# Patient Record
Sex: Male | Born: 1940 | Race: White | Hispanic: No | Marital: Single | Smoking: Never smoker
Health system: Southern US, Community
[De-identification: ages and names within clinical notes are randomized; demographics above are authoritative.]

## PROBLEM LIST (undated history)

## (undated) ENCOUNTER — Emergency Department (HOSPITAL_COMMUNITY): Payer: PPO | Source: Home / Self Care

## (undated) ENCOUNTER — Inpatient Hospital Stay: Admission: EM | Payer: Self-pay | Source: Home / Self Care

## (undated) DIAGNOSIS — R479 Unspecified speech disturbances: Secondary | ICD-10-CM

## (undated) DIAGNOSIS — I10 Essential (primary) hypertension: Secondary | ICD-10-CM

## (undated) DIAGNOSIS — E039 Hypothyroidism, unspecified: Secondary | ICD-10-CM

## (undated) DIAGNOSIS — D494 Neoplasm of unspecified behavior of bladder: Secondary | ICD-10-CM

## (undated) DIAGNOSIS — G479 Sleep disorder, unspecified: Secondary | ICD-10-CM

## (undated) DIAGNOSIS — K219 Gastro-esophageal reflux disease without esophagitis: Secondary | ICD-10-CM

## (undated) DIAGNOSIS — C801 Malignant (primary) neoplasm, unspecified: Secondary | ICD-10-CM

## (undated) DIAGNOSIS — E7800 Pure hypercholesterolemia, unspecified: Secondary | ICD-10-CM

## (undated) DIAGNOSIS — R39198 Other difficulties with micturition: Secondary | ICD-10-CM

## (undated) DIAGNOSIS — G4730 Sleep apnea, unspecified: Secondary | ICD-10-CM

---

## 1979-03-14 HISTORY — PX: HERNIA REPAIR: SHX51

## 1989-03-13 HISTORY — PX: RADIOACTIVE SEED IMPLANT: SHX5150

## 2000-07-12 ENCOUNTER — Other Ambulatory Visit: Admission: RE | Admit: 2000-07-12 | Discharge: 2000-07-12 | Payer: Self-pay | Admitting: Urology

## 2000-07-12 ENCOUNTER — Encounter (INDEPENDENT_AMBULATORY_CARE_PROVIDER_SITE_OTHER): Payer: Self-pay | Admitting: Specialist

## 2000-08-03 ENCOUNTER — Encounter: Admission: RE | Admit: 2000-08-03 | Discharge: 2000-11-01 | Payer: Self-pay | Admitting: Radiation Oncology

## 2000-09-06 ENCOUNTER — Encounter: Payer: Self-pay | Admitting: Radiation Oncology

## 2000-10-26 ENCOUNTER — Encounter: Payer: Self-pay | Admitting: Urology

## 2000-10-26 ENCOUNTER — Ambulatory Visit (HOSPITAL_BASED_OUTPATIENT_CLINIC_OR_DEPARTMENT_OTHER): Admission: RE | Admit: 2000-10-26 | Discharge: 2000-10-26 | Payer: Self-pay | Admitting: Urology

## 2000-12-03 ENCOUNTER — Ambulatory Visit (HOSPITAL_COMMUNITY): Admission: RE | Admit: 2000-12-03 | Discharge: 2000-12-03 | Payer: Self-pay | Admitting: Radiation Oncology

## 2001-04-12 ENCOUNTER — Ambulatory Visit: Admission: RE | Admit: 2001-04-12 | Discharge: 2001-07-11 | Payer: Self-pay | Admitting: Radiation Oncology

## 2009-09-20 ENCOUNTER — Encounter (INDEPENDENT_AMBULATORY_CARE_PROVIDER_SITE_OTHER): Payer: Self-pay | Admitting: *Deleted

## 2009-09-30 ENCOUNTER — Encounter (INDEPENDENT_AMBULATORY_CARE_PROVIDER_SITE_OTHER): Payer: Self-pay

## 2009-10-01 ENCOUNTER — Ambulatory Visit: Payer: Self-pay | Admitting: Gastroenterology

## 2009-10-04 ENCOUNTER — Telehealth (INDEPENDENT_AMBULATORY_CARE_PROVIDER_SITE_OTHER): Payer: Self-pay | Admitting: *Deleted

## 2010-07-13 HISTORY — PX: OTHER SURGICAL HISTORY: SHX169

## 2010-08-12 NOTE — Miscellaneous (Signed)
Summary: Lec previsit  Clinical Lists Changes  Medications: Added new medication of MOVIPREP 100 GM  SOLR (PEG-KCL-NACL-NASULF-NA ASC-C) As per prep instructions. - Signed Rx of MOVIPREP 100 GM  SOLR (PEG-KCL-NACL-NASULF-NA ASC-C) As per prep instructions.;  #1 x 0;  Signed;  Entered by: Ulis Rias RN;  Authorized by: Louis Meckel MD;  Method used: Electronically to [facility] [id number]*, [address] [place], [place]  [address], Ph: [phone], Fax: [phone] Observations: Added new observation of NKA: T (10/01/2009 14:27)    Prescriptions: MOVIPREP 100 GM  SOLR (PEG-KCL-NACL-NASULF-NA ASC-C) As per prep instructions.  #1 x 0   Entered by:   Ulis Rias RN   Authorized by:   Louis Meckel MD   Signed by:   Ulis Rias RN on 10/01/2009   Method used:   Electronically to        [facility] [phone]* (retail)       [address]       [place], [place]  [address]       Ph: [phone]       Fax: [phone]   RxID:   [id number]

## 2010-08-12 NOTE — Letter (Signed)
Summary: [facility] Instructions  [facility]  [address] [place], [place] [address]   Phone: [phone]  Fax: [phone]       Joe Park    1940/08/31    MRN: [id number]        Procedure Day /Date:  Thursday 10/17/2009     Arrival Time: 9:30 am      Procedure Time:  10:30 am     Location of Procedure:                    _x _   [facility]oscopy Center (4th Floor)                        PREPARATION FOR COLONOSCOPY WITH MOVIPREP   Starting 5 days prior to your procedure Saturday 4/2 do not eat nuts, seeds, popcorn, corn, beans, peas,  salads, or any raw vegetables.  Do not take any fiber supplements (e.g. Metamucil, Citrucel, and Benefiber).  THE DAY BEFORE YOUR PROCEDURE         DATE: Wednesday 4/6  1.  Drink clear liquids the entire day-NO SOLID FOOD  2.  Do not drink anything colored red or purple.  Avoid juices with pulp.  No orange juice.  3.  Drink at least 64 oz. (8 glasses) of fluid/clear liquids during the day to prevent dehydration and help the prep work efficiently.  CLEAR LIQUIDS INCLUDE: Water Jello Ice Popsicles Tea (sugar ok, no milk/cream) Powdered fruit flavored drinks Coffee (sugar ok, no milk/cream) Gatorade Juice: apple, white grape, white cranberry  Lemonade Clear bullion, consomm, broth Carbonated beverages (any kind) Strained chicken noodle soup Hard Candy                             4.  In the morning, mix first dose of MoviPrep solution:    Empty 1 Pouch A and 1 Pouch B into the disposable container    Add lukewarm drinking water to the top line of the container. Mix to dissolve    Refrigerate (mixed solution should be used within 24 hrs)  5.  Begin drinking the prep at 5:00 p.m. The MoviPrep container is divided by 4 marks.   Every 15 minutes drink the solution down to the next mark (approximately 8 oz) until the full liter is complete.   6.  Follow completed prep with 16 oz of clear liquid of your choice  (Nothing red or purple).  Continue to drink clear liquids until bedtime.  7.  Before going to bed, mix second dose of MoviPrep solution:    Empty 1 Pouch A and 1 Pouch B into the disposable container    Add lukewarm drinking water to the top line of the container. Mix to dissolve    Refrigerate  THE DAY OF YOUR PROCEDURE      DATE: Thursday 4/7  Beginning at 5:30 am (5 hours before procedure):         1. Every 15 minutes, drink the solution down to the next mark (approx 8 oz) until the full liter is complete.  2. Follow completed prep with 16 oz. of clear liquid of your choice.    3. You may drink clear liquids until 8:30 am (2 HOURS BEFORE PROCEDURE).   MEDICATION INSTRUCTIONS  Unless otherwise instructed, you should take regular prescription medications with a small sip of water   as early as possible the morning  of your procedure.  Additional medication instructions: Do not take HCTZ am of procedure.         OTHER INSTRUCTIONS  You will need a responsible adult at least 69 years of age to accompany you and drive you home.   This person must remain in the waiting room during your procedure.  Wear loose fitting clothing that is easily removed.  Leave jewelry and other valuables at home.  However, you may wish to bring a book to read or  an iPod/MP3 player to listen to music as you wait for your procedure to start.  Remove all body piercing jewelry and leave at home.  Total time from sign-in until discharge is approximately 2-3 hours.  You should go home directly after your procedure and rest.  You can resume normal activities the  day after your procedure.  The day of your procedure you should not:   Drive   Make legal decisions   Operate machinery   Drink alcohol   Return to work  You will receive specific instructions about eating, activities and medications before you leave.    The above instructions have been reviewed and explained to me by    Ulis Rias RN  October 01, 2009 3:11 PM     I fully understand and can verbalize these instructions _____________________________ Date _________

## 2010-08-12 NOTE — Progress Notes (Signed)
Summary: prep ?  Phone Note Call from Patient Call back at [phone]   Caller: sister-in-law, Joyce Gross Call For: Dr. Arlyce Dice Reason for Call: Talk to Nurse Summary of Call: pt cannot afford prep Initial call taken by: Vallarie Mare,  October 04, 2009 3:27 PM  Follow-up for Phone Call        offered rebate for moviprep.  Sister in law okay with that.  Pt is considering rescheduling colonoscopy because caregiver is sick.  Will call back. If colonoscopy is not cancelled, will mail rebate to pt. Follow-up by: Ezra Sites RN,  October 04, 2009 4:36 PM

## 2010-08-12 NOTE — Letter (Signed)
Summary: Previsit letter  [facility]  [address] [place], [place] [address]   Phone: [phone]  Fax: [phone]       09/20/2009 MRN: [id number]  Joe Park [address] [place], [place]  [address]  Dear Mr. MUSA,  Welcome to the Gastroenterology Division at [facility].    You are scheduled to see a nurse for your pre-procedure visit on 10-01-09 at 3PM on the 3rd floor at [facility], [address]  We ask that you try to arrive at our office 15 minutes prior to your appointment time to allow for check-in.  Your nurse visit will consist of discussing your medical and surgical history, your immediate family medical history, and your medications.    Please bring a complete list of all your medications or, if you prefer, bring the medication bottles and we will list them.  We will need to be aware of both prescribed and over the counter drugs.  We will need to know exact dosage information as well.  If you are on blood thinners (Coumadin, Plavix, Aggrenox, Ticlid, etc.) please call our office today/prior to your appointment, as we need to consult with your physician about holding your medication.   Please be prepared to read and sign documents such as consent forms, a financial agreement, and acknowledgement forms.  If necessary, and with your consent, a friend or relative is welcome to sit-in on the nurse visit with you.  Please bring your insurance card so that we may make a copy of it.  If your insurance requires a referral to see a specialist, please bring your referral form from your primary care physician.  No co-pay is required for this nurse visit.     If you cannot keep your appointment, please call [phone] to cancel or reschedule prior to your appointment date.  This allows [place] the opportunity to schedule an appointment for another patient in need of care.    Thank you for choosing [facility] for your medical needs.  We  appreciate the opportunity to care for you.  Please visit [place] at our website  to learn more about our practice.                     Sincerely.                                                                                                                   The Gastroenterology Division

## 2010-11-28 NOTE — Op Note (Signed)
[name]  Patient:    Joe Park, Joe Park                    MRN: [id number] Proc. Date: 10/26/00 Attending:  Maretta Bees. Vonita Moss, M.D. CC:         Wynn Banker, M.D.  Juluis Mire, M.D.   Operative Report  PREOPERATIVE DIAGNOSIS:  Carcinoma of the prostate.  POSTOPERATIVE DIAGNOSIS:  Carcinoma of the prostate.  PROCEDURE:  Radioactive seed implantation to the prostate and cystoscopy.  SURGEON:  Maretta Bees. Vonita Moss, M.D.  ASSISTANT:  Wynn Banker, M.D.  ANESTHESIA:  General.  INDICATIONS:  This 70 year old gentleman was found to have a PSA of 5.5, and he underwent a needle biopsy of the prostate at the end of December that showed Gleason grade 7 (3+4) in the left lobe of the prostate.  He was counseled about surgery versus radiation therapy, and he elected for radiotherapy as a treatment modality.  He has undergone external beam radiation, now presents for radioactive seed implantation.  DESCRIPTION OF PROCEDURE:  The patient was brought to the operating room and placed in lithotomy position.  The external genitalia and perineum were prepped and draped in the usual fashion.  A Foley catheter and rectal tubes were inserted.  The transrectal ultrasound probe was inserted and images correlated with the simulation ultrasound and treatment planning schema. Stabilizing needles were placed in good position, and then under ultrasound and fluoroscopic control, a total of 74 seeds were implanted utilizing 19 needles.  The seeds were I-125 with 0.238 mCi per seed.  Fluoroscopic and ultrasonic visualization revealed a good-appearing seed distribution and implant.  Foley was then removed and he was cystoscoped, and there was no evidence of seeds in the urethra or bladder.  The Foley catheter was reinserted and the patient taken to the recovery room in good condition after bandaging the perineum.  He tolerated the procedure well. DD:   10/26/00 TD:  10/26/00 Job: [id number]

## 2011-09-07 ENCOUNTER — Other Ambulatory Visit: Payer: Self-pay | Admitting: Urology

## 2011-09-15 ENCOUNTER — Encounter (HOSPITAL_COMMUNITY): Payer: Self-pay | Admitting: Pharmacy Technician

## 2011-09-17 ENCOUNTER — Other Ambulatory Visit: Payer: Self-pay

## 2011-09-17 ENCOUNTER — Ambulatory Visit (HOSPITAL_COMMUNITY)
Admission: RE | Admit: 2011-09-17 | Discharge: 2011-09-17 | Disposition: A | Discharge status: Home / Self Care | Payer: Medicare Other | Source: Ambulatory Visit | Attending: Urology | Admitting: Urology

## 2011-09-17 ENCOUNTER — Encounter (HOSPITAL_COMMUNITY)
Admission: RE | Admit: 2011-09-17 | Discharge: 2011-09-17 | Disposition: A | Discharge status: Home / Self Care | Payer: Medicare Other | Source: Ambulatory Visit | Attending: Urology | Admitting: Urology

## 2011-09-17 ENCOUNTER — Encounter (HOSPITAL_COMMUNITY): Payer: Self-pay

## 2011-09-17 DIAGNOSIS — Z01812 Encounter for preprocedural laboratory examination: Secondary | ICD-10-CM | POA: Insufficient documentation

## 2011-09-17 DIAGNOSIS — Z01810 Encounter for preprocedural cardiovascular examination: Secondary | ICD-10-CM | POA: Insufficient documentation

## 2011-09-17 DIAGNOSIS — Z01818 Encounter for other preprocedural examination: Secondary | ICD-10-CM | POA: Insufficient documentation

## 2011-09-17 HISTORY — DX: Other difficulties with micturition: R39.198

## 2011-09-17 HISTORY — DX: Pure hypercholesterolemia, unspecified: E78.00

## 2011-09-17 HISTORY — DX: Gastro-esophageal reflux disease without esophagitis: K21.9

## 2011-09-17 HISTORY — DX: Sleep disorder, unspecified: G47.9

## 2011-09-17 HISTORY — DX: Essential (primary) hypertension: I10

## 2011-09-17 HISTORY — DX: Neoplasm of unspecified behavior of bladder: D49.4

## 2011-09-17 HISTORY — DX: Malignant (primary) neoplasm, unspecified: C80.1

## 2011-09-17 HISTORY — DX: Hypothyroidism, unspecified: E03.9

## 2011-09-17 LAB — CBC
Platelets: 230 10*3/uL (ref 150–400)
RDW: 13.2 % (ref 11.5–15.5)
WBC: 6.8 10*3/uL (ref 4.0–10.5)

## 2011-09-17 LAB — BASIC METABOLIC PANEL
Chloride: 102 mEq/L (ref 96–112)
GFR calc Af Amer: 85 mL/min — ABNORMAL LOW (ref >90)
Potassium: 3.5 mEq/L (ref 3.5–5.1)
Sodium: 139 mEq/L (ref 135–145)

## 2011-09-17 LAB — SURGICAL PCR SCREEN: Staphylococcus aureus: NEGATIVE

## 2011-09-17 NOTE — Patient Instructions (Signed)
20 Joe Park  09/17/2011   Your procedure is scheduled on:  09/22/11  Report to SHORT STAY DEPT  at 6:45 AM.  Call this number if you have problems the morning of surgery: [phone]   Remember:   Do not eat food or drink liquids AFTER MIDNIGHT  Take these medicines the morning of surgery with A SIP OF WATER: LEVOTHYROXINE   Do not wear jewelry, make-up or nail polish.  Do not wear lotions, powders, or perfumes.   Do not shave legs or underarms 48 hrs. before surgery (men may shave face)  Do not bring valuables to the hospital.  Contacts, dentures or bridgework may not be worn into surgery.  Leave suitcase in the car. After surgery it may be brought to your room.  For patients admitted to the hospital, checkout time is 11:00 AM the day of discharge.   Patients discharged the day of surgery will not be allowed to drive home.  Name and phone number of your driver:   Special Instructions:   Please read over the following fact sheets that you were given: MRSA  Information               SHOWER WITH BETASEPT THE NIGHT BEFORE SURGERY AND THE MORNING OF SURGERY

## 2011-09-21 NOTE — H&P (Signed)
Problems  1. 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 Gross Hematuria 599.71 3. 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 History of  Prostate Cancer V10.46 6. Pyuria 791.9 7. Urge Incontinence Of Urine 788.31  History of Present Illness     [name] returns today in f/u.  He has a history of prostate cancer s/p seed implant in 2002.  His PSA has been low.  He had a bladder stone removed in 8/11.  He returned in January with gross hematuria.  He was seen by [facility] Muta and had a negative culture.  He was started on Tamsulosin for his voiding symptoms.   A CT showed possible bladder stones and a diverticulum, but on my  review there was some bladder wall thickening and I am concerned about neoplasm.  He has had no further gross blood over the last 2 weeks but has pyuria today.  He has an intermittantly slow stream but is emptying well with a PVR of only 29cc.   Past Medical History Problems  1. History of  Bladder Calculus V13.01 2. History of  Gross Hematuria 599.71 3. History of  Heartburn 787.1 4. History of  Hypercholesterolemia 272.0 5. History of  Hypertension 401.9 6. History of  Prostate Cancer V10.46 7. History of  Radiation Therapy V15.3  Surgical History Problems  1. History of  Colonoscopy (Fiberoptic) 2. History of  Inguinal Hernia Repair 3. History of  Umbilical Hernia Repair  Current Meds 1. Aspirin 81 MG Oral Tablet; Therapy: (Recorded:30Aug2011) to 2. Hydrochlorothiazide 12.5 MG Oral Capsule; Therapy: (Recorded:30Jan2013) to 3. Levothyroxine Sodium 125 MCG Oral Tablet; Therapy: 27Sep2011 to 4. Lisinopril 40 MG Oral Tablet; Therapy: (Recorded:30Jan2013) to 5. Multi-Vitamin TABS; Therapy: (Recorded:13May2008) to 6. Simvastatin 40 MG Oral Tablet; Therapy: (Recorded:30Jan2013) to 7. Tamsulosin HCl 0.4 MG Oral Capsule; TAKE 1 CAPSULE BY MOUTH  DAILY; Therapy:  30Jan2013 to (Last Rx:30Jan2013)  Requested for: 30Jan2013 8. Vitamin D TABS; Therapy:  (Recorded:30Aug2011) to  Allergies Medication  1. No Known Drug Allergies No Known Allergies  2. No Known Allergies  Family History Problems  1. Maternal history of  Cardiac Failure 2. Paternal history of  Death In The Family Father EmphysemaAge 47 3. Maternal history of  Death In The Family Mother Heart FailureAge 81 4. Paternal history of  Emphysema  Social History Problems  1. Caffeine Use Five per day 2. Marital History - Single 3. Never A Smoker 4. Retired From Work Denied  5. Alcohol Use 6. History of  Tobacco Use   Past, social and family history reviewed and updated.   Review of Systems Genitourinary, constitutional, skin, eye, otolaryngeal, hematologic/lymphatic, cardiovascular, pulmonary, endocrine, musculoskeletal, gastrointestinal, neurological and psychiatric system(s) were reviewed and pertinent findings if present are noted.  Genitourinary: feelings of urinary urgency, dysuria, incontinence (with urge) and weak urinary stream, but no hematuria.  Cardiovascular: no chest pain.  Respiratory: no shortness of breath.    Physical Exam Constitutional: Well nourished and well developed . No acute distress.  ENT:. The ears and nose are normal in appearance.  Neck: The appearance of the neck is normal and no neck mass is present.  Pulmonary: No respiratory distress and normal respiratory rhythm and effort.  Cardiovascular: Heart rate and rhythm are normal . No peripheral edema.  Abdomen: The abdomen is soft and nontender. No masses are palpated. No CVA tenderness. No hernias are palpable. No hepatosplenomegaly noted.  Genitourinary: Examination of the penis demonstrates no discharge, no masses, no lesions and a normal meatus. The  scrotum is without lesions. The right epididymis is palpably normal and non-tender. The left epididymis is palpably normal and non-tender. The right testis is non-tender and without masses. The left testis is non-tender and without masses.    Lymphatics: The supraclavicular and axillary nodes are not enlarged or tender.  Skin: Normal skin turgor, no visible rash and no visible skin lesions.  Neuro/Psych:. Mood and affect are appropriate.    Results/Data Urine [Data Includes: Last 1 Day]   25Feb2013  COLOR YELLOW   APPEARANCE CLOUDY   SPECIFIC GRAVITY 1.020   pH 7.0   GLUCOSE NEG mg/dL  BILIRUBIN NEG   KETONE NEG mg/dL  BLOOD LARGE   PROTEIN 100 mg/dL  UROBILINOGEN 1 mg/dL  NITRITE NEG   LEUKOCYTE ESTERASE MOD   SQUAMOUS EPITHELIAL/HPF RARE   WBC TNTC WBC/hpf  RBC 11-20 RBC/hpf  BACTERIA FEW   CRYSTALS NONE SEEN   CASTS NONE SEEN    The following images/tracing/specimen were independently visualized:  I have reviewed his recent CT films.  The following clinical lab reports were reviewed:  I have reviewed his recent culture and renal function studies.  PVR: Ultrasound PVR 29 ml. Selected Results  AU CT-HEMATURIA PROTOCOL 05Feb2013 12:00AM Jetta Lout   Test Name Result Flag Reference  ** RADIOLOGY REPORT BY [facility] ** ORIGINAL APPROVED BY: Danae Orleans, M.D. ON: 08/18/2011 11:30:32   *RADIOLOGY REPORT*  Clinical Data: Painless gross hematuria. Prostate carcinoma.  CT ABDOMEN AND PELVIS WITHOUT AND WITH CONTRAST  Technique: Multidetector CT imaging of the abdomen and pelvis was performed without contrast material in one or both body regions, followed by contrast material(s) and further sections in one or both body regions.  Contrast: 125 ml Isovue 300  Comparison: 02/17/2010  Findings: No evidence of renal calculi or hydronephrosis. No evidence of ureteral calculi or dilatation.  A tiny sub-centimeter cyst or angiomyolipoma in the anterior mid pole right kidney is stable. Several other tiny sub-centimeter low attenuation renal lesions, which are too small to characterize, are also unchanged. No suspicious solid or complex cystic masses are seen involving either kidney. Delayed  images show no evidence of filling defects within the renal collecting systems or ureters.  The bladder shows several intraluminal calcifications measuring up to 6 mm, consistent with calculi. There is a fluid attenuation cystic lesion seen in the posterior aspect the bladder which measures 4.5 x 4.6 cm. This shows filling with urinary contrast during delayed excretory phase, consistent with some type of bladder cyst.  No solid or enhancing bladder masses are identified. Brachytherapy seeds are seen throughout the prostate gland. Seminal vesicles are unremarkable in appearance.  No soft tissue masses or lymphadenopathy seen within the abdomen or pelvis. The liver, gallbladder, pancreas, adrenal glands, and pancreas are normal in appearance. No evidence of acute inflammatory process or abscess. Mild diverticulosis of the sigmoid colon seen, however there is no evidence of diverticulitis. No suspicious bone lesions are identified. L2 vertebral body superior endplate compression fracture is seen which is new since previous study 2011, but does not appear acute.  IMPRESSION:  1. 4.6 cm fluid attenuation cyst in the posterior urinary bladder, which shows communication with the main bladder lumen on excretory phase. No solid bladder neoplasm identified. Consider cystoscopy for further evaluation. 2. Several small intraluminal bladder calculi measuring up to 6 mm. 3. No evidence of renal cell carcinoma, ureteral calculi, or hydronephrosis. 4. 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 No evidence of bone metastasis.  PSA 30Jan2013 10:25AM Jetta Lout  SPECIMEN TYPE: BLOOD   Test Name Result Flag Reference  PSA 0.02 ng/mL  <=4.00  Test Methodology: ECLIA PSA (Electrochemiluminescence Immunoassay)   BUN & CREATININE 30Jan2013 10:25AM Jetta Lout  SPECIMEN TYPE: BLOOD   Test Name Result Flag Reference  CREATININE 1.09 mg/dL  1.61-0.96  BUN  21 mg/dL  0-45  Est GFR, African American 80 mL/min    Est GFR, NonAfrican American 69 mL/min    The estimated GFR is a calculation valid for adults (32 to 70 years old) that uses the CKD-EPI algorithm to adjust for age and sex. It is not to be used for children, pregnant women, hospitalized patients, patients on dialysis, or with rapidly changing kidney function. According to the NKDEP, eGFR >89 is normal, 60-89 shows mild impairment, 30-59 shows moderate impairment, 15-29 shows severe impairment and <15 is ESRD.   URINE CULTURE 30Jan2013 10:21AM Jetta Lout  Source : CLEAN CATCH SPECIMEN TYPE: CLEAN CATCH   Test Name Result Flag Reference  CULTURE, URINE Culture, Urine    ===== COLONY COUNT: =====  NO GROWTH   FINAL REPORT: NO GROWTH   Procedure  Procedure: Cystoscopy  Indication: Hematuria. Lower Urinary Tract Symptoms.  Informed Consent: Risks, benefits, and potential adverse events were discussed and informed consent was obtained from the patient.  Prep: The patient was prepped with betadine.  Antibiotic prophylaxis: Ciprofloxacin.  Procedure Note:  Urethral meatus:. No abnormalities.  Anterior urethra: No abnormalities.  Prostatic urethra:. Estimated length was 3 cm. There was visual obstruction of the prostatic urethra. The lateral and median prostatic lobes were enlarged. An enlarged intravesical median lobe was visualized. With blanching from radiation therapy.  Bladder: Visulization was obscured due to cloudy urine. The ureteral orifices were not able to be identified. (There is a solid sessile appearing lesion that encircles the bladder neck sparing only a portion of the trigone and extending about half way back into the bladder. There is adherent dystrophic calcification) The patient tolerated the procedure well.  Complications: None.    Assessment Assessed  1. Gross Hematuria 599.71 2. 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 History of  Prostate Cancer V10.46 4.  Pyuria 791.9   He has a bladder mass that is probably a bladder primary but a poorly differentiated prostate cancer is a possibility as well.  the disease is extensive and probably invasive. He has trilobar prostate anatomy with some obstruction but I believe his symptoms are from the tumor.   Plan Health Maintenance (V70.0)  1. UA With REFLEX  Done: 25Feb2013 08:25AM   I am going to set him up for a TURBT.  I have reviewed the risks of bleeding, infection, bladder wall and ureteral injury, voiding difficulty, thrombotic events and anesthetic complications. I will do an EUA at the time as well. Urine culture today.

## 2011-09-21 NOTE — Discharge Instructions (Addendum)
Transurethral Resection, Bladder Tumor A cancerous growth (tumor) can develop on the inside wall of the bladder. The bladder is the organ that holds urine. One way to remove the tumor is a procedure called a transurethral resection. The tumor is removed (resected) through the tube that carries urine from the bladder out of the body (urethra). No cuts (incisions) are made in the skin. Instead, the procedure is done through a thin telescope, called a resectoscope. Attached to it is a light and usually a tiny camera. The resectoscope is put into the urethra. In men, the urethra opens at the end of the penis. In women, it opens just above the vagina.  A transurethral resection is usually used to remove tumors that have not gotten too big or too deep. These are called Stage 0, Stage 1 or Stage 2 bladder cancers. LET YOUR CAREGIVER KNOW ABOUT:  On the day of the procedure, your caregivers will need to know the last time you had anything to eat or drink. This includes water, gum, and candy. In advance, make sure they know about:   Any allergies.   All medications you are taking, including:   Herbs, eyedrops, over-the-counter medications and creams.   Blood thinners (anticoagulants), aspirin or other drugs that could affect blood clotting.   Use of steroids (by mouth or as creams).   Previous problems with anesthetics, including local anesthetics.   Possibility of pregnancy, if this applies.   Any history of blood clots.   Any history of bleeding or other blood problems.   Previous surgery.   Smoking history.   Any recent symptoms of colds or infections.   Other health problems.  RISKS AND COMPLICATIONS This is usually a safe procedure. Every procedure has risks, though. For a transurethral resection, they include:  Infection. Antibiotic medication would need to be taken.   Bleeding.   Light bleeding may last for several days after the procedure.   If bleeding continues or is heavy,  the bladder may need rinsing. Or, a new catheter might be put in for awhile.   Sometimes bed rest is needed.   Urination problems.   Pain and burning can occur when urinating. This usually goes away in a few days.   Scarring from the procedure can block the flow of urine.   Bladder damage.   It can be punctured or torn during removal of the tumor. If this happens, a catheter might be needed for longer. Antibiotics would be taken while the bladder heals.   Urine can leak through the hole or tear into the abdomen. If this happens, surgery may be needed to repair the bladder.  BEFORE THE PROCEDURE   A medical evaluation will be done. This may include:   A physical examination.   Urine test. This is to make sure you do not have a urinary tract infection.   Blood tests.   A test that checks the heart's rhythm (electrocardiogram).   Talking with an anesthesiologist. This is the person who will be in charge of the medication (anesthesia) to keep you from feeling pain during the transurethral resection. You might be asleep during the procedure (general anesthesia) or numb from the waist down, but awake during the procedure (spinal anesthesia). Ask your surgeon what to expect.   The person who is having a transurethral resection needs to give what is called informed consent. This requires signing a legal paper that gives permission for the procedure. To give informed consent:   You must  understand how the procedure is done and why.   You must be told all the risks and benefits of the procedure.   You must sign the consent. Sometimes a legal guardian can do this.   Signing should be witnessed by a healthcare professional.   The day before the surgery, eat only a light dinner. Then, do not eat or drink anything for at least 8 hours before the surgery. Ask your caregiver if it is OK to take any needed medicines with a sip of water.   Arrive at least an hour before the surgery or whenever  your surgeon recommends. This will give you time to check in and fill out any needed paperwork.  PROCEDURE  The preparation:   You will change into a hospital gown.   A needle will be inserted in your arm. This is an intravenous access tube (IV). Medication will be able to flow directly into your body through this needle.   Small monitors will be put on your body. They are used to check your heart, blood pressure, and oxygen level.   You might be given medication that will help you relax (sedative).   You will be given a general anesthetic or spinal anesthesia.   The procedure:   Once you are asleep or numb from the waist down, your legs will be placed in stirrups.   The resectoscope will be passed through the urethra into the bladder.   Fluid will be passed through the resectoscope. This will fill the bladder with water.   The surgeon will examine the bladder through the scope. If the scope has a camera, it can take pictures from inside the bladder. They can be projected onto a TV screen.   The surgeon will use various tools to remove the tumor in small pieces. Sometimes a laser (a beam of light energy) is used. Other tools may use electric current.   A tube (catheter) will often be placed so that urine can drain into a bag outside the body. This process helps stop bleeding. This tube keeps blood clots from blocking the urethra.   The procedure usually takes 30 to 45 minutes.  AFTER THE PROCEDURE   You will stay in a recovery area until the anesthesia has worn off. Your blood pressure and pulse will be checked every so often. Then you will be taken to a hospital room.   You may continue to get fluids through the IV for awhile.   Some pain is normal. The catheter might be uncomfortable. Pain is usually not severe. If it is, ask for pain medicine.   Your urine may look bloody after a transurethral resection. This is normal.   If bleeding is heavy, a hospital caregiver may rinse  out the bladder (irrigation) through the catheter.   Once the urine is clear, the catheter will be taken out.   You will need to stay in the hospital until you can urinate on your own.   Most people stay in the hospital for up to 4 days.  PROGNOSIS   Transurethral resection is considered the best way to treat bladder tumors that are not too far along. For most people, the treatment is successful. Sometimes, though, more treatment is needed.   Bladder cancers can come back even after a successful procedure. Because of this, be sure to have a checkup with your caregiver every 3 to 6 months. If everything is OK for 3 years, you can reduce the checkups to once a  year.  Document Released: 04/25/2009 Document Revised: 06/18/2011 Document Reviewed: 04/25/2009 [facility] Patient Information 2012 [place], [place].   Please hold aspirin for a week.

## 2011-09-22 ENCOUNTER — Encounter (HOSPITAL_COMMUNITY): Payer: Self-pay | Admitting: *Deleted

## 2011-09-22 ENCOUNTER — Observation Stay (HOSPITAL_COMMUNITY)
Admission: RE | Admit: 2011-09-22 | Discharge: 2011-09-23 | Disposition: A | Discharge status: Home / Self Care | Payer: Medicare Other | Source: Ambulatory Visit | Attending: Urology | Admitting: Urology

## 2011-09-22 ENCOUNTER — Ambulatory Visit (HOSPITAL_COMMUNITY): Payer: Medicare Other | Admitting: *Deleted

## 2011-09-22 ENCOUNTER — Encounter (HOSPITAL_COMMUNITY)
Admission: RE | Disposition: A | Discharge status: Home / Self Care | Payer: Self-pay | Source: Ambulatory Visit | Attending: Urology

## 2011-09-22 DIAGNOSIS — Z01812 Encounter for preprocedural laboratory examination: Secondary | ICD-10-CM | POA: Insufficient documentation

## 2011-09-22 DIAGNOSIS — N3941 Urge incontinence: Secondary | ICD-10-CM | POA: Insufficient documentation

## 2011-09-22 DIAGNOSIS — R82998 Other abnormal findings in urine: Secondary | ICD-10-CM | POA: Insufficient documentation

## 2011-09-22 DIAGNOSIS — Z8546 Personal history of malignant neoplasm of prostate: Secondary | ICD-10-CM | POA: Insufficient documentation

## 2011-09-22 DIAGNOSIS — Z79899 Other long term (current) drug therapy: Secondary | ICD-10-CM | POA: Insufficient documentation

## 2011-09-22 DIAGNOSIS — R310 Gross hematuria: Secondary | ICD-10-CM | POA: Insufficient documentation

## 2011-09-22 DIAGNOSIS — Z7982 Long term (current) use of aspirin: Secondary | ICD-10-CM | POA: Insufficient documentation

## 2011-09-22 DIAGNOSIS — E7800 Pure hypercholesterolemia, unspecified: Secondary | ICD-10-CM | POA: Insufficient documentation

## 2011-09-22 DIAGNOSIS — R3129 Other microscopic hematuria: Secondary | ICD-10-CM | POA: Insufficient documentation

## 2011-09-22 DIAGNOSIS — C679 Malignant neoplasm of bladder, unspecified: Principal | ICD-10-CM | POA: Insufficient documentation

## 2011-09-22 DIAGNOSIS — Z923 Personal history of irradiation: Secondary | ICD-10-CM | POA: Insufficient documentation

## 2011-09-22 DIAGNOSIS — I10 Essential (primary) hypertension: Secondary | ICD-10-CM | POA: Insufficient documentation

## 2011-09-22 DIAGNOSIS — N529 Male erectile dysfunction, unspecified: Secondary | ICD-10-CM | POA: Insufficient documentation

## 2011-09-22 DIAGNOSIS — N320 Bladder-neck obstruction: Secondary | ICD-10-CM | POA: Insufficient documentation

## 2011-09-22 HISTORY — PX: TRANSURETHRAL RESECTION OF BLADDER TUMOR: SHX2575

## 2011-09-22 SURGERY — TURBT (TRANSURETHRAL RESECTION OF BLADDER TUMOR)
Anesthesia: General

## 2011-09-22 MED ORDER — FENTANYL CITRATE 0.05 MG/ML IJ SOLN
INTRAMUSCULAR | Status: DC | PRN
  Administered 2011-09-22 (×2): 100 ug via INTRAVENOUS
  Administered 2011-09-22: 50 ug via INTRAVENOUS

## 2011-09-22 MED ORDER — FENTANYL CITRATE 0.05 MG/ML IJ SOLN
25.0000 ug | INTRAMUSCULAR | Status: DC | PRN
  Administered 2011-09-22 (×2): 50 ug via INTRAVENOUS

## 2011-09-22 MED ORDER — DOCUSATE SODIUM 100 MG PO CAPS
100.0000 mg | ORAL_CAPSULE | Freq: Two times a day (BID) | ORAL | Status: DC
  Administered 2011-09-22 – 2011-09-23 (×2): 100 mg via ORAL
  Filled 2011-09-22 (×3): qty 1

## 2011-09-22 MED ORDER — LACTATED RINGERS IV SOLN
INTRAVENOUS | Status: DC | PRN
  Administered 2011-09-22: 09:00:00 via INTRAVENOUS

## 2011-09-22 MED ORDER — PROMETHAZINE HCL 25 MG/ML IJ SOLN: 6.2500 mg | INTRAMUSCULAR | Status: DC | PRN

## 2011-09-22 MED ORDER — ONDANSETRON HCL 4 MG/2ML IJ SOLN
INTRAMUSCULAR | Status: DC | PRN
  Administered 2011-09-22: 4 mg via INTRAVENOUS

## 2011-09-22 MED ORDER — ACETAMINOPHEN 10 MG/ML IV SOLN
INTRAVENOUS | Status: DC | PRN
  Administered 2011-09-22: 1000 mg via INTRAVENOUS

## 2011-09-22 MED ORDER — SODIUM CHLORIDE 0.9 % IR SOLN
Status: DC | PRN
  Administered 2011-09-22: 3000 mL

## 2011-09-22 MED ORDER — CIPROFLOXACIN IN D5W 400 MG/200ML IV SOLN
INTRAVENOUS | Status: AC
  Filled 2011-09-22: qty 200

## 2011-09-22 MED ORDER — LACTATED RINGERS IV SOLN: INTRAVENOUS | Status: DC

## 2011-09-22 MED ORDER — LIDOCAINE HCL 1 % IJ SOLN
INTRAMUSCULAR | Status: DC | PRN
  Administered 2011-09-22: 50 mg via INTRADERMAL

## 2011-09-22 MED ORDER — HYDROMORPHONE HCL PF 1 MG/ML IJ SOLN: 0.5000 mg | INTRAMUSCULAR | Status: DC | PRN

## 2011-09-22 MED ORDER — LISINOPRIL 40 MG PO TABS
40.0000 mg | ORAL_TABLET | Freq: Every day | ORAL | Status: DC
  Administered 2011-09-23: 40 mg via ORAL
  Filled 2011-09-22: qty 1

## 2011-09-22 MED ORDER — BISACODYL 10 MG RE SUPP: 10.0000 mg | Freq: Every day | RECTAL | Status: DC | PRN

## 2011-09-22 MED ORDER — HYDROCODONE-ACETAMINOPHEN 5-325 MG PO TABS
1.0000 | ORAL_TABLET | ORAL | Status: DC | PRN
  Administered 2011-09-22 (×2): 1 via ORAL
  Filled 2011-09-22 (×2): qty 1

## 2011-09-22 MED ORDER — CIPROFLOXACIN HCL 500 MG PO TABS
500.0000 mg | ORAL_TABLET | Freq: Two times a day (BID) | ORAL | Status: DC
  Administered 2011-09-22 – 2011-09-23 (×3): 500 mg via ORAL
  Filled 2011-09-22 (×4): qty 1

## 2011-09-22 MED ORDER — HYDROCHLOROTHIAZIDE 25 MG PO TABS
12.5000 mg | ORAL_TABLET | Freq: Every morning | ORAL | Status: DC
  Administered 2011-09-22 – 2011-09-23 (×2): 12.5 mg via ORAL
  Filled 2011-09-22 (×2): qty 0.5

## 2011-09-22 MED ORDER — SIMVASTATIN 40 MG PO TABS
40.0000 mg | ORAL_TABLET | Freq: Every day | ORAL | Status: DC
  Administered 2011-09-22: 40 mg via ORAL
  Filled 2011-09-22 (×2): qty 1

## 2011-09-22 MED ORDER — ACETAMINOPHEN 325 MG PO TABS: 650.0000 mg | ORAL_TABLET | ORAL | Status: DC | PRN

## 2011-09-22 MED ORDER — MIDAZOLAM HCL 5 MG/5ML IJ SOLN
INTRAMUSCULAR | Status: DC | PRN
  Administered 2011-09-22 (×2): 1 mg via INTRAVENOUS

## 2011-09-22 MED ORDER — LACTATED RINGERS IV SOLN
INTRAVENOUS | Status: DC
  Administered 2011-09-22: 1000 mL via INTRAVENOUS

## 2011-09-22 MED ORDER — PROPOFOL 10 MG/ML IV BOLUS
INTRAVENOUS | Status: DC | PRN
  Administered 2011-09-22: 225 mg via INTRAVENOUS

## 2011-09-22 MED ORDER — LEVOTHYROXINE SODIUM 125 MCG PO TABS
125.0000 ug | ORAL_TABLET | Freq: Every day | ORAL | Status: DC
  Administered 2011-09-23: 125 ug via ORAL
  Filled 2011-09-22: qty 1

## 2011-09-22 MED ORDER — CIPROFLOXACIN IN D5W 400 MG/200ML IV SOLN
400.0000 mg | INTRAVENOUS | Status: AC
  Administered 2011-09-22: 400 mg via INTRAVENOUS

## 2011-09-22 MED ORDER — KCL IN DEXTROSE-NACL 20-5-0.45 MEQ/L-%-% IV SOLN
INTRAVENOUS | Status: DC
  Administered 2011-09-22: 12:00:00 via INTRAVENOUS
  Filled 2011-09-22 (×5): qty 1000

## 2011-09-22 MED ORDER — HYOSCYAMINE SULFATE 0.125 MG SL SUBL
0.1250 mg | SUBLINGUAL_TABLET | SUBLINGUAL | Status: DC | PRN
  Filled 2011-09-22: qty 1

## 2011-09-22 MED ORDER — KCL IN DEXTROSE-NACL 20-5-0.45 MEQ/L-%-% IV SOLN
INTRAVENOUS | Status: AC
  Filled 2011-09-22: qty 1000

## 2011-09-22 MED ORDER — MIRTAZAPINE 15 MG PO TABS
15.0000 mg | ORAL_TABLET | Freq: Every day | ORAL | Status: DC
  Administered 2011-09-22: 15 mg via ORAL
  Filled 2011-09-22 (×2): qty 1

## 2011-09-22 MED ORDER — FENTANYL CITRATE 0.05 MG/ML IJ SOLN
INTRAMUSCULAR | Status: AC
  Administered 2011-09-22: 11:00:00
  Filled 2011-09-22: qty 2

## 2011-09-22 MED ORDER — ZOLPIDEM TARTRATE 5 MG PO TABS
5.0000 mg | ORAL_TABLET | Freq: Every evening | ORAL | Status: DC | PRN
  Administered 2011-09-22: 5 mg via ORAL
  Filled 2011-09-22: qty 1

## 2011-09-22 MED ORDER — ONDANSETRON HCL 4 MG/2ML IJ SOLN: 4.0000 mg | INTRAMUSCULAR | Status: DC | PRN

## 2011-09-22 MED ORDER — ACETAMINOPHEN 10 MG/ML IV SOLN
INTRAVENOUS | Status: AC
  Filled 2011-09-22: qty 100

## 2011-09-22 SURGICAL SUPPLY — 29 items
BAG URINE DRAINAGE (UROLOGICAL SUPPLIES) ×1 IMPLANT
BAG URO CATCHER STRL LF (DRAPE) ×2 IMPLANT
CATH FOLEY 2WAY SLVR  5CC 22FR (CATHETERS) ×1
CATH FOLEY 2WAY SLVR 5CC 22FR (CATHETERS) IMPLANT
CATH FOLEY 3WAY 30CC 22FR (CATHETERS) IMPLANT
CLOTH BEACON ORANGE TIMEOUT ST (SAFETY) ×2 IMPLANT
DRAPE CAMERA CLOSED 9X96 (DRAPES) ×2 IMPLANT
ELECT LOOP MED HF 24F 12D (CUTTING LOOP) ×1 IMPLANT
ELECT REM PT RETURN 9FT ADLT (ELECTROSURGICAL) ×2
ELECT RESECT VAPORIZE 12D CBL (ELECTRODE) ×1 IMPLANT
ELECTRODE KNIFE URO 27FR PED (UROLOGICAL SUPPLIES) ×1 IMPLANT
ELECTRODE REM PT RTRN 9FT ADLT (ELECTROSURGICAL) ×1 IMPLANT
GLOVE SURG SS PI 8.0 STRL IVOR (GLOVE) ×2 IMPLANT
GOWN PREVENTION PLUS XLARGE (GOWN DISPOSABLE) ×2 IMPLANT
GOWN STRL REIN XL XLG (GOWN DISPOSABLE) ×2 IMPLANT
GUIDEWIRE STR DUAL SENSOR (WIRE) ×1 IMPLANT
HOLDER FOLEY CATH W/STRAP (MISCELLANEOUS) ×1 IMPLANT
KIT ASPIRATION TUBING (SET/KITS/TRAYS/PACK) ×1 IMPLANT
KNIFE COLLINS 24FR (ELECTROSURGICAL) IMPLANT
LOOPS CUTTING 24FR (ELECTROSURGICAL) IMPLANT
LOOPS RESECTOSCOPE DISP (ELECTROSURGICAL) ×1 IMPLANT
MANIFOLD NEPTUNE II (INSTRUMENTS) ×2 IMPLANT
MARKER SKIN DUAL TIP RULER LAB (MISCELLANEOUS) ×1 IMPLANT
PACK CYSTO (CUSTOM PROCEDURE TRAY) ×2 IMPLANT
ROLLER BALL 3MM 27FR (ELECTROSURGICAL) IMPLANT
SUT ETHILON 3 0 PS 1 (SUTURE) IMPLANT
SYR 30ML LL (SYRINGE) IMPLANT
SYRINGE IRR TOOMEY STRL 70CC (SYRINGE) IMPLANT
TUBING CONNECTING 10 (TUBING) ×2 IMPLANT

## 2011-09-22 NOTE — Brief Op Note (Signed)
09/22/2011  10:02 AM  PATIENT:  Valentina Lucks  71 y.o. 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  DISPOSITION OF SPECIMEN:  PATHOLOGY  COUNTS:  YES  TOURNIQUET:  * No tourniquets in log *  DICTATION: .Other Dictation: Dictation Number [id number]  PLAN OF CARE: Admit for overnight observation  PATIENT DISPOSITION:  PACU - hemodynamically stable.   Delay start of Pharmacological VTE agent (>24hrs) due to surgical blood loss or risk of bleeding: yes

## 2011-09-22 NOTE — Anesthesia Preprocedure Evaluation (Addendum)
Anesthesia Evaluation  Patient identified by MRN, date of birth, ID band Patient awake    Reviewed: Allergy & Precautions, H&P , NPO status , Patient's Chart, lab work & pertinent test results  Airway Mallampati: II TM Distance: >3 FB Neck ROM: Full    Dental  (+) Teeth Intact, Poor Dentition, Chipped and Dental Advisory Given,    Pulmonary neg pulmonary ROS,  breath sounds clear to auscultation  Pulmonary exam normal       Cardiovascular hypertension, Pt. on medications Rhythm:Regular Rate:Normal     Neuro/Psych negative neurological ROS  negative psych ROS   GI/Hepatic Neg liver ROS, GERD-  Medicated,  Endo/Other  Hypothyroidism   Renal/GU negative Renal ROS  negative genitourinary   Musculoskeletal negative musculoskeletal ROS (+)   Abdominal   Peds negative pediatric ROS (+)  Hematology negative hematology ROS (+)   Anesthesia Other Findings   Reproductive/Obstetrics negative OB ROS                          Anesthesia Physical Anesthesia Plan  ASA: II  Anesthesia Plan: General   Post-op Pain Management:    Induction: Intravenous  Airway Management Planned: LMA  Additional Equipment:   Intra-op Plan:   Post-operative Plan: Extubation in OR  Informed Consent: I have reviewed the patients History and Physical, chart, labs and discussed the procedure including the risks, benefits and alternatives for the proposed anesthesia with the patient or authorized representative who has indicated his/her understanding and acceptance.   Dental advisory given  Plan Discussed with: CRNA  Anesthesia Plan Comments:         Anesthesia Quick Evaluation

## 2011-09-22 NOTE — Progress Notes (Signed)
Patient ID: Joe Park, male   DOB: [birth year], 71 y.o.   MRN: [id number] Mr. Austad is doing well post op and has a good UOP with pink tinged urine.

## 2011-09-22 NOTE — Anesthesia Postprocedure Evaluation (Signed)
Anesthesia Post Note  Patient: Joe Park  Procedure(s) Performed: Procedure(s) (LRB): TRANSURETHRAL RESECTION OF BLADDER TUMOR (TURBT) (N/A)  Anesthesia type: General  Patient location: PACU  Post pain: Pain level controlled  Post assessment: Post-op Vital signs reviewed  Last Vitals:  Filed Vitals:   09/22/11 1100  BP: 119/74  Pulse: 61  Temp: 36.5 C  Resp: 13    Post vital signs: Reviewed  Level of consciousness: sedated  Complications: No apparent anesthesia complications

## 2011-09-22 NOTE — Transfer of Care (Signed)
Immediate Anesthesia Transfer of Care Note  Patient: Joe Park  Procedure(s) Performed: Procedure(s) (LRB): TRANSURETHRAL RESECTION OF BLADDER TUMOR (TURBT) (N/A)  Patient Location: PACU  Anesthesia Type: General  Level of Consciousness: oriented, sedated and patient cooperative  Airway & Oxygen Therapy: Patient Spontanous Breathing and Patient connected to face mask oxygen  Post-op Assessment: Report given to PACU RN, Post -op Vital signs reviewed and stable and Patient moving all extremities  Post vital signs: Reviewed and stable  Complications: No apparent anesthesia complications

## 2011-09-22 NOTE — Interval H&P Note (Signed)
History and Physical Interval Note:  09/22/2011 8:54 AM  Joe Park  has presented today for surgery, with the diagnosis of bladder tumor  The various methods of treatment have been discussed with the patient and family. After consideration of risks, benefits and other options for treatment, the patient has consented to  Procedure(s) (LRB): TRANSURETHRAL RESECTION OF BLADDER TUMOR (TURBT) (N/A) as a surgical intervention .  The patients' history has been reviewed, patient examined, no change in status, stable for surgery.  I have reviewed the patients' chart and labs.  Questions were answered to the patient's satisfaction.     Kayley Zeiders J

## 2011-09-23 LAB — BASIC METABOLIC PANEL
GFR calc Af Amer: 78 mL/min — ABNORMAL LOW (ref >90)
GFR calc non Af Amer: 67 mL/min — ABNORMAL LOW (ref >90)
Glucose, Bld: 121 mg/dL — ABNORMAL HIGH (ref 70–99)
Potassium: 4 mEq/L (ref 3.5–5.1)
Sodium: 136 mEq/L (ref 135–145)

## 2011-09-23 MED ORDER — CIPROFLOXACIN HCL 500 MG PO TABS: 500.0000 mg | ORAL_TABLET | Freq: Two times a day (BID) | ORAL | Status: AC

## 2011-09-23 MED ORDER — DSS 100 MG PO CAPS: 100.0000 mg | ORAL_CAPSULE | Freq: Two times a day (BID) | ORAL | Status: AC

## 2011-09-23 NOTE — Discharge Summary (Signed)
Physician Discharge Summary  Patient ID: DELDRICK LINCH MRN: [id number] DOB/AGE: [birth year] 71 y.o.  Admit date: 09/22/2011 Discharge date: 09/23/2011  Admission Diagnoses: Bladder tumor  Discharge Diagnoses: Same Active Problems:  * No active hospital problems. *    Discharged Condition: good  Hospital Course: Mr. Wynter had a TURBT on 3/12 for a large sessile tumor arising at the bladder neck, possibly secondary to his prostate cancer or the prior radiation therapy.  The tumor appears invasive and only a partial resection was possible.  He is doing well with a clear urine this morning.  His renal function remains normal with a Cr of about 1.  I will remove his foley and send him home when voiding.  Consults: None  Significant Diagnostic Studies: None  Treatments: surgery: TURBT >5cm tumor  Discharge Exam: Blood pressure 139/81, pulse 81, temperature 99 F (37.2 C), temperature source Oral, resp. rate 19, height 6' (1.829 m), weight 98.431 kg (217 lb), SpO2 97.00%. General appearance: alert and no distress Male genitalia: normal, Urine clear in foley tubing.  Disposition: He will be discharged to home when voiding.  Medication List  As of 09/23/2011  6:39 AM   TAKE these medications         aspirin 81 MG chewable tablet   Chew 81 mg by mouth daily.      ciprofloxacin 500 MG tablet   Commonly known as: CIPRO   Take 1 tablet (500 mg total) by mouth 2 (two) times daily.      DSS 100 MG Caps   Take 100 mg by mouth 2 (two) times daily.      hydrochlorothiazide 25 MG tablet   Commonly known as: HYDRODIURIL   Take 12.5 mg by mouth every morning.      levothyroxine 125 MCG tablet   Commonly known as: SYNTHROID, LEVOTHROID   Take 125 mcg by mouth daily.      lisinopril 40 MG tablet   Commonly known as: PRINIVIL,ZESTRIL   Take 40 mg by mouth every morning.      mirtazapine 15 MG tablet   Commonly known as: REMERON   Take 15 mg by mouth at bedtime.     mulitivitamin with minerals Tabs   Take 1 tablet by mouth every morning.      simvastatin 40 MG tablet   Commonly known as: ZOCOR   Take 40 mg by mouth at bedtime.           Follow-up Information    Follow up with Anner Crete, MD on 10/01/2011. (130)    Contact information:   [address] [place] [address] [phone]          Signed: Anner Crete 09/23/2011, 6:39 AM

## 2011-09-23 NOTE — Op Note (Signed)
NAMECONALL, VANGORDER NO.:  [id number]  MEDICAL RECORD NO.:  [id number]  LOCATION:  1417                         FACILITY:  [facility]  PHYSICIAN:  Excell Seltzer. Annabell Howells, M.D.    DATE OF BIRTH:  Jan 19, 1941  DATE OF PROCEDURE:  09/22/2011 DATE OF DISCHARGE:                              OPERATIVE REPORT   PROCEDURE:  Exam under anesthesia and transurethral resection of large >5cm  bladder tumor.  PREOPERATIVE DIAGNOSIS:  Bladder tumor.  POSTOPERATIVE DIAGNOSIS:  Bladder tumor.  SURGEON:  Excell Seltzer. Annabell Howells, M.D.  ANESTHESIA:  General.  SPECIMEN:  Bladder tumor chips.  DRAINS:  A 22-French Foley catheter.  BLOOD LOSS:  Minimal.  COMPLICATIONS:  None.  INDICATIONS:  Mr. Stahl is a 71 year old white male with a history of prostate cancer treated with seeds in 2002.  His PSAs have been low.  He was recently evaluated for gross hematuria and was found to have a mass extending from the bladder neck to the mid bladder circumferentially, sparing only the trigone and it was felt this was either a primary bladder neoplasm or a recurrent prostate cancer.  FINDINGS AND DESCRIPTION OF PROCEDURE:  He was given Cipro and fitted with PAS hose.  General anesthetic was induced.  He was placed in lithotomy position.  His perineum and genitalia were prepped with Betadine solution and he was draped in usual sterile fashion.  A double- glove technique was used to perform an exam under anesthesia.  Mass effect could be felt; however, it was somewhat mobile, but extensive, approximately 6 to 7 cm in diameter on bimanual palpation.  At this point, gloves were changed and a 22-French cystoscope with 12 and 70-degree lenses were used to inspect the bladder.  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  Examination of the prostate revealed blanching with some neovascularity consistent with prior  radiation.  The prostate was actually quite small with minimal actual obstruction, although there was a high-bladder neck.  Once in the bladder, there was a lesion extending from the bladder neck to approximately mid bladder from 7 o'clock around to about 5 o'clock. This was covered with dystrophic calcifications.  There was about a 2 cm diverticulum noted just medial to the right margin of the tumor.  The trigonal ridge was not readily identified and the ureteral orifices were not seen.  The remainder of the bladder wall had moderate trabeculation, but an otherwise normal mucosa.  Once thorough inspection had been performed, the urethra was calibrated to 30-French with Geraldo Pitter sounds over a wire after initial attempt with Sissy Hoff sounds was aborted because of concern for false passage.  Once the urethra had been dilated, a 28-French continuous flow resectoscope sheath was inserted without difficulty, and this was fitted with an Wandra Scot handle with 12-degree lens and bipolar loop.  Saline was used as the irrigant.  The tumor was resected initially on the right side as best as possible out to the muscular fibers to get a deep base.  It was relatively avascular tumor, but there were dense dystrophic calcifications in some areas and it was very difficult, if  not impossible to resect because of the overlying calcification, however, was able to debulk the tumor on the lateral aspects bilaterally and remove some anteriorly, but this was where the bulk of the calcification was and resection was very problematic.  A small amount of the prostate median bar was resected as well to eliminate any potential obstruction and also to complete the resection.  Once resection was completed, the chips were removed and final hemostasis was achieved.  I was not able to identify the ureteral orifices, but the patient had not been found to have hydro in his recent CTA.  At this point, a 20-French  Foley catheter was placed with the aid of a catheter guide.  The balloon was filled with 10 cc of sterile fluid. The catheter was irrigated with clear return and placed to straight drainage.  He was taken down from lithotomy position and his anesthetic was reversed.  He was moved to recovery room in stable condition.  The specimen was sent to Pathology, and I will request PSA stain because of a history of prostate cancer.  There were no complications.     Excell Seltzer. Annabell Howells, M.D.     JJW/MEDQ  D:  09/22/2011  T:  09/23/2011  Job:  [id number]  cc:   Barry Dienes. Eloise Harman, M.D. Fax: [phone]

## 2011-10-06 ENCOUNTER — Other Ambulatory Visit: Payer: Self-pay | Admitting: Urology

## 2011-10-07 ENCOUNTER — Encounter (HOSPITAL_COMMUNITY): Payer: Self-pay | Admitting: Urology

## 2011-11-02 ENCOUNTER — Encounter (HOSPITAL_COMMUNITY): Payer: Self-pay | Admitting: Pharmacy Technician

## 2011-11-04 ENCOUNTER — Encounter (HOSPITAL_COMMUNITY): Payer: Self-pay

## 2011-11-04 ENCOUNTER — Encounter (HOSPITAL_COMMUNITY)
Admission: RE | Admit: 2011-11-04 | Discharge: 2011-11-04 | Disposition: A | Discharge status: Home / Self Care | Payer: Medicare Other | Source: Ambulatory Visit | Attending: Urology | Admitting: Urology

## 2011-11-04 HISTORY — DX: Unspecified speech disturbances: R47.9

## 2011-11-04 HISTORY — DX: Sleep apnea, unspecified: G47.30

## 2011-11-04 LAB — BASIC METABOLIC PANEL
Calcium: 9.7 mg/dL (ref 8.4–10.5)
GFR calc non Af Amer: 83 mL/min — ABNORMAL LOW (ref >90)
Glucose, Bld: 101 mg/dL — ABNORMAL HIGH (ref 70–99)
Sodium: 138 mEq/L (ref 135–145)

## 2011-11-04 LAB — CBC
Hemoglobin: 13.9 g/dL (ref 13.0–17.0)
MCH: 27.9 pg (ref 26.0–34.0)
MCHC: 33.4 g/dL (ref 30.0–36.0)
RDW: 14.1 % (ref 11.5–15.5)

## 2011-11-04 LAB — PROTIME-INR: INR: 0.98 (ref 0.00–1.49)

## 2011-11-04 LAB — SURGICAL PCR SCREEN: MRSA, PCR: NEGATIVE

## 2011-11-04 NOTE — Pre-Procedure Instructions (Signed)
PT HAS EKG AND CXR REPORTS FROM [facility] -DONE 09/17/2011--RESULTS IN EPIC AND COPIES ARE ON PT'S CHART.  CBC, BMET, PT WERE DONE TODAY AT [facility] PREOP AND T/S WILL BE DRAWN THE DAY OF PT'S SURGERY.

## 2011-11-04 NOTE — Patient Instructions (Signed)
YOUR SURGERY IS SCHEDULED ON:  WED 5/1  AT 8:30 AM  REPORT TO [place] [facility] AT: 6:30 AM      PHONE # FOR SHORT STAY IS [phone]  FOLLOW YOUR BOWEL PREP INSTRUCTIONS FROM DR. DAHLSTEDT'S OFFICE THE DAY BEFORE YOUR SURGERY.  DO NOT EAT OR DRINK ANYTHING AFTER MIDNIGHT THE NIGHT BEFORE YOUR SURGERY.  YOU MAY BRUSH YOUR TEETH, RINSE OUT YOUR MOUTH--BUT NO WATER, NO FOOD, NO CHEWING GUM, NO MINTS, NO CANDIES, NO CHEWING TOBACCO.  PLEASE TAKE THE FOLLOWING MEDICATIONS THE AM OF YOUR SURGERY WITH A FEW SIPS OF WATER: THYROID PILL    IF YOU USE INHALERS--USE YOUR INHALERS THE AM OF YOUR SURGERY AND BRING INHALERS TO THE HOSPITAL -TAKE TO SURGERY.    IF YOU ARE DIABETIC:  DO NOT TAKE ANY DIABETIC MEDICATIONS THE AM OF YOUR SURGERY.  IF YOU TAKE INSULIN IN THE EVENINGS--PLEASE ONLY TAKE 1/2 NORMAL EVENING DOSE THE NIGHT BEFORE YOUR SURGERY.  NO INSULIN THE AM OF YOUR SURGERY.  IF YOU HAVE SLEEP APNEA AND USE CPAP OR BIPAP--PLEASE BRING THE MASK --NOT THE MACHINE-NOT THE TUBING   -JUST THE MASK. DO NOT BRING VALUABLES, MONEY, CREDIT CARDS.  CONTACT LENS, DENTURES / PARTIALS, GLASSES SHOULD NOT BE WORN TO SURGERY AND IN MOST CASES-HEARING AIDS WILL NEED TO BE REMOVED.  BRING YOUR GLASSES CASE, ANY EQUIPMENT NEEDED FOR YOUR CONTACT LENS. FOR PATIENTS ADMITTED TO THE HOSPITAL--CHECK OUT TIME THE DAY OF DISCHARGE IS 11:00 AM.  ALL INPATIENT ROOMS ARE PRIVATE - WITH BATHROOM, TELEPHONE, TELEVISION AND WIFI INTERNET. IF YOU ARE BEING DISCHARGED THE SAME DAY OF YOUR SURGERY--YOU CAN NOT DRIVE YOURSELF HOME--AND SHOULD NOT GO HOME ALONE BY TAXI OR BUS.  NO DRIVING OR OPERATING MACHINERY FOR 24 HOURS FOLLOWING ANESTHESIA / PAIN MEDICATIONS.                            SPECIAL INSTRUCTIONS:  CHLORHEXIDINE SOAP SHOWER (other brand names are Betasept and Hibiclens ) PLEASE SHOWER WITH CHLORHEXIDINE THE NIGHT BEFORE YOUR SURGERY AND THE AM OF YOUR SURGERY. DO NOT USE CHLORHEXIDINE ON YOUR FACE OR  PRIVATE AREAS--YOU MAY USE YOUR NORMAL SOAP THOSE AREAS AND YOUR NORMAL SHAMPOO.  WOMEN SHOULD AVOID SHAVING UNDER ARMS AND SHAVING LEGS 48 HOURS BEFORE USING CHLORHEXIDINE TO AVOID SKIN IRRITATION.  DO NOT USE IF ALLERGIC TO CHLORHEXIDINE.  PLEASE READ OVER ANY  FACT SHEETS THAT YOU WERE GIVEN: MRSA INFORMATION, BLOOD TRANSFUSION INFORMATION.

## 2011-11-10 NOTE — H&P (Signed)
Urology History and Physical Exam  CC: bladder cancer  HPI: 71 year old male who was admitted for radical cystoprostatectomy, bilateral pelvic lymph node dissection and creation of ileal conduit urinary diversion. His history is as follows:  He has a history of prostate cancer s/p seed implant in 2002.  His PSA has beenappropriatelylow.  He had a bladder stone removed in 8/11.  He returned in January2013 with gross hematuria.  He was seen by Jetta Lout, NPand had a negative culture.  He was started on Tamsulosin for his voiding symptoms.   A CT on February 5, 2013showed possible bladder stones and a diverticulum, but on further  review there was some bladder wall thickening and Dr. Annabell Howells was concerned about neoplasm.  He underwent cystoscopy and resection of a bladder tumor on 10/03/2011. This revealed sarcomatoid urothelial carcinoma. There is no evidence of pelvic or distal metastatic disease. Dr. Ma Rings referred him to me for discussion on cystectomy ,with consultation performed 10/26/2011. At that time, we discussed cystectomy, risks and complications. He desires to proceed PMH: Past Medical History  Diagnosis Date  . Hypertension   . Elevated cholesterol   . GERD (gastroesophageal reflux disease)   . Bladder tumor   . Difficulty urinating   . Hypothyroidism     UNSURE IF TOO LOW OR TOO HIGH  . Difficulty sleeping   . Cancer     HX PROSTATE CANCER AND BLADDER CANCER  . Speech impediment     SINCE BIRTH  . Sleep apnea     STOP BANG SCORE 5    PSH: Past Surgical History  Procedure Date  . Cataracts 2012    RT CATARACT REMOVED  . Radioactive seed implant 1990'S    PROSTATE CANCER  . Hernia repair 1980'S  . Transurethral resection of bladder tumor 09/22/2011    Procedure: TRANSURETHRAL RESECTION OF BLADDER TUMOR (TURBT);  Surgeon: Anner Crete, MD;  Location: [facility];  Service: Urology;  Laterality: N/A;  Examination Under Anesthesia/TUR-BT with Gyrus    Allergies: No Known  Allergies  Medications: No prescriptions prior to admission     Social History: History   Social History  . Marital Status: Single    Spouse Name: N/A    Number of Children: N/A  . Years of Education: N/A   Occupational History  . Not on file.   Social History Main Topics  . Smoking status: Never Smoker   . Smokeless tobacco: Never Used  . Alcohol Use: No  . Drug Use: No  . Sexually Active:    Other Topics Concern  . Not on file   Social History Narrative  . No narrative on file    Family History: No family history on file.  Review of Systems Genitourinary, constitutional, skin, eye, otolaryngeal, hematologic/lymphatic, cardiovascular, pulmonary, endocrine, musculoskeletal, gastrointestinal, neurological and psychiatric system(s) were reviewed and pertinent findings if present are noted.  Genitourinary: weak urinary stream and hematuria.     Physical Exam: @VITALS2 @ General: No acute distress.  Awake. Head:  Normocephalic.  Atraumatic. ENT:  EOMI.  Mucous membranes moist Neck:  Supple.  No lymphadenopathy. CV:  S1 present. S2 present. Regular rate. Pulmonary: Equal effort bilaterally.  Clear to auscultation bilaterally. Abdomen: Soft. Nontender to palpation. Skin:  Normal turgor.  No visible rash. Extremity: No gross deformity of bilateral upper extremities.  No gross deformity of    bilateral lower extremities. Neurologic: Alert. Appropriate mood.  Penis:  Uncircumcised.  No lesions. Urethra:   Orthotopic meatus. Scrotum:  No lesions.  No ecchymosis.  No erythema. Testicles: Descended bilaterally.  No masses bilaterally. Epididymis: Palpable bilaterally.  Non Tender to palpation.  Studies:  No results found for this basename: HGB:2,WBC:2,PLT:2 in the last 72 hours  No results found for this basename: NA:2,K:2,CL:2,CO2:2,BUN:2,CREATININE:2,CALCIUM:2,MAGNESIUM:2,GFRNONAA:2,GFRAA:2 in the last 72 hours   No results found for this basename:  PT:2,INR:2,APTT:2 in the last 72 hours   No components found with this basename: ABG:2    Assessment:  Urothelial/sarcomatoid tumor of the bladder, status post TURBT3/06/2012. He has no evidence of metastatic disease.  Plan: Radical cystoprostatectomy, bilateral pelvic lymph node dissection, ileal conduit formation.

## 2011-11-10 NOTE — Anesthesia Preprocedure Evaluation (Addendum)
Anesthesia Evaluation  Patient identified by MRN, date of birth, ID band Patient awake    Reviewed: Allergy & Precautions, H&P , NPO status , Patient's Chart, lab work & pertinent test results  Airway Mallampati: II TM Distance: >3 FB Neck ROM: full    Dental  (+) Missing and Dental Advisory Given A few missing teeth in back.  None in front.:   Pulmonary neg pulmonary ROS, sleep apnea ,  Stop bang 5 breath sounds clear to auscultation  Pulmonary exam normal       Cardiovascular Exercise Tolerance: Good hypertension, Pt. on medications negative cardio ROS  Rhythm:regular Rate:Normal     Neuro/Psych negative neurological ROS  negative psych ROS   GI/Hepatic negative GI ROS, Neg liver ROS, GERD-  Medicated and Controlled,  Endo/Other  negative endocrine ROSHypothyroidism   Renal/GU negative Renal ROS  negative genitourinary   Musculoskeletal   Abdominal   Peds  Hematology negative hematology ROS (+)   Anesthesia Other Findings   Reproductive/Obstetrics negative OB ROS                          Anesthesia Physical Anesthesia Plan  ASA: III  Anesthesia Plan: General   Post-op Pain Management:    Induction: Intravenous  Airway Management Planned: Oral ETT  Additional Equipment:   Intra-op Plan:   Post-operative Plan: Extubation in OR  Informed Consent: I have reviewed the patients History and Physical, chart, labs and discussed the procedure including the risks, benefits and alternatives for the proposed anesthesia with the patient or authorized representative who has indicated his/her understanding and acceptance.   Dental Advisory Given  Plan Discussed with: CRNA and Surgeon  Anesthesia Plan Comments:         Anesthesia Quick Evaluation

## 2011-11-11 ENCOUNTER — Ambulatory Visit (HOSPITAL_COMMUNITY): Payer: Medicare Other | Admitting: Anesthesiology

## 2011-11-11 ENCOUNTER — Encounter (HOSPITAL_COMMUNITY): Payer: Self-pay | Admitting: Anesthesiology

## 2011-11-11 ENCOUNTER — Encounter (HOSPITAL_COMMUNITY): Payer: Self-pay

## 2011-11-11 ENCOUNTER — Inpatient Hospital Stay (HOSPITAL_COMMUNITY)
Admission: RE | Admit: 2011-11-11 | Discharge: 2011-11-18 | DRG: 655 | Disposition: A | Discharge status: Home Health | Payer: Medicare Other | Source: Ambulatory Visit | Attending: Urology | Admitting: Urology

## 2011-11-11 ENCOUNTER — Encounter (HOSPITAL_COMMUNITY)
Admission: RE | Disposition: A | Discharge status: Home Health | Payer: Self-pay | Source: Ambulatory Visit | Attending: Urology

## 2011-11-11 DIAGNOSIS — E039 Hypothyroidism, unspecified: Secondary | ICD-10-CM | POA: Diagnosis present

## 2011-11-11 DIAGNOSIS — K219 Gastro-esophageal reflux disease without esophagitis: Secondary | ICD-10-CM | POA: Diagnosis present

## 2011-11-11 DIAGNOSIS — E7800 Pure hypercholesterolemia, unspecified: Secondary | ICD-10-CM | POA: Diagnosis present

## 2011-11-11 DIAGNOSIS — C679 Malignant neoplasm of bladder, unspecified: Principal | ICD-10-CM | POA: Diagnosis present

## 2011-11-11 DIAGNOSIS — I10 Essential (primary) hypertension: Secondary | ICD-10-CM | POA: Diagnosis present

## 2011-11-11 DIAGNOSIS — N323 Diverticulum of bladder: Secondary | ICD-10-CM | POA: Diagnosis present

## 2011-11-11 DIAGNOSIS — R4789 Other speech disturbances: Secondary | ICD-10-CM | POA: Diagnosis present

## 2011-11-11 DIAGNOSIS — G4730 Sleep apnea, unspecified: Secondary | ICD-10-CM | POA: Diagnosis present

## 2011-11-11 DIAGNOSIS — Z87442 Personal history of urinary calculi: Secondary | ICD-10-CM

## 2011-11-11 DIAGNOSIS — Z8546 Personal history of malignant neoplasm of prostate: Secondary | ICD-10-CM

## 2011-11-11 HISTORY — PX: LYMPH NODE DISSECTION: SHX5087

## 2011-11-11 HISTORY — PX: CYSTECTOMY: SHX5119

## 2011-11-11 LAB — POCT I-STAT 4, (NA,K, GLUC, HGB,HCT)
Glucose, Bld: 124 mg/dL — ABNORMAL HIGH (ref 70–99)
HCT: 25 % — ABNORMAL LOW (ref 39.0–52.0)
Hemoglobin: 8.5 g/dL — ABNORMAL LOW (ref 13.0–17.0)
Sodium: 135 mEq/L (ref 135–145)

## 2011-11-11 LAB — ABO/RH: ABO/RH(D): O POS

## 2011-11-11 LAB — HEMOGLOBIN AND HEMATOCRIT, BLOOD
HCT: 30.9 % — ABNORMAL LOW (ref 39.0–52.0)
Hemoglobin: 10.5 g/dL — ABNORMAL LOW (ref 13.0–17.0)

## 2011-11-11 SURGERY — CYSTECTOMY, COMPLETE
Anesthesia: General | Wound class: Clean Contaminated

## 2011-11-11 MED ORDER — HETASTARCH-ELECTROLYTES 6 % IV SOLN
INTRAVENOUS | Status: DC | PRN
  Administered 2011-11-11: 10:00:00 via INTRAVENOUS

## 2011-11-11 MED ORDER — ACETAMINOPHEN 10 MG/ML IV SOLN
INTRAVENOUS | Status: DC | PRN
  Administered 2011-11-11: 1000 mg via INTRAVENOUS

## 2011-11-11 MED ORDER — HYDROMORPHONE HCL PF 1 MG/ML IJ SOLN
INTRAMUSCULAR | Status: AC
  Filled 2011-11-11: qty 1

## 2011-11-11 MED ORDER — 0.9 % SODIUM CHLORIDE (POUR BTL) OPTIME
TOPICAL | Status: DC | PRN
  Administered 2011-11-11: 2000 mL

## 2011-11-11 MED ORDER — ONDANSETRON HCL 4 MG/2ML IJ SOLN: 4.0000 mg | Freq: Four times a day (QID) | INTRAMUSCULAR | Status: DC | PRN

## 2011-11-11 MED ORDER — LEVOTHYROXINE SODIUM 125 MCG PO TABS
125.0000 ug | ORAL_TABLET | Freq: Every day | ORAL | Status: DC
  Administered 2011-11-12 – 2011-11-18 (×7): 125 ug via ORAL
  Filled 2011-11-11 (×9): qty 1

## 2011-11-11 MED ORDER — CEFAZOLIN SODIUM-DEXTROSE 2-3 GM-% IV SOLR
2.0000 g | INTRAVENOUS | Status: AC
  Administered 2011-11-11: 2 g via INTRAVENOUS

## 2011-11-11 MED ORDER — HYDROMORPHONE 0.3 MG/ML IV SOLN
INTRAVENOUS | Status: DC
  Administered 2011-11-11: 2.4 mg via INTRAVENOUS
  Administered 2011-11-11 (×2): 0.3 mg via INTRAVENOUS
  Administered 2011-11-12: 1.5 mg via INTRAVENOUS
  Administered 2011-11-12: 1.2 mg via INTRAVENOUS
  Administered 2011-11-12: 0.9 mg via INTRAVENOUS
  Administered 2011-11-12: 1.06 mg via INTRAVENOUS
  Administered 2011-11-12: 0.9 mg via INTRAVENOUS
  Administered 2011-11-12 – 2011-11-13 (×2): 0.6 mg via INTRAVENOUS
  Administered 2011-11-13 (×2): 0.9 mg via INTRAVENOUS
  Filled 2011-11-11: qty 25

## 2011-11-11 MED ORDER — ONDANSETRON HCL 4 MG/2ML IJ SOLN
INTRAMUSCULAR | Status: DC | PRN
  Administered 2011-11-11: 4 mg via INTRAVENOUS

## 2011-11-11 MED ORDER — ACETAMINOPHEN 10 MG/ML IV SOLN
INTRAVENOUS | Status: AC
  Filled 2011-11-11: qty 100

## 2011-11-11 MED ORDER — HYDROMORPHONE 0.3 MG/ML IV SOLN
INTRAVENOUS | Status: AC
  Filled 2011-11-11: qty 25

## 2011-11-11 MED ORDER — LISINOPRIL 40 MG PO TABS
40.0000 mg | ORAL_TABLET | Freq: Every day | ORAL | Status: DC
  Administered 2011-11-12 – 2011-11-18 (×7): 40 mg via ORAL
  Filled 2011-11-11 (×7): qty 1

## 2011-11-11 MED ORDER — LACTATED RINGERS IV SOLN: INTRAVENOUS | Status: DC

## 2011-11-11 MED ORDER — ALVIMOPAN 12 MG PO CAPS
12.0000 mg | ORAL_CAPSULE | Freq: Once | ORAL | Status: AC
  Administered 2011-11-11: 12 mg via ORAL

## 2011-11-11 MED ORDER — LACTATED RINGERS IV SOLN
INTRAVENOUS | Status: DC | PRN
  Administered 2011-11-11 (×3): via INTRAVENOUS

## 2011-11-11 MED ORDER — CISATRACURIUM BESYLATE 2 MG/ML IV SOLN
INTRAVENOUS | Status: DC | PRN
  Administered 2011-11-11: 2 mg via INTRAVENOUS
  Administered 2011-11-11: 6 mg via INTRAVENOUS
  Administered 2011-11-11: 4 mg via INTRAVENOUS
  Administered 2011-11-11: 2 mg via INTRAVENOUS
  Administered 2011-11-11: 8 mg via INTRAVENOUS
  Administered 2011-11-11: 12 mg via INTRAVENOUS
  Administered 2011-11-11 (×2): 2 mg via INTRAVENOUS

## 2011-11-11 MED ORDER — SUFENTANIL CITRATE 50 MCG/ML IV SOLN
INTRAVENOUS | Status: DC | PRN
  Administered 2011-11-11 (×3): 10 ug via INTRAVENOUS
  Administered 2011-11-11: 5 ug via INTRAVENOUS
  Administered 2011-11-11: 10 ug via INTRAVENOUS
  Administered 2011-11-11: 15 ug via INTRAVENOUS
  Administered 2011-11-11 (×4): 10 ug via INTRAVENOUS

## 2011-11-11 MED ORDER — ALVIMOPAN 12 MG PO CAPS
12.0000 mg | ORAL_CAPSULE | Freq: Two times a day (BID) | ORAL | Status: DC
  Administered 2011-11-12 – 2011-11-14 (×6): 12 mg via ORAL
  Filled 2011-11-11 (×8): qty 1

## 2011-11-11 MED ORDER — LACTATED RINGERS IV SOLN
INTRAVENOUS | Status: DC | PRN
  Administered 2011-11-11 (×4): via INTRAVENOUS

## 2011-11-11 MED ORDER — HYDROMORPHONE HCL PF 1 MG/ML IJ SOLN
0.2500 mg | INTRAMUSCULAR | Status: DC | PRN
  Administered 2011-11-11 (×2): 0.5 mg via INTRAVENOUS

## 2011-11-11 MED ORDER — ALVIMOPAN 12 MG PO CAPS: 12.0000 mg | ORAL_CAPSULE | Freq: Once | ORAL | Status: DC

## 2011-11-11 MED ORDER — ALVIMOPAN 12 MG PO CAPS
ORAL_CAPSULE | ORAL | Status: AC
  Administered 2011-11-11: 12 mg via ORAL
  Filled 2011-11-11: qty 1

## 2011-11-11 MED ORDER — LIDOCAINE HCL (CARDIAC) 20 MG/ML IV SOLN
INTRAVENOUS | Status: DC | PRN
  Administered 2011-11-11: 75 mg via INTRAVENOUS

## 2011-11-11 MED ORDER — DIPHENHYDRAMINE HCL 12.5 MG/5ML PO ELIX
12.5000 mg | ORAL_SOLUTION | Freq: Four times a day (QID) | ORAL | Status: DC | PRN
  Filled 2011-11-11: qty 5

## 2011-11-11 MED ORDER — CEFAZOLIN SODIUM-DEXTROSE 2-3 GM-% IV SOLR
INTRAVENOUS | Status: AC
  Filled 2011-11-11: qty 50

## 2011-11-11 MED ORDER — NEOSTIGMINE METHYLSULFATE 1 MG/ML IJ SOLN
INTRAMUSCULAR | Status: DC | PRN
  Administered 2011-11-11: 4 mg via INTRAVENOUS

## 2011-11-11 MED ORDER — DEXTROSE-NACL 5-0.45 % IV SOLN
INTRAVENOUS | Status: DC
  Administered 2011-11-11 – 2011-11-15 (×6): via INTRAVENOUS

## 2011-11-11 MED ORDER — MIRTAZAPINE 15 MG PO TABS
15.0000 mg | ORAL_TABLET | Freq: Every day | ORAL | Status: DC
  Administered 2011-11-12 – 2011-11-17 (×6): 15 mg via ORAL
  Filled 2011-11-11 (×8): qty 1

## 2011-11-11 MED ORDER — DIPHENHYDRAMINE HCL 50 MG/ML IJ SOLN: 12.5000 mg | Freq: Four times a day (QID) | INTRAMUSCULAR | Status: DC | PRN

## 2011-11-11 MED ORDER — CEFAZOLIN SODIUM 1-5 GM-% IV SOLN
1.0000 g | Freq: Four times a day (QID) | INTRAVENOUS | Status: AC
  Administered 2011-11-11: 1 g via INTRAVENOUS
  Filled 2011-11-11: qty 50

## 2011-11-11 MED ORDER — SODIUM CHLORIDE 0.9 % IJ SOLN: 9.0000 mL | INTRAMUSCULAR | Status: DC | PRN

## 2011-11-11 MED ORDER — ACETAMINOPHEN 10 MG/ML IV SOLN
1000.0000 mg | Freq: Four times a day (QID) | INTRAVENOUS | Status: AC
  Administered 2011-11-11 – 2011-11-12 (×4): 1000 mg via INTRAVENOUS
  Filled 2011-11-11 (×5): qty 100

## 2011-11-11 MED ORDER — GLYCOPYRROLATE 0.2 MG/ML IJ SOLN
INTRAMUSCULAR | Status: DC | PRN
  Administered 2011-11-11: 0.6 mg via INTRAVENOUS

## 2011-11-11 MED ORDER — PROPOFOL 10 MG/ML IV BOLUS
INTRAVENOUS | Status: DC | PRN
  Administered 2011-11-11: 150 mg via INTRAVENOUS

## 2011-11-11 MED ORDER — ADULT MULTIVITAMIN W/MINERALS CH
1.0000 | ORAL_TABLET | Freq: Every day | ORAL | Status: DC
  Administered 2011-11-12 – 2011-11-18 (×7): 1 via ORAL
  Filled 2011-11-11 (×7): qty 1

## 2011-11-11 MED ORDER — MIDAZOLAM HCL 5 MG/5ML IJ SOLN
INTRAMUSCULAR | Status: DC | PRN
  Administered 2011-11-11: 2 mg via INTRAVENOUS

## 2011-11-11 MED ORDER — NALOXONE HCL 0.4 MG/ML IJ SOLN: 0.4000 mg | INTRAMUSCULAR | Status: DC | PRN

## 2011-11-11 MED ORDER — SIMVASTATIN 40 MG PO TABS
40.0000 mg | ORAL_TABLET | Freq: Every day | ORAL | Status: DC
  Administered 2011-11-11 – 2011-11-17 (×7): 40 mg via ORAL
  Filled 2011-11-11 (×8): qty 1

## 2011-11-11 MED FILL — Cisatracurium Besylate (PF) IV Soln 10 MG/5ML (2 MG/ML): INTRAVENOUS | Qty: 5 | Status: AC

## 2011-11-11 SURGICAL SUPPLY — 81 items
ADAPTER GOLDBERG URETERAL (ADAPTER) IMPLANT
ADPR CATH 15X14FR FL DRN BG (ADAPTER)
BLADE EXTENDED COATED 6.5IN (ELECTRODE) ×2 IMPLANT
BLADE HEX COATED 2.75 (ELECTRODE) ×2 IMPLANT
CANISTER SUCTION 2500CC (MISCELLANEOUS) ×2 IMPLANT
CATH FOLEY 2WAY SLVR 30CC 22FR (CATHETERS) ×2 IMPLANT
CATH ROBINSON RED A/P 20FR (CATHETERS) IMPLANT
CHLORAPREP W/TINT 26ML (MISCELLANEOUS) ×2 IMPLANT
CLIP LIGATING HEM O LOK PURPLE (MISCELLANEOUS) ×6 IMPLANT
CLIP LIGATING HEMO O LOK GREEN (MISCELLANEOUS) ×6 IMPLANT
CLIP TI LARGE 6 (CLIP) IMPLANT
CLOTH BEACON ORANGE TIMEOUT ST (SAFETY) ×2 IMPLANT
COVER MAYO STAND STRL (DRAPES) IMPLANT
COVER SURGICAL LIGHT HANDLE (MISCELLANEOUS) ×2 IMPLANT
DISSECTOR ROUND CHERRY 3/8 STR (MISCELLANEOUS) ×2 IMPLANT
DRAIN CHANNEL 10F 3/8 F FF (DRAIN) ×2 IMPLANT
DRAPE LAPAROSCOPIC ABDOMINAL (DRAPES) ×1 IMPLANT
DRAPE LG THREE QUARTER DISP (DRAPES) IMPLANT
DRAPE TABLE BACK 44X90 PK DISP (DRAPES) ×1 IMPLANT
DRAPE WARM FLUID 44X44 (DRAPE) ×2 IMPLANT
ELECT REM PT RETURN 9FT ADLT (ELECTROSURGICAL) ×2
ELECTRODE REM PT RTRN 9FT ADLT (ELECTROSURGICAL) ×1 IMPLANT
EVACUATOR SILICONE 100CC (DRAIN) ×2 IMPLANT
FLOSEAL (HEMOSTASIS) ×1 IMPLANT
GAUZE SPONGE 4X4 16PLY XRAY LF (GAUZE/BANDAGES/DRESSINGS) ×3 IMPLANT
GLOVE BIOGEL M 8.0 STRL (GLOVE) ×2 IMPLANT
GOWN STRL REIN XL XLG (GOWN DISPOSABLE) ×2 IMPLANT
HEMOSTAT SURGICEL 4X8 (HEMOSTASIS) ×1 IMPLANT
KIT BASIN OR (CUSTOM PROCEDURE TRAY) ×2 IMPLANT
LIGASURE IMPACT 36 18CM CVD LR (INSTRUMENTS) ×2 IMPLANT
LOOP VESSEL MAXI BLUE (MISCELLANEOUS) ×2 IMPLANT
LUBRICANT JELLY ST 5GR 8946 (MISCELLANEOUS) ×5 IMPLANT
NDL BLUNT 17GA (NEEDLE) IMPLANT
NEEDLE BLUNT 17GA (NEEDLE) IMPLANT
NS IRRIG 1000ML POUR BTL (IV SOLUTION) ×4 IMPLANT
PACK GENERAL/GYN (CUSTOM PROCEDURE TRAY) ×2 IMPLANT
PAK SCROTO (SET/KITS/TRAYS/PACK) IMPLANT
PENCIL BUTTON HOLSTER BLD 10FT (ELECTRODE) IMPLANT
PLUG CATH AND CAP STER (CATHETERS) ×2 IMPLANT
RELOAD LINEAR CUT PROX 55 BLUE (ENDOMECHANICALS) ×4 IMPLANT
RELOAD STAPLE 55 3.8 BLU REG (ENDOMECHANICALS) ×2 IMPLANT
RETRACTOR WILSON SYSTEM (INSTRUMENTS) IMPLANT
SHEET LAVH (DRAPES) IMPLANT
SPONGE GAUZE 4X4 12PLY (GAUZE/BANDAGES/DRESSINGS) ×2 IMPLANT
SPONGE LAP 18X18 X RAY DECT (DISPOSABLE) ×7 IMPLANT
STAPLER GUN LINEAR PROX 60 (STAPLE) ×2 IMPLANT
STAPLER PROXIMATE 55 BLUE (STAPLE) ×2 IMPLANT
STAPLER VISISTAT 35W (STAPLE) ×2 IMPLANT
STENT CONTOUR 7FRX24X.038 (STENTS) ×2 IMPLANT
STENT SINGLE 7F (STENTS) ×2 IMPLANT
SUT CHROMIC 3 0 SH 27 (SUTURE) ×3 IMPLANT
SUT ETHILON 3 0 PS 1 (SUTURE) ×1 IMPLANT
SUT PDS AB 1 CTX 36 (SUTURE) ×2 IMPLANT
SUT PDS AB 1 TP1 54 (SUTURE) ×1 IMPLANT
SUT PDS AB 1 TP1 96 (SUTURE) ×2 IMPLANT
SUT PDS AB 3-0 SH 27 (SUTURE) IMPLANT
SUT PDS AB 4-0 RB1 27 (SUTURE) ×9 IMPLANT
SUT PDS AB 4-0 SH 27 (SUTURE) ×5 IMPLANT
SUT SILK 0 (SUTURE) ×2
SUT SILK 0 30XBRD TIE 6 (SUTURE) ×2 IMPLANT
SUT SILK 2 0 (SUTURE)
SUT SILK 2-0 30XBRD TIE 12 (SUTURE) ×1 IMPLANT
SUT SILK 3 0 (SUTURE)
SUT SILK 3 0 12 30 (SUTURE) ×1 IMPLANT
SUT SILK 3 0 SH CR/8 (SUTURE) ×4 IMPLANT
SUT SILK 3-0 18XBRD TIE 12 (SUTURE) IMPLANT
SUT VIC AB 0 CT1 27 (SUTURE)
SUT VIC AB 0 CT1 27XBRD ANTBC (SUTURE) IMPLANT
SUT VIC AB 1 BRD 54 (SUTURE) ×1 IMPLANT
SUT VIC AB 3-0 SH 8-18 (SUTURE) ×3 IMPLANT
SUT VIC AB 4-0 SH 18 (SUTURE) ×2 IMPLANT
SUT VICRYL 0 UR6 27IN ABS (SUTURE) ×1 IMPLANT
SYR 30ML LL (SYRINGE) ×2 IMPLANT
SYR BULB IRRIGATION 50ML (SYRINGE) ×1 IMPLANT
SYRINGE 10CC LL (SYRINGE) ×2 IMPLANT
SYSTEM UROSTOMY GENTLE TOUCH (WOUND CARE) ×2 IMPLANT
TAPE UMBILICAL COTTON 1/8X30 (MISCELLANEOUS) ×1 IMPLANT
TOWEL OR 17X26 10 PK STRL BLUE (TOWEL DISPOSABLE) ×4 IMPLANT
URINEMETER 200ML W/220 (MISCELLANEOUS) ×1 IMPLANT
WATER STERILE IRR 1500ML POUR (IV SOLUTION) ×1 IMPLANT
YANKAUER SUCT BULB TIP 10FT TU (MISCELLANEOUS) ×1 IMPLANT

## 2011-11-11 NOTE — Progress Notes (Signed)
Patient ID: Joe Park, male   DOB: [birth year], 71 y.o.   MRN: [id number] Post-op note  Subjective: The patient is doing well.  No complaints. Pain controlled.  Objective: Vital signs in last 24 hours: Temp:  [97.6 F (36.4 C)-98.5 F (36.9 C)] 97.8 F (36.6 C) (05/01 1600) Pulse Rate:  [75-88] 76  (05/01 1800) Resp:  [14-18] 14  (05/01 1800) BP: (107-132)/(64-74) 132/74 mmHg (05/01 1800) SpO2:  [97 %-100 %] 100 % (05/01 1800) Weight:  [101.1 kg (222 lb 14.2 oz)] 101.1 kg (222 lb 14.2 oz) (05/01 1413)  Ostomy pink Intake/Output from previous day:   Intake/Output this shift: Total I/O In: 7887.5 [I.V.:7337.5; IV Piggyback:550] Out: 2000 [Urine:650; Drains:50; Blood:1300]  Physical Exam:  General: Alert and oriented. Abdomen: Soft, Nondistended. Incisions: Clean and dry.  Lab Results:  Basename 11/11/11 1053  HGB 8.5*  HCT 25.0*    Assessment/Plan: POD#0   1) Continue to monitor   Joe Millard. Missael Ferrari, MD   LOS: 0 days   Chelsea Aus 11/11/2011, 6:33 PM

## 2011-11-11 NOTE — Transfer of Care (Signed)
Immediate Anesthesia Transfer of Care Note  Patient: Joe Park  Procedure(s) Performed: Procedure(s) (LRB): CYSTECTOMY COMPLETE (N/A) LYMPH NODE DISSECTION (N/A)  Patient Location: PACU  Anesthesia Type: General  Level of Consciousness: awake, alert  and patient cooperative  Airway & Oxygen Therapy: Patient Spontanous Breathing and Patient connected to face mask oxygen  Post-op Assessment: Report given to PACU RN and Post -op Vital signs reviewed and stable  Post vital signs: Reviewed and stable  Complications: No apparent anesthesia complications

## 2011-11-11 NOTE — Consult Note (Signed)
Ostomy consult:  Stoma Site marking:  Abdomen viewed in supine, sitting and standing position.  Mark placed within rectus muscles and away from belt line.Site marked 5cm  Right and 6 cm above umbilicus region in right upper quadrant.  Marking visible by patient in sitting, standing and supine position.  Folds and creases avoided. Briefly discussed procedure and supplies for urostomy surgery.  We follow up and continue to educate post op.   [employer] agent, BSN, WOC nurse/ Cammie Mcgee, MSN, [employer]

## 2011-11-11 NOTE — Anesthesia Postprocedure Evaluation (Signed)
  Anesthesia Post-op Note  Patient: Joe Park  Procedure(s) Performed: Procedure(s) (LRB): CYSTECTOMY COMPLETE (N/A) LYMPH NODE DISSECTION (N/A)  Patient Location: PACU  Anesthesia Type: General  Level of Consciousness: awake and alert   Airway and Oxygen Therapy: Patient Spontanous Breathing  Post-op Pain: mild  Post-op Assessment: Post-op Vital signs reviewed, Patient's Cardiovascular Status Stable, Respiratory Function Stable, Patent Airway and No signs of Nausea or vomiting  Post-op Vital Signs: stable  Complications: No apparent anesthesia complications

## 2011-11-11 NOTE — Op Note (Signed)
Preoperative diagnosis: Sarcomatoid carcinoma of the bladder Postoperative diagnosis: Same  Procedure: Radical cystoprostatectomy, bilateral pelvic lymph node dissection, creation of ileal conduit   Surgeon: Bertram Millard. Aldine Chakraborty, M.D.  First assistant: Bjorn Pippin, M.D. Anesthesia: Gen. Endotracheal Specimen: Bladder, prostate/seminal vesicles, bilateral pelvic lymph nodes, left posterior margin Estimated blood loss: 1300 cc Replacement: 6000 cc crystalloid, 500 cc Hextend Indications:71 year old male who was admitted for radical cystoprostatectomy, bilateral pelvic lymph node dissection and creation of ileal conduit urinary diversion. His history is as follows:  He has a history of prostate cancer s/p seed implant in 2002. His PSA has been appropriately low. He had a bladder stone removed in 8/11. He returned in January 2013 with gross hematuria. He was seen by Jetta Lout, NPand had a negative culture. He was started on Tamsulosin for his voiding symptoms. A CT on August 18, 2011 showed possible bladder stones and a diverticulum, but on further review there was some bladder wall thickening and Dr. Annabell Howells was concerned about neoplasm. He underwent cystoscopy and resection of a bladder tumor on 10/03/2011. This revealed sarcomatoid urothelial carcinoma. There is no evidence of pelvic or distal metastatic disease. Dr. Bjorn Pippin referred him to me for discussion on cystectomy ,with consultation performed 10/26/2011. At that time, we discussed cystectomy, risks and complications. He desires to proceed        Technique and findings: The patient was properly identified in the holding area, and was taken to the operating room where general endotracheal anesthetic was established. NG tube was placed. Foley catheter was placed in the bladder following adequate prep and drape of the abdomen. Proper timeout was then performed.  An incision was then made from the pubis to just underneath the umbilicus. This was  carried down to fascia with electrocautery. The fascia was divided in the midline and access to the peritoneum was established. A Bookwalter retractor was in place, following general inspection of the abdomen. No significant intestinal adhesions were noted. There is no obvious adenopathy. The bladder mass was felt to be anterior and lateral within the bladder. The Foley balloon was easily palpable at the dome of the bladder. Following adequate packing the intestines superiorly, the urachus was taken down, and dissection was carried out laterally to the bladder bilaterally with the LigaSure device. Dissection was then carried down posteriorly on the lateral sides, thus easily making a peritoneal incision in the pouch of Douglas. Dissection was then carried out bluntly behind the bladder. There were no significant adhesions between the rectum and the bladder. Following for take down of the lateral peritoneum and the bladder leaves, as well as dissecting the space of Retzius bluntly (there were significant attachments between the pubis and the anterior bladder), the ureters were identified by incising the peritoneum on the stent the pelvis. Dissection was carried proximally and distally along the ureters, taking great care to keep the adventitia and surrounding fat around the ureteral wall. Dissection was carried down to the hiatus of the bladder bilaterally, and the ureters were clipped distally and divided. Dissection was then carried superiorly somewhat. The LigaSure device was used to perform the cystectomy, although the posterior pedicles were quite thick and allow the dissection was actually carried out with blunt dissection. There was no significant bleeding in this area. Following dissection of the bladder down inferiorly, dissection was carried anteriorly with either the LigaSure were or the Bovie, down to the urethra which was inferior to the prostate, identified by palpation. The anterior urethra was  transected. Dissection was then carried out behind the bladder all the way down to the inferior/apical edge of the prostate. The remaining apical tissue was taken down with electrocautery and the LigaSure. The bladder and prostate were then delivered from the field. It was evident that the seminal vesicles were remaining anterior to the rectum. The seminal vesicles were carefully dissected off the rectal wall, starting superiorly and and moving inferiorly, down to the area of the prostate/where the prostate was. The dissection was taken all the way inferiorly, and this tissue was then excised and passed as "seminal vesicles". There was some fibrotic tissue in the left posterior edge of the dissection which I worry was remaining cancer. This was sent for frozen section and returned fibrotic tissue only. Following this, FloSeal was placed in the dissection bed, after no significant bleeding was seen. Bilateral pelvic lymph node dissection was then performed, with the inferior edge of the dissection being the Cooper's ligaments bilaterally, superior extent of the dissection was the bifurcation of the common iliac vessels, anterior limit of the dissection was the external iliac artery and the posterior limit of the dissection was the obturator nerve. A second tissue was sent as "right" and "left" pelvic lymph nodes, respectively. These were sent for permanent section. Inspection of the dissection site revealed adequate hemostasis. Following this, creation of the ileal conduit was begun. An approximately 15 cm segment of the terminal ileum was identified there was significant thickened mesentery, creeping towards the antimesenteric side of the ileum throughout. Once the 15 cm segment was isolated, the mesentery was divided to form windows both at the proximal and distal extent of this into the ileal conduit. This was performed with the LigaSure. Dissection was carried down to the mesenteric border of the ileum proximally  and distally at these window sites. The GIA was then used to divide the ileum both proximally and distally, following skeletonization of the ileum and the both these areas. The ileal loops segment was then dropped inferiorly, and the ileoileostomy was then performed, using a GIA and TA stapler and a side-to-side, functional end to end fashion. The stapled anastomosis was then oversewn with 4-0 silk pop-off. The mesenteric window was closed. The left ureter was tunneled behind the sigmoid colon, and brought right beside the right ureter for the eventual anastomosis. Prior to performing the anastomosis of the ureters to the ileal loop, the distal end of the loop was brought up to the proposed ostomy site. This reached just about 1-2 cm away from the proposed site.  The ends of the ureters were spatulated.ureteral anastomoses with the ileal conduit were then performed following 2 separate enterotomies, one proximal end of the loop, and one approximately 1 1/2 cm away. Anastomoses were performed with 4-0 PDS. Prior to completion the anastomoses, 70 single J. stents were placed into each ureter, through the distal end of the ileal conduit. They were sutured in place to the wall the conduit with a 3-0 chromic. Following completion of each anastomosis, the conduit was filled with saline under pressure. A couple of leaks were noted on the left anastomosis, these were oversewn with the same 4-0 PDS. Following completion of this, the anastomoses were watertight. Inspection was turned toward the pelvis, small bleeder at the fibrotic biopsy site was noted and oversewn with 2-0 Vicryl. Following this, the ileal conduit was brought up to the proposed ostomy site, and a small button of skin excised approximately 2 cm below the proposed site. Dissection was carried down  to the rectus fascia in this area, which was incised in a cruciate fashion. A finger was then brought through this, into the peritoneal cavity. At this point, the  ostomy site was enlarged enough to admit 2 fingers. The distal end of the ileal conduit was then brought through the ostomy site. There was no significant tension at this point. The maturation of the stoma was performed in a Cortez fashion, however. 4-0 Vicryl was used to place quadrant sutures between the skin, the more proximal circumflex muscular layer, and then the mucosal edges of the intestinal wall. This performed a slightly everted ostomy. In between these, the edges of the ileal conduit were sutured to the skin with interrupted 4-0 Vicryl sutures. The distal end of the loop appeared viable at this point. Stents were trimmed. A 10 flat Blake drain was brought through the left lower quadrant and a separate stab incision, and laid dependently in the pelvis. It was sutured to the skin with a 3-0 nylon. At this point, sponge counts were correct. Inspection of the pelvis revealed adequate hemostasis. The pelvis was irrigated, and the fascia reapproximated with a running #1 PDS. Staples were placed on the skin, and a dry sterile dressing was placed. Ostomy appliance was placed.  This point, sponge needle instrument counts were correct x2. The patient was awakened and taken to PACU in stable condition.  Blood loss was approximately 1300 cc, 6000 cc of crystalloid and 500 cc of Hespan were administered. The patient was taken to the PACU in stable condition

## 2011-11-12 ENCOUNTER — Encounter (HOSPITAL_COMMUNITY): Payer: Self-pay | Admitting: Urology

## 2011-11-12 LAB — BASIC METABOLIC PANEL
CO2: 25 mEq/L (ref 19–32)
Calcium: 7.6 mg/dL — ABNORMAL LOW (ref 8.4–10.5)
Creatinine, Ser: 1.01 mg/dL (ref 0.50–1.35)
GFR calc Af Amer: 85 mL/min — ABNORMAL LOW (ref >90)
Sodium: 132 mEq/L — ABNORMAL LOW (ref 135–145)

## 2011-11-12 LAB — CBC
MCH: 28.3 pg (ref 26.0–34.0)
MCV: 82.9 fL (ref 78.0–100.0)
Platelets: 184 10*3/uL (ref 150–400)
RBC: 3.57 MIL/uL — ABNORMAL LOW (ref 4.22–5.81)
RDW: 14 % (ref 11.5–15.5)
WBC: 8.5 10*3/uL (ref 4.0–10.5)

## 2011-11-12 NOTE — Progress Notes (Signed)
Tx to room 1410 by WC. Report to [facility]

## 2011-11-12 NOTE — Progress Notes (Signed)
1 Day Post-Op Subjective: Patient reports he is not had flatus yet. He has had no nausea or vomiting.  Objective: Vital signs in last 24 hours: Temp:  [97.6 F (36.4 C)-98.6 F (37 C)] 98.5 F (36.9 C) (05/02 0400) Pulse Rate:  [75-96] 87  (05/02 0600) Resp:  [12-21] 17  (05/02 0600) BP: (102-139)/(60-77) 102/60 mmHg (05/02 0600) SpO2:  [94 %-100 %] 97 % (05/02 0600) Weight:  [100.7 kg (222 lb 0.1 oz)-101.1 kg (222 lb 14.2 oz)] 100.7 kg (222 lb 0.1 oz) (05/02 0000)  Lungs revealed clear sounds bilaterally. There is adequate respiratory excursion  Abdomen is slightly distended. Dressing is dry. Stoma looks viable. Minimal bowel sounds Extremities revealed 1+ pretibial edema. No tenderness. Intake/Output from previous day: 05/01 0701 - 05/02 0700 In: 9587.5 [I.V.:8837.5; IV Piggyback:750] Out: 3510 [Urine:2100; Drains:110; Blood:1300] Intake/Output this shift:    Physical Exam:  Constitutional: Vital signs reviewed. WD WN in NAD   Eyes: PERRL, No scleral icterus.   Cardiovascular: RRR Pulmonary/Chest: Normal effort Abdominal: Soft. Non-tender, non-distended, bowel sounds are normal, no masses, organomegaly, or guarding present.  Genitourinary: Extremities: No cyanosis or edema   Lab Results:  Basename 11/12/11 0340 11/11/11 1854 11/11/11 1053  HGB 10.1* 10.5* 8.5*  HCT 29.6* 30.9* 25.0*   BMET  Basename 11/12/11 0340 11/11/11 1053  NA 132* 135  K 3.9 3.5  CL 101 --  CO2 25 --  GLUCOSE 138* 124*  BUN 14 --  CREATININE 1.01 --  CALCIUM 7.6* --   No results found for this basename: LABPT:3,INR:3 in the last 72 hours No results found for this basename: LABURIN:1 in the last 72 hours Results for orders placed during the hospital encounter of 11/11/11  MRSA PCR SCREENING     Status: Normal   Collection Time   11/11/11  3:35 PM      Component Value Range Status Comment   MRSA by PCR NEGATIVE  NEGATIVE  Final     Studies/Results: No results  found.  Assessment/Plan:   Postoperative day #1 radical cystoprostatectomy, ileal conduit urinary diversion. He seems to be doing well. He is hemodynamically stable. I will transfer him to the fourth floor. We will recheck his labs in the morning. Continue ice chips only. Out of bed.  LOS: 1 day   [name] M 11/12/2011, 7:01 AM

## 2011-11-12 NOTE — Clinical Documentation Improvement (Signed)
Anemia Blood Loss Clarification  THIS DOCUMENT IS NOT A PERMANENT PART OF THE MEDICAL RECORD  RESPOND TO THE THIS QUERY, FOLLOW THE INSTRUCTIONS BELOW:  1. If needed, update documentation for the patient's encounter via the notes activity.  2. Access this query again and click edit on the In [employer].  3. After updating, or not, click F2 to complete all highlighted (required) fields concerning your review. Select "additional documentation in the medical record" OR "no additional documentation provided".  4. Click Sign note button.  5. The deficiency will fall out of your In Basket *Please let us know if you are not able to complete this workflow by phone or e-mail (listed below).        11/12/11  Dear Dr. Retta Diones, Kathie Rhodes Marton Redwood  In an effort to better capture your patient's severity of illness, reflect appropriate length of stay and utilization of resources, a review of the patient medical record has revealed the following indicators.    Based on your clinical judgment, please clarify and document in a progress note and/or discharge summary the clinical condition associated with the following supporting information:  In responding to this query please exercise your independent judgment.  The fact that a query is asked, does not imply that any particular answer is desired or expected.  Pt with Urothelial/sarcomatoid tumor of the bladder  According to lab on 11/11/11  pt's H/H=8.5/25.0 s/p Radical cystoprostatectomy  Please clarify based on abnormal H/H can be further specified as one of the diagnoses listed below and document in pn or d/c summary.   Possible Clinical Conditions?   " Expected Acute Blood Loss Anemia  " Acute Blood Loss Anemia  " Acute on chronic blood loss anemia  " Other Condition________________  " Cannot Clinically Determine  Risk Factors: (recent surgery, pre op anemia, EBL in OR)  Supporting Information:  Signs and Symptoms    Urothelial/sarcomatoid tumor of the bladder, Radical cystoprostatectomy  Diagnostics: Component     Latest Ref Rng 11/11/2011 11/11/2011 11/12/2011  Hemoglobin     13.0 - 17.0 g/dL 8.5 (L) 09.8 (L) 11.9 (L)  HCT     39.0 - 52.0 % 25.0 (L) 30.9 (L) 29.6 (L)    Treatments: Monitoring  Reviewed:  no additional documentation provided ljh  Thank You,  Enis Slipper  RN, BSN, CCDS Clinical Documentation Specialist [facility] Dept Pager: [phone] / E-mail: [email or web address]  Health Information Management [place]

## 2011-11-12 NOTE — Consult Note (Signed)
WOC ostomy consult  Stoma type/location: Ostomy follow up for ileal conduit placement.    Stoma difficult to visualize because of sanguinous drainage surrounding ostomy site.  Feels flat when palpated.  Two piece pouch intact with good seal.  Small amount of yellow colored urine noted in bag.  2 stents in place.  Informed that we return tomorrow for pouch change demo.  Briefly discussed pouching routines.  Patient plans for brother to included in education of pouch change procedure on tomorrow. Supplies order for bedside nurse use.    [employer], BSN, WOC nurse/Mikinzie Maciejewski Sylvie Farrier, MSN. Rober Minion

## 2011-11-12 NOTE — Progress Notes (Signed)
CARE MANAGEMENT NOTE 11/12/2011  Patient:  Joe Park, Joe Park   Account Number:  [id number]  Date Initiated:  11/12/2011  Documentation initiated by:  Waco Foerster  Subjective/Objective Assessment:   pt with hx of prostate ca and metas had formation of ileo-conduit on [id number].     Action/Plan:   Spoke with the POA Laquon Emel like to use [facility] or [facility] if needed for post hospital care.   Anticipated DC Date:  11/15/2011   Anticipated DC Plan:  HOME W HOME HEALTH SERVICES  In-house referral  NA      DC Planning Services  CM consult      [facility] Choice  HOME HEALTH   Choice offered to / List presented to:  C-2 HC POA / Guardian   DME arranged  NA      DME agency  NA     HH arranged  NA      HH agency  NA   Status of service:  In process, will continue to follow Medicare Important Message given?  YES (If response is "NO", the following Medicare IM given date fields will be blank) Date Medicare IM given:  11/11/2011 Date Additional Medicare IM given:    Discharge Disposition:    Per UR Regulation:  Reviewed for med. necessity/level of care/duration of stay  If discussed at Long Length of Stay Meetings, dates discussed:    Comments:  [id number]/Ruth Tully Earlene Plater, RN, BSN, CCM No discharge needs present at time of this review at the sdu/icu level. Case Management [phone]

## 2011-11-13 LAB — BASIC METABOLIC PANEL
BUN: 14 mg/dL (ref 6–23)
CO2: 24 mEq/L (ref 19–32)
Calcium: 7.8 mg/dL — ABNORMAL LOW (ref 8.4–10.5)
Creatinine, Ser: 1.01 mg/dL (ref 0.50–1.35)
GFR calc non Af Amer: 73 mL/min — ABNORMAL LOW (ref >90)
Glucose, Bld: 134 mg/dL — ABNORMAL HIGH (ref 70–99)
Sodium: 130 mEq/L — ABNORMAL LOW (ref 135–145)

## 2011-11-13 MED ORDER — ENOXAPARIN SODIUM 40 MG/0.4ML ~~LOC~~ SOLN
40.0000 mg | SUBCUTANEOUS | Status: DC
  Administered 2011-11-13 – 2011-11-18 (×6): 40 mg via SUBCUTANEOUS
  Filled 2011-11-13 (×6): qty 0.4

## 2011-11-13 MED ORDER — KETOROLAC TROMETHAMINE 60 MG/2ML IM SOLN: 60.0000 mg | Freq: Three times a day (TID) | INTRAMUSCULAR | Status: AC | PRN

## 2011-11-13 MED ORDER — ACETAMINOPHEN 10 MG/ML IV SOLN
1000.0000 mg | Freq: Four times a day (QID) | INTRAVENOUS | Status: AC
  Administered 2011-11-13 – 2011-11-14 (×4): 1000 mg via INTRAVENOUS
  Filled 2011-11-13 (×4): qty 100

## 2011-11-13 MED ORDER — BISACODYL 10 MG RE SUPP
10.0000 mg | Freq: Once | RECTAL | Status: AC
  Administered 2011-11-13: 10 mg via RECTAL
  Filled 2011-11-13: qty 1

## 2011-11-13 MED ORDER — ZOLPIDEM TARTRATE 5 MG PO TABS
5.0000 mg | ORAL_TABLET | Freq: Every evening | ORAL | Status: DC | PRN
  Administered 2011-11-13 – 2011-11-16 (×4): 5 mg via ORAL
  Filled 2011-11-13 (×4): qty 1

## 2011-11-13 NOTE — Progress Notes (Signed)
  Pharmacy Note (Brief) Lovenox for VTE Prophylaxis  SCr = 1.01, CrCl 80 ml/min H/H 10.2/30.7 Plt = 184 Ht= 6'  Wt 100.7kg   BMI = 30  Order for Lovenox per Pharmacy for VTE prophylaxis. Post op Day #2 (s/p radical cystoprostatectomy). SCr wnl and stable.   Plan: 1) Lovenox 40mg  SQ q24h 2) Will sign off. Please reconsult if further assistance is desired.  Darrol Angel, PharmD Pager: [phone] 11/13/2011 8:33 AM

## 2011-11-13 NOTE — Consult Note (Addendum)
WOC ostomy consult  Stoma type/location:   Right upper quadrant ileal conduit  Follow up post urostomy placement.  Educated on pouch change procedure.  Brother at bedside during demonstration. 1 piece pouch applied with Barrier ring to add convexity.  Pouch cut approximately 1 inch for stoma.  2 stents in place, constant trickle of clear yellow urine noted.  1 inch stoma, flat in appearance, stoma moist and pink.   Skin intact without erythema.  Evidence of small amount of leakage under faceplate noted with change.  Allowed for return demonstration of drainage bag application and removal by patient and brother.  Stressed importance of maintaining stents in place and advised that they would fall out on their own. Patient able to connect and disconnect bag without difficulty, discussed ordering routines, as well as home health to follow up when discharged.  Placed on [place] discharge program.  Reynold Bowen RN, BSN, WOC nurse/Haddy Mullinax Sylvie Farrier MSN, [employer]

## 2011-11-13 NOTE — Progress Notes (Signed)
2 Days Post-Op Subjective: Patient reports that he has had some flatus. Minimal nausea, no emesis.  Objective: Vital signs in last 24 hours: Temp:  [97.7 F (36.5 C)-99.4 F (37.4 C)] 99 F (37.2 C) (05/02 2102) Pulse Rate:  [79-95] 82  (05/02 2102) Resp:  [14-23] 20  (05/03 0344) BP: (96-136)/(59-75) 96/62 mmHg (05/02 2102) SpO2:  [96 %-100 %] 96 % (05/03 0344) FiO2 (%):  [95 %] 95 % (05/03 0344)    Hematocrit is stable at 30.7%, basic metabolic panel is normal. Intake/Output from previous day: 05/02 0701 - 05/03 0700 In: 1585.4 [I.V.:1385.4; IV Piggyback:200] Out: 1415 [Urine:1380; Drains:35] Intake/Output this shift: Total I/O In: -  Out: 405 [Urine:400; Drains:5]  Physical Exam:  Abdomen is slightly distended, no significant tenderness. Dressing is dry. Ostomy looks viable. There is no extremity tenderness.  Lab Results:  Basename 11/12/11 0340 11/11/11 1854 11/11/11 1053  HGB 10.1* 10.5* 8.5*  HCT 29.6* 30.9* 25.0*   BMET  Basename 11/12/11 0340 11/11/11 1053  NA 132* 135  K 3.9 3.5  CL 101 --  CO2 25 --  GLUCOSE 138* 124*  BUN 14 --  CREATININE 1.01 --  CALCIUM 7.6* --   No results found for this basename: LABPT:3,INR:3 in the last 72 hours No results found for this basename: LABURIN:1 in the last 72 hours Results for orders placed during the hospital encounter of 11/11/11  MRSA PCR SCREENING     Status: Normal   Collection Time   11/11/11  3:35 PM      Component Value Range Status Comment   MRSA by PCR NEGATIVE  NEGATIVE  Final     Studies/Results: No results found.  Assessment/Plan:   Postoperative day 2 him a radical cystoprostatectomy, ileal conduit formation. He seems to be doing well. He is somewhat distended, but has had flatus.    I will discontinue his PCA pump, he will be ambulated, I will give him a Dulcolax suppository. Lovenox will be started.   LOS: 2 days   [name] M 11/13/2011, 6:44 AM

## 2011-11-14 MED ORDER — SODIUM CHLORIDE 0.9 % IJ SOLN
3.0000 mL | Freq: Two times a day (BID) | INTRAMUSCULAR | Status: DC
  Administered 2011-11-14 – 2011-11-18 (×8): 3 mL via INTRAVENOUS

## 2011-11-14 NOTE — Progress Notes (Signed)
3 Days Post-Op Subjective: Patient reports Pain 01-/10. Tolerating clear liquids.   Objective: Vital signs in last 24 hours: Temp:  [97.8 F (36.6 C)-98.7 F (37.1 C)] 97.8 F (36.6 C) (05/04 0449) Pulse Rate:  [84-99] 99  (05/04 0449) Resp:  [17-19] 19  (05/04 0449) BP: (99-106)/(63-70) 106/70 mmHg (05/04 0449) SpO2:  [93 %-95 %] 95 % (05/04 0449)  Intake/Output from previous day: 05/03 0701 - 05/04 0700 In: 3390.4 [P.O.:480; I.V.:2810.4; IV Piggyback:100] Out: 2692 [Urine:2675; Drains:15; Stool:2] Intake/Output this shift:    Physical Exam:  General: Alert no distress  GI: soft passing flatus had first incontinent this am. Dark sticky liquid stool Ielostomy intact draining straw colored urine with some sediment  Lab Results:  Basename 11/13/11 0704 11/12/11 0340 11/11/11 1854  HGB 10.2* 10.1* 10.5*  HCT 30.7* 29.6* 30.9*   BMET  Basename 11/13/11 0704 11/12/11 0340  NA 130* 132*  K 3.8 3.9  CL 99 101  CO2 24 25  GLUCOSE 134* 138*  BUN 14 14  CREATININE 1.01 1.01  CALCIUM 7.8* 7.6*   No results found for this basename: LABPT:3,INR:3 in the last 72 hours No results found for this basename: LABURIN:1 in the last 72 hours Results for orders placed during the hospital encounter of 11/11/11  MRSA PCR SCREENING     Status: Normal   Collection Time   11/11/11  3:35 PM      Component Value Range Status Comment   MRSA by PCR NEGATIVE  NEGATIVE  Final     Studies/Results: No results found.  Assessment/Plan: Status post cystoprostatectomy ileal conduit  May advance diet as tolerated Ambulated in hall TID today   LOS: 3 days   [facility] 11/14/2011, 8:31 AM

## 2011-11-15 NOTE — Progress Notes (Signed)
4 Days Post-Op Subjective: Patient reports tolerating PO. Eating regular diet w/o problems. Pain 0/10  Objective: Vital signs in last 24 hours: Temp:  [98.4 F (36.9 C)-98.8 F (37.1 C)] 98.4 F (36.9 C) (05/05 0522) Pulse Rate:  [84-96] 84  (05/05 0522) Resp:  [18-20] 20  (05/05 0522) BP: (103-117)/(69-73) 117/73 mmHg (05/05 0522) SpO2:  [95 %-100 %] 95 % (05/05 0522)  Intake/Output from previous day: 05/04 0701 - 05/05 0700 In: 3695.5 [P.O.:1320; I.V.:2375.5] Out: 823 [Urine:800; Drains:20; Stool:3] Intake/Output this shift:    Physical Exam:  General:alert and no distress GI: not done and soft, non tender, normal bowel sounds, no palpable masses, no organomegaly, no inguinal hernia ileal stomy pink Resp: clear to auscultation bilaterally Cardio: regular rate and rhythm Extremities: no edema, redness or tenderness in the calves or thighs  Lab Results:  Basename 11/13/11 0704  HGB 10.2*  HCT 30.7*   BMET  Basename 11/13/11 0704  NA 130*  K 3.8  CL 99  CO2 24  GLUCOSE 134*  BUN 14  CREATININE 1.01  CALCIUM 7.8*   No results found for this basename: LABPT:3,INR:3 in the last 72 hours No results found for this basename: LABURIN:1 in the last 72 hours Results for orders placed during the hospital encounter of 11/11/11  MRSA PCR SCREENING     Status: Normal   Collection Time   11/11/11  3:35 PM      Component Value Range Status Comment   MRSA by PCR NEGATIVE  NEGATIVE  Final     Studies/Results: No results found.  Assessment/Plan: 4 Days Post-Op Procedure(s) (LRB): CYSTECTOMY COMPLETE (N/A) LYMPH NODE DISSECTION (N/A) Status post cystoprostatectomy ileal conduit Anticipate d/c in am Will change IV to NSL today   LOS: 4 days   [facility] 11/15/2011, 7:14 AM

## 2011-11-15 NOTE — Progress Notes (Signed)
Pharmacy Brief Note - Alvimopan (Entereg)  The standing order set for alvimopan (Entereg) now includes an automatic order to discontinue the drug after the patient has had a bowel movement.  The change was approved by the Pharmacy & Therapeutics Committee and the Medical Executive Committee.    This patient has had a bowel movement documented by nursing on 11/14/11.  Therefore, alvimopan has been discontinued.  If there are questions, please contact the pharmacy at [phone].  Thank you-

## 2011-11-15 NOTE — Plan of Care (Signed)
Problem: Phase II Progression Outcomes Goal: Foley discontinued Outcome: Not Applicable Date Met:  11/15/11 Pt has ileal conduit  Problem: Phase III Progression Outcomes Goal: Voiding independently Outcome: Not Applicable Date Met:  11/15/11 Ileal conduit

## 2011-11-16 NOTE — Progress Notes (Signed)
5 Days Post-Op Subjective: Patient reports feeling fine. + BM  Objective: Vital signs in last 24 hours: Temp:  [98.4 F (36.9 C)-98.7 F (37.1 C)] 98.4 F (36.9 C) (05/06 0607) Pulse Rate:  [76-82] 76  (05/06 0607) Resp:  [16-18] 16  (05/06 0607) BP: (115-127)/(74-76) 122/76 mmHg (05/06 0607) SpO2:  [95 %-97 %] 95 % (05/06 0607)  Intake/Output from previous day: 05/05 0701 - 05/06 0700 In: 1560 [P.O.:1560] Out: 1215 [Urine:1200; Drains:13; Stool:2] Intake/Output this shift: Total I/O In: 240 [P.O.:240] Out: 2 [Urine:1; Stool:1]  Physical Exam:  Constitutional: Vital signs reviewed. WD WN in NAD   Eyes: PERRL, No scleral icterus.   Soft, nondistended nontender. Bowel sounds are present  Lab Results: No results found for this basename: HGB:3,HCT:3 in the last 72 hours BMET No results found for this basename: NA:2,K:2,CL:2,CO2:2,GLUCOSE:2,BUN:2,CREATININE:2,CALCIUM:2 in the last 72 hours No results found for this basename: LABPT:3,INR:3 in the last 72 hours No results found for this basename: LABURIN:1 in the last 72 hours Results for orders placed during the hospital encounter of 11/11/11  MRSA PCR SCREENING     Status: Normal   Collection Time   11/11/11  3:35 PM      Component Value Range Status Comment   MRSA by PCR NEGATIVE  NEGATIVE  Final     Studies/Results: No results found.  Assessment/Plan  Postoperative day #5 radical cystectomy. He is doing well.  Diet is advanced. We will get skilled nursing placement.   LOS: 5 days   [name] M 11/16/2011, 9:51 AM

## 2011-11-16 NOTE — Care Management Note (Signed)
    Page 1 of 1   11/18/2011     11:23:40 AM   CARE MANAGEMENT NOTE 11/18/2011  Patient:  Joe Park, Joe Park   Account Number:  [id number]  Date Initiated:  11/12/2011  Documentation initiated by:  DAVIS,RHONDA  Subjective/Objective Assessment:   pt with hx of prostate ca and metas had formation of ileo-conduit on [id number].     Action/Plan:   Spoke with the POA Koda Routon like to use [facility] or [facility] if needed for post hospital care.   Anticipated DC Date:  11/18/2011   Anticipated DC Plan:  HOME W HOME HEALTH SERVICES  In-house referral  NA      DC Planning Services  CM consult      [facility] Choice  HOME HEALTH   Choice offered to / List presented to:  C-2 HC POA / Guardian   DME arranged  NA      DME agency  NA     HH arranged  HH-1 RN      [facility] agency  [facility]   Status of service:  Completed, signed off Medicare Important Message given?  YES (If response is "NO", the following Medicare IM given date fields will be blank) Date Medicare IM given:  11/11/2011 Date Additional Medicare IM given:    Discharge Disposition:  HOME W HOME HEALTH SERVICES  Per UR Regulation:  Reviewed for med. necessity/level of care/duration of stay  If discussed at Long Length of Stay Meetings, dates discussed:    Comments:  11/18/11 [facility] RN,BSN NCM [phone] [facility] CHOSEN (SUSAN DALE LIASON)FOR HHRN-UROSTOMY INSTRUCTION.  11/16/11 Delois Tolbert RN,BSN NCM [phone] [facility] AGENCY LIST GIVEN PER MD RECOMMENDATION HHRN @ D/C.  [id number] Davis, RN, BSN, CCM No discharge needs present at time of this review at the sdu/icu level. Case Management [phone]

## 2011-11-17 NOTE — Progress Notes (Signed)
Patient ID: Joe Park, male   DOB: 07-18-1940, 71 y.o.   MRN: [id number] 6 Days Post-Op  Subjective: Joe Park is doing well without complaints.  He is tolerating a regular diet and has good UOP with minimal JP drainage.   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 ROS: Negative except as above.  Objective: Vital signs in last 24 hours: Temp:  [97.8 F (36.6 C)-98.5 F (36.9 C)] 97.8 F (36.6 C) (05/07 0440) Pulse Rate:  [75-80] 75  (05/07 0440) Resp:  [16-20] 20  (05/07 0440) BP: (132-138)/(77-81) 132/77 mmHg (05/07 0440) SpO2:  [96 %-97 %] 96 % (05/07 0440)  Intake/Output from previous day: 05/06 0701 - 05/07 0700 In: 720 [P.O.:720] Out: 1042 [Urine:1027; Drains:13; Stool:2] Intake/Output this shift:    General appearance: alert and no distress Resp: clear to auscultation bilaterally Cardio: regular rate and rhythm GI: Soft, non-tender with + BS.   Stoma pink and productive.  Wound intact without erythema.  Lab Results:  No results found for this basename: WBC:2,HGB:2,HCT:2,PLT:2 in the last 72 hours BMET No results found for this basename: NA:2,K:2,CL:2,CO2:2,GLUCOSE:2,BUN:2,CREATININE:2,CALCIUM:2 in the last 72 hours PT/INR No results found for this basename: LABPROT:2,INR:2 in the last 72 hours ABG No results found for this basename: PHART:2,PCO2:2,PO2:2,HCO3:2 in the last 72 hours  Studies/Results: No results found.  Anti-infectives: Anti-infectives     Start     Dose/Rate Route Frequency Ordered Stop   11/11/11 1500   ceFAZolin (ANCEF) IVPB 1 g/50 mL premix        1 g 100 mL/hr over 30 Minutes Intravenous 4 times per day 11/11/11 1302 11/11/11 1541   11/11/11 0701   ceFAZolin (ANCEF) IVPB 2 g/50 mL premix        2 g 100 mL/hr over 30 Minutes Intravenous 30 min pre-op 11/11/11 0701 11/11/11 0852          Current Facility-Administered Medications  Medication Dose Route Frequency Provider Last  Rate Last Dose  . enoxaparin (LOVENOX) injection 40 mg  40 mg Subcutaneous Q24H Annia Belt, PHARMD   40 mg at 11/16/11 1030  . ketorolac (TORADOL) injection 60 mg  60 mg Intramuscular TID PRN [name], MD      . levothyroxine (SYNTHROID, LEVOTHROID) tablet 125 mcg  125 mcg Oral Q breakfast [name], MD   125 mcg at 11/16/11 0830  . lisinopril (PRINIVIL,ZESTRIL) tablet 40 mg  40 mg Oral Daily [name], MD   40 mg at 11/16/11 1045  . mirtazapine (REMERON) tablet 15 mg  15 mg Oral QHS [name], MD   15 mg at 11/16/11 2136  . mulitivitamin with minerals tablet 1 tablet  1 tablet Oral Daily [name], MD   1 tablet at 11/16/11 1030  . simvastatin (ZOCOR) tablet 40 mg  40 mg Oral QHS [name], MD   40 mg at 11/16/11 2136  . sodium chloride 0.9 % injection 3 mL  3 mL Intravenous Q12H [name], MD   3 mL at 11/16/11 2137  . zolpidem (AMBIEN) tablet 5 mg  5 mg Oral QHS PRN [name], MD   5 mg at 11/16/11 2334    Assessment: s/p Procedure(s): CYSTECTOMY COMPLETE LYMPH NODE DISSECTION Doing well but he has a positive surgical margin. Plan: He is awaiting placement.   LOS: 6 days    Joe Park J 11/17/2011

## 2011-11-18 NOTE — Evaluation (Signed)
Physical Therapy Evaluation Patient Details Name: Joe Park MRN: [id number] DOB: 02-04-1941 Today's Date: 11/18/2011 Time: 6962-9528 PT Time Calculation (min): 23 min  PT Assessment / Plan / Recommendation Clinical Impression  Pt s/p radical cystoprostatectomy 11/11/11. Plans to d/c home today. No lob during eval. Anticipate pt will function well in home environment with slow increase in activity. No follow up PT needs. 1x eval.     PT Assessment  Patent does not need any further PT services    Follow Up Recommendations  No PT follow up    Equipment Recommendations  None recommended by PT    Frequency      Precautions / Restrictions Precautions Precautions: None Restrictions Weight Bearing Restrictions: No   Pertinent Vitals/Pain       Mobility  Bed Mobility Bed Mobility: Supine to Sit Supine to Sit: 6: Modified independent (Device/Increase time) Sitting - Scoot to Edge of Bed: 7: Independent Transfers Transfers: Sit to Stand;Stand to Sit Sit to Stand: 6: Modified independent (Device/Increase time);From bed Stand to Sit: 6: Modified independent (Device/Increase time);To chair/3-in-1 Ambulation/Gait Ambulation/Gait Assistance: 5: Supervision Ambulation Distance (Feet): 200 Feet Assistive device: None Ambulation/Gait Assistance Details: pt c/o feeling wobbly. no lob. had pt perform 360 degree turn-no difficulty. supervision level only b/c pt c/o lightheadedness/wobbly. no assist needed. Gait Pattern: Within Functional Limits Stairs: Yes Stairs Assistance: 6: Modified independent (Device/Increase time) Stair Management Technique: One rail Right Number of Stairs: 2     Exercises     PT Goals    Visit Information  Last PT Received On: 11/18/11 Assistance Needed: +1    Subjective Data  Subjective: "I feel fine" Patient Stated Goal: home   Prior Functioning  Home Living Lives With: Alone Available Help at Discharge: Family Type of Home: House Home  Access: Stairs to enter Secretary/administrator of Steps: 2 Entrance Stairs-Rails: None Home Layout: One level Bathroom Shower/Tub: Engineer, manufacturing systems: Standard Home Adaptive Equipment: Environmental consultant - rolling;Straight cane Additional Comments: not using assistive devices Prior Function Level of Independence: Independent Able to Take Stairs?: Yes Driving: Yes Vocation: Retired Musician: Expressive difficulties Dominant Hand: Right    Cognition  Overall Cognitive Status: Appears within functional limits for tasks assessed/performed Arousal/Alertness: Awake/alert Orientation Level: Appears intact for tasks assessed Behavior During Session: [facility] for tasks performed    Extremity/Trunk Assessment Right Upper Extremity Assessment RUE ROM/Strength/Tone: Within functional levels RUE Coordination: WFL - gross/fine motor Left Upper Extremity Assessment LUE ROM/Strength/Tone: Within functional levels LUE Coordination: WFL - gross/fine motor Right Lower Extremity Assessment RLE ROM/Strength/Tone: Within functional levels RLE Coordination: WFL - gross motor Left Lower Extremity Assessment LLE ROM/Strength/Tone: Within functional levels LLE Coordination: WFL - gross motor Trunk Assessment Trunk Assessment: Normal   Balance    End of Session PT - End of Session Activity Tolerance: Patient tolerated treatment well Patient left: in chair;with call bell/phone within reach   Joe Park 11/18/2011, 9:16 AM [phone]

## 2011-11-18 NOTE — Progress Notes (Signed)
D/C PT. JP. DRAIN PT. TOLERATED WELL.  WILL CONTINUE TO MONITOR AND OBSERVE PT.

## 2011-11-18 NOTE — Consult Note (Signed)
WOC ostomy consult  Stoma type/location: RUQ Ileal conduitStomal assessment/size:  Peristomal assessment: Intact, clear Treatment options for stomal/peristomal skin: None indicated Output clear yellow urine Ostomy pouching: 1pc. Flat urostomy pouch (Lawson#3) with Corporate treasurer [phone]). Education provided: Previously provided education reviewed and patient does well with cueing.  Will appreciate assistance from brother and sister-in-law as well as from [facility] in these early days of adaptation with a new ostomy.  Patient was still using bedside drainage bag and instructed/reminded that this is only used at hs.  During the day, patient is able to wear only urostomy pouch, and will empty when pouch is 1/3 to 1/2 full of urine.  Patient reassured that he is going to be able to return to church. Patient is ready for discharge to the care of his family and with support from the [facility].  WIll follow up with MD.   Thanks, Ladona Mow, MSN, RN, [facility], CWOCN [phone])

## 2011-11-18 NOTE — Discharge Instructions (Signed)
1. It is ok to shower  2. Regular diet  3. Advil/tylenol or aleve as needed for pain  4. No heavy lifting or physical exertion for about one month  5. It is OK to drive in 1 week  6. Notify Dr. Retta Diones if you have nausea, vomiting, fever or chills, or any significant change in status. Phone [phone]

## 2011-11-18 NOTE — Evaluation (Signed)
Occupational Therapy Evaluation Patient Details Name: Joe Park MRN: [id number] DOB: March 30, 1941 Today's Date: 11/18/2011 Time: 4098-1191 OT Time Calculation (min): 22 min  OT Assessment / Plan / Recommendation Clinical Impression  Pt is a 71 year old man admitted for cystectomy as treatment for invasive sarcomatoid carcinoma of the bladder.  Pt is independent in ADL and transfers, supervision for ambulation due to mild weakness.  No further OT needs.  Recommend home with intermittent assist of his brother.         OT Assessment  Patient does not need any further OT services    Follow Up Recommendations  No OT follow up;Supervision - Intermittent    Equipment Recommendations  None recommended by PT          Precautions / Restrictions Precautions Precautions: None Restrictions Weight Bearing Restrictions: No        ADL  Eating/Feeding: Performed;Independent Where Assessed - Eating/Feeding: Bed level Grooming: Performed;Wash/dry hands;Independent Where Assessed - Grooming: Standing at sink Upper Body Bathing: Simulated;Independent Where Assessed - Upper Body Bathing: Standing at sink Lower Body Bathing: Simulated;Independent Where Assessed - Lower Body Bathing: Sitting, bed;Sit to stand from bed Upper Body Dressing: Simulated;Independent Where Assessed - Upper Body Dressing: Sitting, bed Lower Body Dressing: Performed;Independent Where Assessed - Lower Body Dressing: Sitting, chair Toilet Transfer: Performed;Independent Toilet Transfer Method: Proofreader: Regular height toilet Toileting - Clothing Manipulation: Performed;Independent Where Assessed - Toileting Clothing Manipulation: Standing Toileting - Hygiene: Performed;Independent Where Assessed - Toileting Hygiene: Sit to stand from 3-in-1 or toilet Ambulation Related to ADLs: Independent to supervision due to pts report of feeling "wobbly."        Visit Information  Assistance  Needed: +1 PT/OT Co-Evaluation/Treatment: Yes    Subjective Data  Subjective: "I feel a little wobbly." Patient Stated Goal: Home with assist of brother as needed.   Prior Functioning  Home Living Lives With: Alone Available Help at Discharge: Family Type of Home: House Home Access: Stairs to enter Secretary/administrator of Steps: 2 Entrance Stairs-Rails: None Home Layout: One level Bathroom Shower/Tub: Engineer, manufacturing systems: Standard Home Adaptive Equipment: Environmental consultant - rolling;Straight cane Additional Comments: not using assistive devices Prior Function Level of Independence: Independent Able to Take Stairs?: Yes Driving: Yes Vocation: Retired Musician: Expressive difficulties Dominant Hand: Right    Cognition  Overall Cognitive Status: Appears within functional limits for tasks assessed/performed Arousal/Alertness: Awake/alert Orientation Level: Appears intact for tasks assessed Behavior During Session: [facility] for tasks performed    Extremity/Trunk Assessment Right Upper Extremity Assessment RUE ROM/Strength/Tone: Within functional levels RUE Coordination: WFL - gross/fine motor Left Upper Extremity Assessment LUE ROM/Strength/Tone: Within functional levels LUE Coordination: WFL - gross/fine motor Right Lower Extremity Assessment RLE ROM/Strength/Tone: Within functional levels RLE Coordination: WFL - gross motor Left Lower Extremity Assessment LLE ROM/Strength/Tone: Within functional levels LLE Coordination: WFL - gross motor Trunk Assessment Trunk Assessment: Normal   Mobility Bed Mobility Bed Mobility: Supine to Sit Supine to Sit: 6: Modified independent (Device/Increase time) Sitting - Scoot to Edge of Bed: 7: Independent Transfers Sit to Stand: 6: Modified independent (Device/Increase time);From bed Stand to Sit: 6: Modified independent (Device/Increase time);To chair/3-in-1           End of Session OT - End of Session Activity  Tolerance: Patient tolerated treatment well Patient left: in chair;with call bell/phone within reach Nurse Communication: Other (comment) (pt with questions about reflux, catheter care)   Evern Bio 11/18/2011, 9:17 AM [phone]

## 2011-11-18 NOTE — Discharge Summary (Signed)
Patient ID: Joe Park MRN: [id number] DOB/AGE: April 23, 1941 71 y.o.  Admit date: 11/11/2011 Discharge date: 11/18/2011  Primary Care Physician:  Garlan Fillers, MD, MD  Discharge Diagnoses:   Sarcomatoid bladder cancer History of prostate cancer Consults:  None    Discharge Medications: Medication List  As of 11/18/2011  8:26 AM   TAKE these medications         aspirin 81 MG chewable tablet   Chew 81 mg by mouth daily with breakfast.      docusate sodium 100 MG capsule   Commonly known as: COLACE   Take 100 mg by mouth 2 (two) times daily.      hydrochlorothiazide 25 MG tablet   Commonly known as: HYDRODIURIL   Take 12.5 mg by mouth every morning.      levothyroxine 125 MCG tablet   Commonly known as: SYNTHROID, LEVOTHROID   Take 125 mcg by mouth daily with breakfast.      lisinopril 40 MG tablet   Commonly known as: PRINIVIL,ZESTRIL   Take 40 mg by mouth every morning.      mirtazapine 15 MG tablet   Commonly known as: REMERON   Take 15 mg by mouth at bedtime.      mulitivitamin with minerals Tabs   Take 1 tablet by mouth every morning.      simvastatin 40 MG tablet   Commonly known as: ZOCOR   Take 40 mg by mouth at bedtime.             Significant Diagnostic Studies:  No results found.  Brief H and P: For complete details please refer to admission H and P, but in brief the patient was admitted for cystectomy as treatment for invasive sarcomatoid carcinoma of the bladder.  Hospital Course:  The patient was admitted on the day of surgery. He underwent radical cystoprostatectomy and pelvic lymph node dissection. Postoperative course was without complication, and he was discharged home on postoperative day #7.  Day of Discharge BP 114/73  Pulse 70  Temp(Src) 98.5 F (36.9 C) (Oral)  Resp 19  Ht 6' (1.829 m)  Wt 100.7 kg (222 lb 0.1 oz)  BMI 30.11 kg/m2  SpO2 97%  No results found for this or any previous visit (from the past 24  hour(s)).  Physical Exam: General: Alert and awake oriented x3 not in any acute distress. HEENT: anicteric sclera, pupils reactive to light and accommodation CVS: S1-S2 clear no murmur rubs or gallops Chest: clear to auscultation bilaterally, no wheezing rales or rhonchi Abdomen: soft nontender, nondistended, normal bowel sounds, no organomegaly Extremities: no cyanosis, clubbing or edema noted bilaterally Neuro: Cranial nerves II-XII intact, no focal neurological deficits  Disposition: Home with brother and sister-in-law  Diet: No restrictions  Activity: Restrictions discussed with patient   Disposition and Follow-up:   Next week for staple removal  TESTS THAT NEED FOLLOW-UP Probable eventual medical oncology referral  DISCHARGE FOLLOW-UP Follow-up Information    Follow up with Chelsea Aus, MD. (as scheduled)    Contact information:   [address] [place] [address] [phone]          Time spent on Discharge: 10 mins.  Signed: Chelsea Aus 11/18/2011, 8:26 AM

## 2011-11-18 NOTE — Plan of Care (Signed)
Problem: Discharge Progression Outcomes Goal: Tubes and drains discontinued if indicated Outcome: Completed/Met Date Met:  11/18/11 D/c jp drain. Goal: Staples/sutures removed Outcome: Adequate for Discharge Md. Will d/c staples later in the office.

## 2011-11-25 ENCOUNTER — Telehealth: Payer: Self-pay | Admitting: Oncology

## 2011-11-25 NOTE — Telephone Encounter (Signed)
S/w both pt and sister in law kay re appt for 5/17 @ 10:30 am. Joyce Gross [phone])

## 2011-11-26 ENCOUNTER — Other Ambulatory Visit: Payer: Self-pay | Admitting: Oncology

## 2011-11-26 DIAGNOSIS — C649 Malignant neoplasm of unspecified kidney, except renal pelvis: Secondary | ICD-10-CM

## 2011-11-27 ENCOUNTER — Ambulatory Visit: Payer: Medicare Other

## 2011-11-27 ENCOUNTER — Other Ambulatory Visit (HOSPITAL_BASED_OUTPATIENT_CLINIC_OR_DEPARTMENT_OTHER): Payer: Medicare Other | Admitting: Lab

## 2011-11-27 ENCOUNTER — Ambulatory Visit (HOSPITAL_BASED_OUTPATIENT_CLINIC_OR_DEPARTMENT_OTHER): Payer: Medicare Other | Admitting: Oncology

## 2011-11-27 ENCOUNTER — Telehealth: Payer: Self-pay | Admitting: Oncology

## 2011-11-27 VITALS — BP 108/69 | HR 77 | Temp 97.1°F | Ht 72.0 in | Wt 209.6 lb

## 2011-11-27 DIAGNOSIS — C649 Malignant neoplasm of unspecified kidney, except renal pelvis: Secondary | ICD-10-CM

## 2011-11-27 DIAGNOSIS — Z8546 Personal history of malignant neoplasm of prostate: Secondary | ICD-10-CM

## 2011-11-27 DIAGNOSIS — C679 Malignant neoplasm of bladder, unspecified: Secondary | ICD-10-CM

## 2011-11-27 LAB — CBC WITH DIFFERENTIAL/PLATELET
Basophils Absolute: 0 10*3/uL (ref 0.0–0.1)
EOS%: 3.8 % (ref 0.0–7.0)
Eosinophils Absolute: 0.2 10*3/uL (ref 0.0–0.5)
HCT: 33.8 % — ABNORMAL LOW (ref 38.4–49.9)
HGB: 11.5 g/dL — ABNORMAL LOW (ref 13.0–17.1)
LYMPH%: 26.1 % (ref 14.0–49.0)
MCH: 28.5 pg (ref 27.2–33.4)
MCV: 84 fL (ref 79.3–98.0)
MONO%: 15.7 % — ABNORMAL HIGH (ref 0.0–14.0)
NEUT#: 3.1 10*3/uL (ref 1.5–6.5)
NEUT%: 53.8 % (ref 39.0–75.0)
Platelets: 269 10*3/uL (ref 140–400)
RDW: 15.8 % — ABNORMAL HIGH (ref 11.0–14.6)

## 2011-11-27 LAB — COMPREHENSIVE METABOLIC PANEL
Alkaline Phosphatase: 75 U/L (ref 39–117)
BUN: 13 mg/dL (ref 6–23)
CO2: 23 mEq/L (ref 19–32)
Glucose, Bld: 102 mg/dL — ABNORMAL HIGH (ref 70–99)
Sodium: 137 mEq/L (ref 135–145)
Total Bilirubin: 0.5 mg/dL (ref 0.3–1.2)
Total Protein: 6.6 g/dL (ref 6.0–8.3)

## 2011-11-27 LAB — LACTATE DEHYDROGENASE: LDH: 126 U/L (ref 94–250)

## 2011-11-27 NOTE — Progress Notes (Signed)
Note dictated

## 2011-11-27 NOTE — Telephone Encounter (Signed)
Del.11/27/11

## 2011-11-27 NOTE — Progress Notes (Signed)
CC:   Joe Park. Joe Park, M.D. Joe Park. Joe Park, M.D.  REASON FOR CONSULTATION:  Bladder cancer.  HISTORY OF PRESENT ILLNESS:  Joe Park is a pleasant gentleman with a history of hypertension and hypothyroidism.  He had a history of prostate cancer dates back to 2002.  At that time he received seed implants for therapy completed at that time.  The patient is followed by Dr. Larey Park from urology and for the really longest time did not have any evidence to suggest recurrent disease.  In December of 2012 he started developing gross hematuria and was evaluated initially by CT scan, showed a possible bladder stone and a diverticulum.  A repeat cystoscopy done in March of 2013 showed a solid sessile appearing lesion that encircles the bladder neck sparing only a portion of the trigone and extending about halfway back through into the bladder.  Based on that a TURBT was performed and the pathology from that tumor on 09/22/2011, case number [id number] showed a high-grade malignant single cell proliferation and associated with nuclear pleomorphism.  The tumor is composed of predominantly malignant spindle cells with the immunohistochemical stain showed a positive for pancytokeratin smooth muscle actin and c-KIT positive and negative PSA again consistent with sarcomatoid carcinoma.  The patient again based on that had imaging studies which showed no evidence to suggest metastatic disease.  It was felt he would be a good candidate for cystectomy.  On 11/11/2011 he underwent a cystoprostatectomy and was discharged after 7 days of hospitalization without any major complication.  The description of the procedure, he had a radical cystoprostatectomy, bilateral pelvic lymph node dissection and creation of an ileal conduit.  The pathology from that operation, case number [id number] showed the bladder had a high- grade malignancy consistent with sarcomatoid carcinoma measuring 7.5 cm. The  malignancy extends through the bladder wall into the perivesical soft tissue.  The malignancy identified at the black anterior surface of the bladder.  Four out of 4 lymph nodes in the right pelvis were negative and 6 out of 6 in the left pelvis were negative.  The pathological staging was T3B N0.  The patient recovered fairly well.  He presents today for evaluation regarding further treatment options.  He has not regained all of his activities of daily living but most of it. He is not driving still but he is able to eat better.  He is fairly mobile and continues to live independently.  He had not reported any abdominal pain.  He had not reported any back pain.  He is able to manage his urostomy without any major problems.  He had not had any constipation and had not had any major changes in his performance status or activity level.  REVIEW OF SYSTEMS:  Not reporting any headaches, blurry vision, double vision.  Not reporting any motor or sensory neuropathy.  Not reporting any alteration in mental status, not reporting any psychiatric issues or depression.  Not reporting any fever, chills, sweats.  Not reporting any cough, hemoptysis, hematemesis.  No nausea, vomiting, abdominal pain, hematochezia, melena, genitourinary complaints.  Rest of review of systems unremarkable.  PAST MEDICAL HISTORY:  Significant for hypertension, hypothyroidism, history of hyperlipidemia and history of prostate cancer.  Recent diagnosis of bladder cancer.  He is also status post inguinal hernia repair, umbilical hernia repair.  MEDICATIONS:  Reviewed today.  He is on simvastatin, multivitamin, Remeron, Prinivil, Synthroid, hydrochlorothiazide, Colace and aspirin.  ALLERGIES:  None.  SOCIAL HISTORY:  He is single.  Lives alone.  Accompanied by his brother today who lives close by.  He denied any alcohol or tobacco abuse.  Does not have any children.  He is retired from [employer].  He also served in the  [employer] during [place] war.  FAMILY HISTORY:  His father died of emphysema.  Mother died of heart problems.  No evidence of any malignancy or prostate cancer or any genitourinary cancer.  PHYSICAL EXAMINATION:  General:  Alert, awake gentleman, appeared in no active distress today.  Vital signs:  His blood pressure is 108/69, pulse 77, respirations 20, temp is 97.1.  ECOG performance status is 1. HEENT:  Head is normocephalic, atraumatic.  Pupils equal, round, reactive to light.  Oral mucosa moist and pink.  Neck:  Supple without adenopathy.  Heart:  Regular rate and rhythm.  S1, S2.  Lungs:  Clear to auscultation without rhonchi or wheeze.  No dullness to percussion. Abdomen:  Soft, nontender.  No hepatosplenomegaly.  Extremities:  No clubbing, cyanosis or edema.  Neurological:  Intact motor, sensory and deep tendon reflexes.  LABORATORY DATA:  Showed a hemoglobin of 11.5, white cell count 5.7, platelet count 269.  ASSESSMENT AND PLAN:  A 71 year old gentleman with the following issues: 1. Recent diagnosis of high-grade malignancy of the bladder     histologically identified as sarcomatoid carcinoma.  He had a 7.5     cm T3B N0 pathologically.  I had a discussion today with Mr.     Park, his brother and sister-in-law accompanied him today,     discussing the natural course of bladder cancer and more     specifically sarcomatoid variant. 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  The literature     describing this particular tumor is rather scant.  Nonetheless with     the case reports described in the literature, clearly the treatment     of choice in this particular setting has been already accomplished     with a total cystectomy.  However, I felt that given the aggressive     nature of this tumor,  he has a high risk of developing metastatic     disease, and adjuvant cisplatin therapy has been offered in  the     past.  I have discussed this today with Joe Park and his family.     I feel he is in reasonable health and shape.  He has good residual     renal function with a creatinine clearance of over 80 mL per     minute.  I think it is reasonable to consider that in him.  Before     we do so, I would like to restage him with a CT scan chest, abdomen     and pelvis as well as a bone scan to be accomplished in about 2-4     weeks, and as well as a bone scan given the propensity of his     cancer to spread.  If all of that is negative, then we can talk     about adjuvant cisplatin based chemotherapy.  I have discussed that     today with him briefly.  He is open to the idea.  I think he would     be a reasonable candidate, and again we will finalize the plan     after we restage him. 2. Positive margins.  Again I doubt that adjuvant radiation therapy     will offer much at this point, especially in the setting of his     previous radiation therapy.  I doubt his pelvis will handle any     more radiation at this time. 3. History of prostate cancer.  It seems to be under control without     any evidence to suggest recurrent relapsed disease.       ______________________________ Benjiman Core, M.D. FNS/MEDQ  D:  11/27/2011  T:  11/27/2011  Job:  [id number]

## 2011-11-27 NOTE — Telephone Encounter (Signed)
appts made and printed for pt aom °

## 2011-11-30 ENCOUNTER — Encounter (HOSPITAL_COMMUNITY): Payer: Self-pay | Admitting: Emergency Medicine

## 2011-11-30 ENCOUNTER — Other Ambulatory Visit: Payer: Self-pay

## 2011-11-30 ENCOUNTER — Inpatient Hospital Stay (HOSPITAL_COMMUNITY)
Admission: EM | Admit: 2011-11-30 | Discharge: 2011-12-01 | DRG: 690 | Disposition: A | Discharge status: Home Health | Payer: Medicare Other | Attending: Internal Medicine | Admitting: Internal Medicine

## 2011-11-30 ENCOUNTER — Emergency Department (HOSPITAL_COMMUNITY): Payer: Medicare Other

## 2011-11-30 DIAGNOSIS — C61 Malignant neoplasm of prostate: Secondary | ICD-10-CM | POA: Diagnosis present

## 2011-11-30 DIAGNOSIS — R42 Dizziness and giddiness: Secondary | ICD-10-CM

## 2011-11-30 DIAGNOSIS — C679 Malignant neoplasm of bladder, unspecified: Secondary | ICD-10-CM

## 2011-11-30 DIAGNOSIS — I959 Hypotension, unspecified: Secondary | ICD-10-CM | POA: Diagnosis present

## 2011-11-30 DIAGNOSIS — K219 Gastro-esophageal reflux disease without esophagitis: Secondary | ICD-10-CM | POA: Insufficient documentation

## 2011-11-30 DIAGNOSIS — Z936 Other artificial openings of urinary tract status: Secondary | ICD-10-CM

## 2011-11-30 DIAGNOSIS — E785 Hyperlipidemia, unspecified: Secondary | ICD-10-CM

## 2011-11-30 DIAGNOSIS — I10 Essential (primary) hypertension: Secondary | ICD-10-CM | POA: Diagnosis present

## 2011-11-30 DIAGNOSIS — Z9079 Acquired absence of other genital organ(s): Secondary | ICD-10-CM

## 2011-11-30 DIAGNOSIS — Z8546 Personal history of malignant neoplasm of prostate: Secondary | ICD-10-CM

## 2011-11-30 DIAGNOSIS — Z906 Acquired absence of other parts of urinary tract: Secondary | ICD-10-CM

## 2011-11-30 DIAGNOSIS — G4733 Obstructive sleep apnea (adult) (pediatric): Secondary | ICD-10-CM

## 2011-11-30 DIAGNOSIS — N390 Urinary tract infection, site not specified: Principal | ICD-10-CM

## 2011-11-30 DIAGNOSIS — Z8551 Personal history of malignant neoplasm of bladder: Secondary | ICD-10-CM

## 2011-11-30 DIAGNOSIS — Z932 Ileostomy status: Secondary | ICD-10-CM

## 2011-11-30 DIAGNOSIS — D649 Anemia, unspecified: Secondary | ICD-10-CM

## 2011-11-30 DIAGNOSIS — C7989 Secondary malignant neoplasm of other specified sites: Secondary | ICD-10-CM | POA: Insufficient documentation

## 2011-11-30 DIAGNOSIS — E039 Hypothyroidism, unspecified: Secondary | ICD-10-CM

## 2011-11-30 DIAGNOSIS — R509 Fever, unspecified: Secondary | ICD-10-CM

## 2011-11-30 LAB — URINALYSIS, ROUTINE W REFLEX MICROSCOPIC
Bilirubin Urine: NEGATIVE
Ketones, ur: NEGATIVE mg/dL
Nitrite: NEGATIVE
pH: 6 (ref 5.0–8.0)

## 2011-11-30 LAB — COMPREHENSIVE METABOLIC PANEL
AST: 24 U/L (ref 0–37)
Albumin: 2.9 g/dL — ABNORMAL LOW (ref 3.5–5.2)
BUN: 33 mg/dL — ABNORMAL HIGH (ref 6–23)
Calcium: 8.6 mg/dL (ref 8.4–10.5)
Creatinine, Ser: 1.88 mg/dL — ABNORMAL HIGH (ref 0.50–1.35)
GFR calc non Af Amer: 35 mL/min — ABNORMAL LOW (ref >90)

## 2011-11-30 LAB — CBC
HCT: 32.1 % — ABNORMAL LOW (ref 39.0–52.0)
MCH: 27.1 pg (ref 26.0–34.0)
MCHC: 33 g/dL (ref 30.0–36.0)
MCV: 82.1 fL (ref 78.0–100.0)
RDW: 14.8 % (ref 11.5–15.5)

## 2011-11-30 LAB — DIFFERENTIAL
Basophils Absolute: 0 10*3/uL (ref 0.0–0.1)
Basophils Relative: 0 % (ref 0–1)
Eosinophils Relative: 1 % (ref 0–5)
Monocytes Absolute: 0.9 10*3/uL (ref 0.1–1.0)
Monocytes Relative: 11 % (ref 3–12)

## 2011-11-30 LAB — APTT: aPTT: 37 seconds (ref 24–37)

## 2011-11-30 LAB — PROTIME-INR
INR: 1.14 (ref 0.00–1.49)
Prothrombin Time: 14.8 seconds (ref 11.6–15.2)

## 2011-11-30 LAB — URINE MICROSCOPIC-ADD ON

## 2011-11-30 LAB — TROPONIN I: Troponin I: <0.3 ng/mL (ref <0.30)

## 2011-11-30 LAB — TYPE AND SCREEN: Antibody Screen: NEGATIVE

## 2011-11-30 MED ORDER — ADULT MULTIVITAMIN W/MINERALS CH
1.0000 | ORAL_TABLET | Freq: Every day | ORAL | Status: DC
  Administered 2011-12-01: 1 via ORAL
  Filled 2011-11-30: qty 1

## 2011-11-30 MED ORDER — DOCUSATE SODIUM 100 MG PO CAPS
100.0000 mg | ORAL_CAPSULE | Freq: Two times a day (BID) | ORAL | Status: DC
  Administered 2011-12-01: 100 mg via ORAL
  Filled 2011-11-30 (×3): qty 1

## 2011-11-30 MED ORDER — SODIUM CHLORIDE 0.9 % IV SOLN
1000.0000 mL | INTRAVENOUS | Status: DC
  Administered 2011-11-30: 1000 mL via INTRAVENOUS

## 2011-11-30 MED ORDER — ENOXAPARIN SODIUM 40 MG/0.4ML ~~LOC~~ SOLN
40.0000 mg | Freq: Every day | SUBCUTANEOUS | Status: DC
  Administered 2011-12-01: 40 mg via SUBCUTANEOUS
  Filled 2011-11-30 (×2): qty 0.4

## 2011-11-30 MED ORDER — LEVOTHYROXINE SODIUM 125 MCG PO TABS
125.0000 ug | ORAL_TABLET | Freq: Every day | ORAL | Status: DC
  Administered 2011-12-01: 125 ug via ORAL
  Filled 2011-11-30 (×2): qty 1

## 2011-11-30 MED ORDER — SODIUM CHLORIDE 0.9 % IV BOLUS (SEPSIS)
500.0000 mL | Freq: Once | INTRAVENOUS | Status: AC
  Administered 2011-11-30: 500 mL via INTRAVENOUS

## 2011-11-30 MED ORDER — POTASSIUM CHLORIDE IN NACL 20-0.9 MEQ/L-% IV SOLN
INTRAVENOUS | Status: DC
  Administered 2011-12-01: 01:00:00 via INTRAVENOUS
  Filled 2011-11-30 (×2): qty 1000

## 2011-11-30 MED ORDER — ACETAMINOPHEN 650 MG RE SUPP: 650.0000 mg | Freq: Four times a day (QID) | RECTAL | Status: DC | PRN

## 2011-11-30 MED ORDER — ZOLPIDEM TARTRATE 5 MG PO TABS: 5.0000 mg | ORAL_TABLET | Freq: Every evening | ORAL | Status: DC | PRN

## 2011-11-30 MED ORDER — ASPIRIN 81 MG PO CHEW
81.0000 mg | CHEWABLE_TABLET | Freq: Every day | ORAL | Status: DC
  Administered 2011-12-01: 81 mg via ORAL
  Filled 2011-11-30 (×2): qty 1

## 2011-11-30 MED ORDER — MIRTAZAPINE 15 MG PO TABS
15.0000 mg | ORAL_TABLET | Freq: Every day | ORAL | Status: DC
  Filled 2011-11-30: qty 1

## 2011-11-30 MED ORDER — DEXTROSE 5 % IV SOLN
1.0000 g | INTRAVENOUS | Status: DC
  Administered 2011-11-30: 1 g via INTRAVENOUS
  Filled 2011-11-30 (×2): qty 10

## 2011-11-30 MED ORDER — ACETAMINOPHEN 325 MG PO TABS: 650.0000 mg | ORAL_TABLET | Freq: Four times a day (QID) | ORAL | Status: DC | PRN

## 2011-11-30 MED ORDER — SIMVASTATIN 40 MG PO TABS
40.0000 mg | ORAL_TABLET | Freq: Every day | ORAL | Status: DC
  Administered 2011-12-01: 40 mg via ORAL
  Filled 2011-11-30 (×2): qty 1

## 2011-11-30 MED ORDER — SODIUM CHLORIDE 0.9 % IV SOLN
1000.0000 mL | Freq: Once | INTRAVENOUS | Status: AC
  Administered 2011-11-30: 1000 mL via INTRAVENOUS

## 2011-11-30 NOTE — ED Notes (Signed)
Home  Health nurse told to come to hospital, has hypotension, orthostatic VS. Gets dizzy when standing up.

## 2011-11-30 NOTE — ED Notes (Signed)
Lives alone, but has family look after him.

## 2011-11-30 NOTE — H&P (Signed)
Joe Park is an 71 y.o. male.   Chief Complaint: Fever and dizziness HPI:  The patient is a 71 year old Caucasian man with several medical problems who on 11/11/2011 had complete cystectomy with lymph node dissection and ilial conduit construction. He did well with this and was discharged to home within a relatively short period of time. He has had a home health nurse. Yesterday he had a temperature of 102F. This morning he also had a temperature 100 and felt lightheaded. His home health nurse checked his blood pressure and noted that it was somewhat well, so she recommended going to the emergency room for evaluation. Lately he had not had problems with nasal congestion, cough, abdominal pain, nausea, vomiting, diarrhea, or constipation. The fluid in his ileal conduit was somewhat cloudy. He has not had pain around the surgical site. Initial workup in the emergency room showed too numerous to count white blood cells in his ileal conduit urine specimen. He did not have significant leukocytosis or fever in the emergency room. He showed no other focal signs of infection. His blood pressure was somewhat low, so he is being admitted for an initial IV fluids and IV antibiotics.  Past Medical History  Diagnosis Date  . Hypertension   . Elevated cholesterol   . GERD (gastroesophageal reflux disease)   . Bladder tumor   . Difficulty urinating   . Hypothyroidism     UNSURE IF TOO LOW OR TOO HIGH  . Difficulty sleeping   . Cancer     HX PROSTATE CANCER AND BLADDER CANCER  . Speech impediment     SINCE BIRTH  . Sleep apnea     STOP BANG SCORE 5     (Not in a hospital admission)  ADDITIONAL HOME MEDICATIONS: See pharmacy medication reconciliation list  PHYSICIANS INVOLVED IN CARE: Jarome Matin (primary care), [name] and Bjorn Pippin (urology)  Past Surgical History  Procedure Date  . Cataracts 2012    RT CATARACT REMOVED  . Radioactive seed implant 1990'S    PROSTATE CANCER   . Hernia repair 1980'S  . Transurethral resection of bladder tumor 09/22/2011    Procedure: TRANSURETHRAL RESECTION OF BLADDER TUMOR (TURBT);  Surgeon: Anner Crete, MD;  Location: [facility];  Service: Urology;  Laterality: N/A;  Examination Under Anesthesia/TUR-BT with Gyrus  . Cystectomy 11/11/2011    Procedure: CYSTECTOMY COMPLETE;  Surgeon: [name], MD;  Location: [facility];  Service: Urology;  Laterality: N/A;  RADICAL CYSTECTOMY WITH NODE DISSECTION AND ILEAL CONDUIT   . Lymph node dissection 11/11/2011    Procedure: LYMPH NODE DISSECTION;  Surgeon: [name], MD;  Location: [facility];  Service: Urology;  Laterality: N/A;    History reviewed. No pertinent family history.   Social History:  reports that he has never smoked. He has never used smokeless tobacco. He reports that he does not drink alcohol or use illicit drugs.  Allergies: No Known Allergies   ROS: cancer/tumor, high blood pressure and History of prostate cancer, history of bladder cancer  PHYSICAL EXAM: Blood pressure 100/52, pulse 74, temperature 98.3 F (36.8 C), temperature source Oral, resp. rate 17, weight 95.255 kg (210 lb), SpO2 99.00%.  in general, the patient is a well-nourished well-developed white man who was in no apparent distress while sitting partially upright in bed eating supper. HEENT exam was within normal limits, neck was supple without jugular venous distention or carotid bruit, chest was clear to auscultation, heart had a regular rate and rhythm without significant  murmur or gallop, abdomen had normal bowel sounds and no hepatosplenomegaly or tenderness. The lower abdominal surgical site is clean, dry, and intact with no signs of infection. The ileal conduit has a normal appearance with some cloudiness to the urine in the bag. Extremities were without cyanosis, clubbing, or edema. Neurologic exam was nonfocal.   Results for orders placed during the hospital encounter of 11/30/11 (from the past  48 hour(s))  CBC     Status: Abnormal   Collection Time   11/30/11  6:05 PM      Component Value Range Comment   WBC 7.6  4.0 - 10.5 (K/uL)    RBC 3.91 (*) 4.22 - 5.81 (MIL/uL)    Hemoglobin 10.6 (*) 13.0 - 17.0 (g/dL)    HCT 04.5 (*) 40.9 - 52.0 (%)    MCV 82.1  78.0 - 100.0 (fL)    MCH 27.1  26.0 - 34.0 (pg)    MCHC 33.0  30.0 - 36.0 (g/dL)    RDW 81.1  91.4 - 78.2 (%)    Platelets 268  150 - 400 (K/uL)   DIFFERENTIAL     Status: Normal   Collection Time   11/30/11  6:05 PM      Component Value Range Comment   Neutrophils Relative 68  43 - 77 (%)    Neutro Abs 5.2  1.7 - 7.7 (K/uL)    Lymphocytes Relative 20  12 - 46 (%)    Lymphs Abs 1.5  0.7 - 4.0 (K/uL)    Monocytes Relative 11  3 - 12 (%)    Monocytes Absolute 0.9  0.1 - 1.0 (K/uL)    Eosinophils Relative 1  0 - 5 (%)    Eosinophils Absolute 0.0  0.0 - 0.7 (K/uL)    Basophils Relative 0  0 - 1 (%)    Basophils Absolute 0.0  0.0 - 0.1 (K/uL)   COMPREHENSIVE METABOLIC PANEL     Status: Abnormal   Collection Time   11/30/11  6:05 PM      Component Value Range Comment   Sodium 130 (*) 135 - 145 (mEq/L)    Potassium 3.3 (*) 3.5 - 5.1 (mEq/L)    Chloride 96  96 - 112 (mEq/L)    CO2 21  19 - 32 (mEq/L)    Glucose, Bld 166 (*) 70 - 99 (mg/dL)    BUN 33 (*) 6 - 23 (mg/dL)    Creatinine, Ser 9.56 (*) 0.50 - 1.35 (mg/dL)    Calcium 8.6  8.4 - 10.5 (mg/dL)    Total Protein 7.5  6.0 - 8.3 (g/dL)    Albumin 2.9 (*) 3.5 - 5.2 (g/dL)    AST 24  0 - 37 (U/L)    ALT 17  0 - 53 (U/L)    Alkaline Phosphatase 84  39 - 117 (U/L)    Total Bilirubin 0.8  0.3 - 1.2 (mg/dL)    GFR calc non Af Amer 35 (*) >90 (mL/min)    GFR calc Af Amer 40 (*) >90 (mL/min)   PROTIME-INR     Status: Normal   Collection Time   11/30/11  6:05 PM      Component Value Range Comment   Prothrombin Time 14.8  11.6 - 15.2 (seconds)    INR 1.14  0.00 - 1.49    APTT     Status: Normal   Collection Time   11/30/11  6:05 PM      Component Value Range Comment  aPTT 37  24 - 37 (seconds)   TYPE AND SCREEN     Status: Normal   Collection Time   11/30/11  6:05 PM      Component Value Range Comment   ABO/RH(D) O POS      Antibody Screen NEG      Sample Expiration 12/03/2011     LACTIC ACID, PLASMA     Status: Normal   Collection Time   11/30/11  6:05 PM      Component Value Range Comment   Lactic Acid, Venous 1.5  0.5 - 2.2 (mmol/L)   TROPONIN I     Status: Normal   Collection Time   11/30/11  6:05 PM      Component Value Range Comment   Troponin I <0.30  <0.30 (ng/mL)   URINALYSIS, ROUTINE W REFLEX MICROSCOPIC     Status: Abnormal   Collection Time   11/30/11  6:17 PM      Component Value Range Comment   Color, Urine AMBER (*) YELLOW  BIOCHEMICALS MAY BE AFFECTED BY COLOR   APPearance TURBID (*) CLEAR     Specific Gravity, Urine 1.015  1.005 - 1.030     pH 6.0  5.0 - 8.0     Glucose, UA NEGATIVE  NEGATIVE (mg/dL)    Hgb urine dipstick MODERATE (*) NEGATIVE     Bilirubin Urine NEGATIVE  NEGATIVE     Ketones, ur NEGATIVE  NEGATIVE (mg/dL)    Protein, ur 409 (*) NEGATIVE (mg/dL)    Urobilinogen, UA 0.2  0.0 - 1.0 (mg/dL)    Nitrite NEGATIVE  NEGATIVE     Leukocytes, UA LARGE (*) NEGATIVE    URINE MICROSCOPIC-ADD ON     Status: Normal   Collection Time   11/30/11  6:17 PM      Component Value Range Comment   WBC, UA TOO NUMEROUS TO COUNT  <3 (WBC/hpf)    RBC / HPF 7-10  <3 (RBC/hpf)    Dg Chest Portable 1 View  11/30/2011  *RADIOLOGY REPORT*  Clinical Data: Hypotension.  PORTABLE CHEST - 1 VIEW 11/30/2011 1802 hours:  Comparison: Two-view chest x-ray 09/17/2011.  Findings: Cardiac silhouette normal and mediastinal contours unremarkable for the AP portable technique.  Lungs clear. Pulmonary vascularity normal.  Bronchovascular markings normal.  No pneumothorax.  No pleural effusions.  IMPRESSION: No acute cardiopulmonary disease.  Original Report Authenticated By: Arnell Sieving, M.D.   Point of contact is Leib Elahi ([facility]) at  [phone]  Assessment/Plan #1 Fever: Most likely from a urinary tract infection given the findings on urinalysis. He had recently been treated with ciprofloxacin, so we will use Rocephin IV pending urine culture results. #2 Hypotension:   blood pressure is somewhat low today with systolic blood pressure less than 100. This most likely from fever in the face of ongoing medication for hypertension. We will treat him with IV fluids and hold his ACE inhibitor and diuretic. We will check orthostatic vital signs in the morning.   Militza Devery G 11/30/2011, 10:23 PM

## 2011-11-30 NOTE — ED Notes (Signed)
Joe Park, family member,  Tel# [phone]

## 2011-11-30 NOTE — ED Provider Notes (Signed)
History     CSN: [id number]  Arrival date & time 11/30/11  1702   First MD Initiated Contact with Patient 11/30/11 1739      Chief Complaint  Patient presents with  . Hypotension    home health nurse took BP     HPI Pt had surgery on May 1st for bladder CA.  Pt developed a fever yesterday.  The temp was 102.  No cough, no vomiting or diarrhea.  Pt has a urostomy and has noticed some redness around him.  Pt had the home health nurse check on him today and noticed that his blood pressure was low.  Pt did take his blood pressure medications this am.  Pt feels fine lying down but gets lightheaded when he stands up. Patient has not noticed any drainage around the wound. He has not any trouble with any chest pain, shortness of breath or rectal bleeding.    Past Medical History  Diagnosis Date  . Hypertension   . Elevated cholesterol   . GERD (gastroesophageal reflux disease)   . Bladder tumor   . Difficulty urinating   . Hypothyroidism     UNSURE IF TOO LOW OR TOO HIGH  . Difficulty sleeping   . Cancer     HX PROSTATE CANCER AND BLADDER CANCER  . Speech impediment     SINCE BIRTH  . Sleep apnea     STOP BANG SCORE 5    Past Surgical History  Procedure Date  . Cataracts 2012    RT CATARACT REMOVED  . Radioactive seed implant 1990'S    PROSTATE CANCER  . Hernia repair 1980'S  . Transurethral resection of bladder tumor 09/22/2011    Procedure: TRANSURETHRAL RESECTION OF BLADDER TUMOR (TURBT);  Surgeon: Anner Crete, MD;  Location: [facility];  Service: Urology;  Laterality: N/A;  Examination Under Anesthesia/TUR-BT with [place]  . Cystectomy 11/11/2011    Procedure: CYSTECTOMY COMPLETE;  Surgeon: [name], MD;  Location: [facility];  Service: Urology;  Laterality: N/A;  RADICAL CYSTECTOMY WITH NODE DISSECTION AND ILEAL CONDUIT   . Lymph node dissection 11/11/2011    Procedure: LYMPH NODE DISSECTION;  Surgeon: [name], MD;  Location: [facility];  Service: Urology;  Laterality:  N/A;    History reviewed. No pertinent family history.  History  Substance Use Topics  . Smoking status: Never Smoker   . Smokeless tobacco: Never Used  . Alcohol Use: No      Review of Systems  Constitutional: Positive for fever.  Respiratory: Positive for cough.   Gastrointestinal: Negative for abdominal pain, diarrhea and blood in stool.  Skin: Negative for pallor.  Neurological: Negative for seizures.  Psychiatric/Behavioral: Negative for confusion.  All other systems reviewed and are negative.    Allergies  Review of patient's allergies indicates no known allergies.  Home Medications   Current Outpatient Rx  Name Route Sig Dispense Refill  . ASPIRIN 81 MG PO CHEW Oral Chew 81 mg by mouth daily with breakfast.     . CIPROFLOXACIN HCL 500 MG PO TABS Oral Take 500 mg by mouth 2 (two) times daily.    Marland Kitchen DOCUSATE SODIUM 100 MG PO CAPS Oral Take 100 mg by mouth 2 (two) times daily.    Marland Kitchen HYDROCHLOROTHIAZIDE 25 MG PO TABS Oral Take 12.5 mg by mouth every morning.    Marland Kitchen LEVOTHYROXINE SODIUM 125 MCG PO TABS Oral Take 125 mcg by mouth daily with breakfast.     . LISINOPRIL 40 MG PO  TABS Oral Take 40 mg by mouth every morning.    Marland Kitchen MIRTAZAPINE 15 MG PO TABS Oral Take 15 mg by mouth at bedtime.    . ADULT MULTIVITAMIN W/MINERALS CH Oral Take 1 tablet by mouth every morning.    Marland Kitchen SIMVASTATIN 40 MG PO TABS Oral Take 40 mg by mouth at bedtime.      BP 97/63  Pulse 81  Temp(Src) 97.9 F (36.6 C) (Oral)  Resp 20  Wt 210 lb (95.255 kg)  SpO2 96%  Physical Exam  Nursing note and vitals reviewed. Constitutional: He appears well-developed and well-nourished. No distress.       Orthostatic with standing  HENT:  Head: Normocephalic and atraumatic.  Right Ear: External ear normal.  Left Ear: External ear normal.  Mouth/Throat: No oropharyngeal exudate (mm dry).  Eyes: Conjunctivae are normal. Right eye exhibits no discharge. Left eye exhibits no discharge. No scleral icterus.    Neck: Neck supple. No tracheal deviation present.  Cardiovascular: Normal rate, regular rhythm and intact distal pulses.   Pulmonary/Chest: Effort normal and breath sounds normal. No stridor. No respiratory distress. He has no wheezes. He has no rales.  Abdominal: Soft. Bowel sounds are normal. He exhibits no distension. There is no tenderness. There is no rebound and no guarding.  Musculoskeletal: He exhibits no edema and no tenderness.  Neurological: He is alert. He has normal strength. No sensory deficit. Cranial nerve deficit:  no gross defecits noted. He exhibits normal muscle tone. He displays no seizure activity. Coordination normal.  Skin: Skin is warm and dry. No rash noted.  Psychiatric: He has a normal mood and affect.    ED Course  Procedures (including critical care time)  Rate: 82  Rhythm: normal sinus rhythm  QRS Axis: normal  Intervals: normal  ST/T Wave abnormalities: Borderline T-wave abnormalities  Conduction Disutrbances:none  Narrative Interpretation: Borderline T-wave abnormalities, anterior leads; consider anterior infarct  Old EKG Reviewed: No significant changes  Labs Reviewed  CBC - Abnormal; Notable for the following:    RBC 3.91 (*)    Hemoglobin 10.6 (*)    HCT 32.1 (*)    All other components within normal limits  COMPREHENSIVE METABOLIC PANEL - Abnormal; Notable for the following:    Sodium 130 (*)    Potassium 3.3 (*)    Glucose, Bld 166 (*)    BUN 33 (*)    Creatinine, Ser 1.88 (*)    Albumin 2.9 (*)    GFR calc non Af Amer 35 (*)    GFR calc Af Amer 40 (*)    All other components within normal limits  URINALYSIS, ROUTINE W REFLEX MICROSCOPIC - Abnormal; Notable for the following:    Color, Urine AMBER (*) BIOCHEMICALS MAY BE AFFECTED BY COLOR   APPearance TURBID (*)    Hgb urine dipstick MODERATE (*)    Protein, ur 100 (*)    Leukocytes, UA LARGE (*)    All other components within normal limits  DIFFERENTIAL  PROTIME-INR  APTT  TYPE  AND SCREEN  LACTIC ACID, PLASMA  TROPONIN I  URINE MICROSCOPIC-ADD ON  URINE CULTURE   Dg Chest Portable 1 View  11/30/2011  *RADIOLOGY REPORT*  Clinical Data: Hypotension.  PORTABLE CHEST - 1 VIEW 11/30/2011 1802 hours:  Comparison: Two-view chest x-ray 09/17/2011.  Findings: Cardiac silhouette normal and mediastinal contours unremarkable for the AP portable technique.  Lungs clear. Pulmonary vascularity normal.  Bronchovascular markings normal.  No pneumothorax.  No pleural effusions.  IMPRESSION:  No acute cardiopulmonary disease.  Original Report Authenticated By: Arnell Sieving, M.D.     1. Hypotension   2. Urinary tract infection       MDM  Patient had an episode of hypotension earlier. He is noted to be orthostatic here in the emergency department. IV fluid has been ordered. Patient does not appear to be in any distress. I doubt acute cardiac etiology, there does not appear to be evidence of sepsis. His urinalysis is abnormal however this is difficult to interpret in the setting of his ileal conduit. I will culture his urine and I ordered a dose of Rocephin. Patient be admitted to the hospital for observation.       Celene Kras, MD 11/30/11 [phone]

## 2011-12-01 DIAGNOSIS — D649 Anemia, unspecified: Secondary | ICD-10-CM | POA: Diagnosis present

## 2011-12-01 LAB — COMPREHENSIVE METABOLIC PANEL
ALT: 31 U/L (ref 0–53)
AST: 47 U/L — ABNORMAL HIGH (ref 0–37)
Alkaline Phosphatase: 75 U/L (ref 39–117)
CO2: 21 mEq/L (ref 19–32)
Calcium: 8.2 mg/dL — ABNORMAL LOW (ref 8.4–10.5)
GFR calc Af Amer: 57 mL/min — ABNORMAL LOW (ref >90)
GFR calc non Af Amer: 49 mL/min — ABNORMAL LOW (ref >90)
Glucose, Bld: 109 mg/dL — ABNORMAL HIGH (ref 70–99)
Potassium: 3.8 mEq/L (ref 3.5–5.1)
Sodium: 134 mEq/L — ABNORMAL LOW (ref 135–145)

## 2011-12-01 LAB — CBC
HCT: 28.7 % — ABNORMAL LOW (ref 39.0–52.0)
Hemoglobin: 9.4 g/dL — ABNORMAL LOW (ref 13.0–17.0)
MCH: 27.2 pg (ref 26.0–34.0)
MCHC: 32.8 g/dL (ref 30.0–36.0)
RDW: 15 % (ref 11.5–15.5)

## 2011-12-01 MED ORDER — CEFDINIR 250 MG/5ML PO SUSR: 250.0000 mg | Freq: Two times a day (BID) | ORAL | Status: AC

## 2011-12-01 MED ORDER — ACETAMINOPHEN 325 MG PO TABS: 650.0000 mg | ORAL_TABLET | Freq: Four times a day (QID) | ORAL | Status: AC | PRN

## 2011-12-01 NOTE — Discharge Summary (Signed)
Physician Discharge Summary  Patient ID: Joe Park MRN: [id number] DOB/AGE: 1941/03/16 71 y.o.  Admit date: 11/30/2011 Discharge date: 12/01/2011   Discharge Diagnoses:  Principal Problem:  *Urinary tract infection Active Problems:  Fever  Dizziness - light-headed  Hypotension  Status post ileal conduit  Anemia  Obstructive sleep apnea   Discharged Condition: good  Hospital Course: The patient is a 71 year old man with several medical problems who on 11/11/2011 had complete cystectomy with lymph node dissection and ileal conduit construction. He did well with this and was discharged to home within a relatively short period of time. He had a home health nurse supervising care of the new ostomy device. On the day prior to hospital admission he had temperature of 10 45F. On the morning of admission he had a temperature 100 and felt lightheaded. His home health nurse checked his blood pressure and noted that it was somewhat low, so she recommended going to the emergency room for evaluation. At the time of emergency room evaluation he felt fine, other than having some lightheadedness and fatigue. He had not been having problems with nasal congestion, sinus pain, cough, shortness of breath, chest pain, nausea, vomiting, abdominal pain, diarrhea. His workup in the emergency room did show significant hypotension with urine from his ostomy having too numerous to count white blood cells, so he was admitted for IV fluids and initiation of IV antibiotic treatment for probable urinary tract infection.   Today, he feels much better, although his ostomy bag broke overnight. He is not having problems with shortness of breath, cough, or continued fever. His appetite is good. My plan is to have his ostomy bag replaced and have him discharged to home on by mouth antibiotics with followup teaching by his ostomy nurse.  Consults: None  Significant Diagnostic Studies:  Dg Chest Portable 1  View  11/30/2011  *RADIOLOGY REPORT*  Clinical Data: Hypotension.  PORTABLE CHEST - 1 VIEW 11/30/2011 1802 hours:  Comparison: Two-view chest x-ray 09/17/2011.  Findings: Cardiac silhouette normal and mediastinal contours unremarkable for the AP portable technique.  Lungs clear. Pulmonary vascularity normal.  Bronchovascular markings normal.  No pneumothorax.  No pleural effusions.  IMPRESSION: No acute cardiopulmonary disease.  Original Report Authenticated By: Arnell Sieving, M.D.    Labs: Lab Results  Component Value Date   WBC 6.3 12/01/2011   HGB 9.4* 12/01/2011   HCT 28.7* 12/01/2011   MCV 83.2 12/01/2011   PLT 209 12/01/2011     Lab 12/01/11 0453  NA 134*  K 3.8  CL 102  CO2 21  BUN 25*  CREATININE 1.40*  CALCIUM 8.2*  PROT 6.5  BILITOT 0.3  ALKPHOS 75  ALT 31  AST 47*  GLUCOSE 109*       Lab Results  Component Value Date   INR 1.14 11/30/2011   INR 0.98 11/04/2011     No results found for this or any previous visit (from the past 240 hour(s)).    Discharge Exam: Blood pressure 126/78, pulse 90, temperature 98.1 F (36.7 C), temperature source Oral, resp. rate 20, height 6' (1.829 m), weight 94.847 kg (209 lb 1.6 oz), SpO2 100.00%.  Physical Exam: In general, he is a well-nourished well-developed white man who was in no apparent distress. HEENT exam was within normal limits, chest was clear to auscultation, heart had a regular rate and rhythm without significant murmur or gallop, abdomen had normal bowel sounds no tenderness, extremities were without cyanosis, clubbing, or edema. Neurological exam was  nonfocal. His abdominal wound showed no signs of infection.  Disposition: He will be discharged home from the hospital for continued antibiotic treatment with a cephalosporin. In the hospital he was treated with Rocephin and he will continue outpatient treatment with Cefdinir. He was advised to give our office a call within 24 hours except a followup visit within one  week. We will request home health nursing to continue teaching regarding ostomy care.  Discharge Orders    Future Appointments: Provider: Department: Dept Phone: Center:   12/23/2011 9:00 AM [facility] 1 [facility] [phone] [facility]   12/23/2011 9:30 AM [facility]Ct 2 [facility] Imaging [phone] [place]   12/23/2011 12:00 PM [facility] 1 [facility] [phone] [place]   12/25/2011 3:00 PM Krista Blue [facility] [phone] None   12/25/2011 3:30 PM Benjiman Core, MD [facility] [phone] None     Future Orders Please Complete By Expires   Ambulatory referral to Home Health      Comments:   Please evaluate Valentina Lucks for admission to [facility].  Disciplines requested: Nursing  Services to provide: [facility]  Physician to follow patient's care (the person listed here will be responsible for signing ongoing orders): PCP  Requested Start of Care Date: Tomorrow  Special Instructions:  We will continue home health nurse teaching regarding care of new urinary ostomy bag   Diet - low sodium heart healthy      Increase activity slowly      Call MD for:  temperature >100.4        Medication List  As of 12/01/2011  8:52 AM   STOP taking these medications         ciprofloxacin 500 MG tablet      hydrochlorothiazide 25 MG tablet         TAKE these medications         acetaminophen 325 MG tablet   Commonly known as: TYLENOL   Take 2 tablets (650 mg total) by mouth every 6 (six) hours as needed (or Fever >/= 101).      aspirin 81 MG chewable tablet   Chew 81 mg by mouth daily with breakfast.      cefdinir 250 MG/5ML suspension   Commonly known as: OMNICEF   Take 5 mLs (250 mg total) by mouth 2 (two) times daily.      docusate sodium 100 MG capsule   Commonly known as: COLACE   Take 100 mg by mouth 2 (two) times daily.      levothyroxine 125 MCG tablet   Commonly known as: SYNTHROID, LEVOTHROID   Take 125 mcg by mouth daily with breakfast.       lisinopril 40 MG tablet   Commonly known as: PRINIVIL,ZESTRIL   Take 40 mg by mouth every morning.      mirtazapine 15 MG tablet   Commonly known as: REMERON   Take 15 mg by mouth at bedtime.      mulitivitamin with minerals Tabs   Take 1 tablet by mouth every morning.      simvastatin 40 MG tablet   Commonly known as: ZOCOR   Take 40 mg by mouth at bedtime.           Follow-up Information    Follow up with Garlan Fillers, MD. Call in 1 day. (call within 24 hours to set up followup appointment within the next 7 days)    Contact information:   [address] [facility] [place] [address] [phone]          Signed: Garlan Fillers 12/01/2011, 8:52 AM

## 2011-12-03 LAB — URINE CULTURE: Culture  Setup Time: 201305210147

## 2011-12-23 ENCOUNTER — Encounter (HOSPITAL_COMMUNITY)
Admission: RE | Admit: 2011-12-23 | Discharge: 2011-12-23 | Disposition: A | Discharge status: Home / Self Care | Payer: Medicare Other | Source: Ambulatory Visit | Attending: Oncology | Admitting: Oncology

## 2011-12-23 DIAGNOSIS — Z906 Acquired absence of other parts of urinary tract: Secondary | ICD-10-CM | POA: Insufficient documentation

## 2011-12-23 DIAGNOSIS — C679 Malignant neoplasm of bladder, unspecified: Secondary | ICD-10-CM

## 2011-12-23 DIAGNOSIS — N281 Cyst of kidney, acquired: Secondary | ICD-10-CM | POA: Insufficient documentation

## 2011-12-23 DIAGNOSIS — K4090 Unilateral inguinal hernia, without obstruction or gangrene, not specified as recurrent: Secondary | ICD-10-CM | POA: Insufficient documentation

## 2011-12-23 DIAGNOSIS — C61 Malignant neoplasm of prostate: Secondary | ICD-10-CM | POA: Insufficient documentation

## 2011-12-23 DIAGNOSIS — M4120 Other idiopathic scoliosis, site unspecified: Secondary | ICD-10-CM | POA: Insufficient documentation

## 2011-12-23 DIAGNOSIS — Z938 Other artificial opening status: Secondary | ICD-10-CM | POA: Insufficient documentation

## 2011-12-23 MED ORDER — TECHNETIUM TC 99M MEDRONATE IV KIT
25.0000 | PACK | Freq: Once | INTRAVENOUS | Status: AC | PRN
  Administered 2011-12-23: 25 via INTRAVENOUS

## 2011-12-23 MED ORDER — IOHEXOL 300 MG/ML  SOLN
100.0000 mL | Freq: Once | INTRAMUSCULAR | Status: AC | PRN
  Administered 2011-12-23: 100 mL via INTRAVENOUS

## 2011-12-25 ENCOUNTER — Ambulatory Visit (HOSPITAL_BASED_OUTPATIENT_CLINIC_OR_DEPARTMENT_OTHER): Payer: Medicare Other | Admitting: Oncology

## 2011-12-25 ENCOUNTER — Telehealth: Payer: Self-pay | Admitting: Oncology

## 2011-12-25 ENCOUNTER — Other Ambulatory Visit (HOSPITAL_BASED_OUTPATIENT_CLINIC_OR_DEPARTMENT_OTHER): Payer: Medicare Other | Admitting: Lab

## 2011-12-25 VITALS — BP 120/72 | HR 87 | Temp 97.2°F | Ht 72.0 in | Wt 206.0 lb

## 2011-12-25 DIAGNOSIS — C679 Malignant neoplasm of bladder, unspecified: Secondary | ICD-10-CM

## 2011-12-25 DIAGNOSIS — Z8546 Personal history of malignant neoplasm of prostate: Secondary | ICD-10-CM

## 2011-12-25 LAB — COMPREHENSIVE METABOLIC PANEL
ALT: 18 U/L (ref 0–53)
AST: 20 U/L (ref 0–37)
Albumin: 3.4 g/dL — ABNORMAL LOW (ref 3.5–5.2)
Alkaline Phosphatase: 92 U/L (ref 39–117)
BUN: 19 mg/dL (ref 6–23)
CO2: 21 mEq/L (ref 19–32)
Calcium: 9.2 mg/dL (ref 8.4–10.5)
Chloride: 105 mEq/L (ref 96–112)
Creatinine, Ser: 0.98 mg/dL (ref 0.50–1.35)
Glucose, Bld: 124 mg/dL — ABNORMAL HIGH (ref 70–99)
Potassium: 3.8 mEq/L (ref 3.5–5.3)
Sodium: 137 mEq/L (ref 135–145)
Total Bilirubin: 0.4 mg/dL (ref 0.3–1.2)
Total Protein: 7.5 g/dL (ref 6.0–8.3)

## 2011-12-25 LAB — CBC WITH DIFFERENTIAL/PLATELET
BASO%: 0.5 % (ref 0.0–2.0)
Basophils Absolute: 0 10*3/uL (ref 0.0–0.1)
EOS%: 1 % (ref 0.0–7.0)
Eosinophils Absolute: 0.1 10*3/uL (ref 0.0–0.5)
HCT: 35.1 % — ABNORMAL LOW (ref 38.4–49.9)
HGB: 11.6 g/dL — ABNORMAL LOW (ref 13.0–17.1)
LYMPH%: 33 % (ref 14.0–49.0)
MCH: 26.4 pg — ABNORMAL LOW (ref 27.2–33.4)
MCHC: 33 g/dL (ref 32.0–36.0)
MCV: 80.2 fL (ref 79.3–98.0)
MONO#: 0.7 10*3/uL (ref 0.1–0.9)
MONO%: 12.2 % (ref 0.0–14.0)
NEUT#: 3.2 10*3/uL (ref 1.5–6.5)
NEUT%: 53.3 % (ref 39.0–75.0)
Platelets: 177 10*3/uL (ref 140–400)
RBC: 4.38 10*6/uL (ref 4.20–5.82)
RDW: 16.4 % — ABNORMAL HIGH (ref 11.0–14.6)
WBC: 6 10*3/uL (ref 4.0–10.3)
lymph#: 2 10*3/uL (ref 0.9–3.3)

## 2011-12-25 NOTE — Telephone Encounter (Signed)
Gv pt appt for aug2013 °

## 2011-12-25 NOTE — Progress Notes (Signed)
Hematology and Oncology Follow Up Visit  Joe Park [id number] [birth year] 71 y.o. 12/25/2011 3:56 PM    Principle Diagnosis: 71 year old with bladder cancer diagnosed in 09/2010.    Prior Therapy: he underwent a cystoprostatectomy and bilateral pelvic lymph node dissection and creation of an ileal conduit done on 11/11/2011. 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  The malignancy extends through the bladder wall into the perivesical soft tissue. The malignancy identified at the black anterior surface of he bladder. Four out of 4 lymph nodes in the right pelvis were negative and 6 out of 6 in the left pelvis were negative. The pathological staging was T3B N0.  Secondary Diagnosis: Prostate cancer diagnosed in 2002. No relapse at this time.   Current therapy: Observations and Surveillance.   Interim History:  [name] returns today for a follow up visit. He is a gentleman I saw on 11/27/2011 for evaluation of bladder cancer. As mentioned, he underwent cystoprostatectomy on 11/11/2011 with pathology revealing high-grade malignancy with sarcomatoid features. Patient is recovering very well however he was hospitalized briefly on 11/30/2011 for urinary tract infection, dizziness and hypotension. Since his discharge, he had been doing very well. He had not reported any chest pain or shortness of breath had not reported any difficulty breathing had not reported any abdominal pain. He is doing well with his urostomy back. Is not reporting any other complaints at the time being he have regained most activities of daily living including excellent appetite and weight gain.  Medications: I have reviewed the patient's current medications. Current outpatient prescriptions:acetaminophen (TYLENOL) 325 MG tablet, Take 2 tablets (650 mg total) by mouth every 6 (six) hours as needed (or  Fever >/= 101)., Disp: 30 tablet, Rfl: 1;  aspirin 81 MG chewable tablet, Chew 81 mg by mouth daily with breakfast. , Disp: , Rfl: ;  docusate sodium (COLACE) 100 MG capsule, Take 100 mg by mouth 2 (two) times daily., Disp: , Rfl:  levothyroxine (SYNTHROID, LEVOTHROID) 125 MCG tablet, Take 125 mcg by mouth daily with breakfast. , Disp: , Rfl: ;  lisinopril (PRINIVIL,ZESTRIL) 40 MG tablet, Take 40 mg by mouth every morning., Disp: , Rfl: ;  mirtazapine (REMERON) 15 MG tablet, Take 15 mg by mouth at bedtime., Disp: , Rfl: ;  Multiple Vitamin (MULITIVITAMIN WITH MINERALS) TABS, Take 1 tablet by mouth every morning., Disp: , Rfl:  simvastatin (ZOCOR) 40 MG tablet, Take 40 mg by mouth at bedtime., Disp: , Rfl: ;  DISCONTD: hydrALAZINE (APRESOLINE) 25 MG tablet, Take 12.5 mg by mouth every morning., Disp: , Rfl:   Allergies: No Known Allergies  Past Medical History, Surgical history, Social history, and Family History were reviewed and updated.  Review of Systems: Constitutional:  Negative for fever, chills, night sweats, anorexia, weight loss, pain. Cardiovascular: no chest pain or dyspnea on exertion Respiratory: negative Neurological: negative Dermatological: negative ENT: negative Skin: Negative. Gastrointestinal: negative Genito-Urinary: negative Hematological and Lymphatic: negative Breast: negative Musculoskeletal: negative Remaining ROS negative. Physical Exam: Blood pressure 120/72, pulse 87, temperature 97.2 F (36.2 C), temperature source Oral, height 6' (1.829 m), weight 206 lb (93.441 kg). ECOG: 1 General appearance: alert Head: Normocephalic, without obvious abnormality, atraumatic Neck: no adenopathy, no carotid bruit, no JVD, supple, symmetrical, trachea midline and thyroid not enlarged, symmetric, no tenderness/mass/nodules Lymph nodes: Cervical, supraclavicular, and axillary nodes normal. Heart:regular rate and rhythm, S1, S2 normal, no  murmur, click, rub or  gallop Lung:chest clear, no wheezing, rales, normal symmetric air entry Abdomin: soft, non-tender, without masses or organomegaly EXT:no erythema, induration, or nodules   Lab Results: Lab Results  Component Value Date   WBC 6.0 12/25/2011   HGB 11.6* 12/25/2011   HCT 35.1* 12/25/2011   MCV 80.2 12/25/2011   PLT 177 12/25/2011     Chemistry      Component Value Date/Time   NA 134* 12/01/2011 0453   K 3.8 12/01/2011 0453   CL 102 12/01/2011 0453   CO2 21 12/01/2011 0453   BUN 25* 12/01/2011 0453   CREATININE 1.40* 12/01/2011 0453      Component Value Date/Time   CALCIUM 8.2* 12/01/2011 0453   ALKPHOS 75 12/01/2011 0453   AST 47* 12/01/2011 0453   ALT 31 12/01/2011 0453   BILITOT 0.3 12/01/2011 0453     CT CHEST, ABDOMEN AND PELVIS WITH CONTRAST 12/23/2011 Technique: Multidetector CT imaging of the chest, abdomen and  pelvis was performed following the standard protocol during bolus  administration of intravenous contrast.  Contrast: OMNIPAQUE IOHEXOL 300 MG/ML SOLN  Comparison: CT to 5013, 82,011  CT CHEST  Findings: No axillary or supraclavicular lymphadenopathy. No  mediastinal or hilar lymphadenopathy. No pericardial fluid. Small  6 mm lymph node left of the trachea above the aortic arch (image  15). There is some fluid in the mid esophagus. Review of the lung  parenchyma demonstrates a small subpleural nodule along the left  oblique fissure measuring 4 mm (image 32). There is a thin-walled  cystic lesion at the left medial apex which may represent  paraseptal emphysema.  IMPRESSION:  1. No evidence of thoracic metastasis.  2. Small subpleural lymph node along the left oblique fissure is  likely benign.  3. Small paratracheal lymph node noted. Recommend attention on  follow-up.  CT ABDOMEN AND PELVIS  Findings: No focal hepatic lesion. The gallbladder, pancreas,  spleen, adrenal glands are normal. No enhancing lesions within the  kidney. A simple nonenhancing cyst in  the anterior right kidney  measuring 10 mm. No hydronephrosis. Ileal conduit with urostomy  in the right abdominal wall.  The stomach, small bowel, colon are unremarkable.  Abdominal aorta normal caliber. No retroperitoneal periportal  lymphadenopathy. No peritoneal disease.  In the pelvis, there are brachytherapy seeds in the prostate bed.  Patient status post cystectomy. No pelvic lymphadenopathy.  Bilateral fat filled inguinal hernias are present. Review bone  windows demonstrates no aggressive osseous lesions. No change from  prior.  IMPRESSION:  1. Cystectomy and ureteral diversion for bladder cancer. No  evidence of local recurrence or metastasis in the abdomen or  pelvis.  2. Simple right renal cyst.  NUCLEAR MEDICINE WHOLE BODY BONE SCINTIGRAPHY  Technique: Whole body anterior and posterior images were obtained  approximately 3 hours after intravenous injection of  radiopharmaceutical.  Radiopharmaceutical: CURIE TC-MDP TECHNETIUM TC 15M  MEDRONATE IV KIT  Comparison: Abdominal/pelvic CT 12/23/2011  Findings: There is mild S-shaped scoliosis of the thoracolumbar  spine. There is urine contamination along the anterior pelvis  related to the cystectomy and ileal conduit. There is increased  uptake along the right side of the L2 vertebral body related to the  known compression fracture. Small amount of uptake in the right  knee is likely degenerative in etiology. No suspicious rib  lesions. There is uptake above the sternum which may be within the  thyroid tissue. Slightly increased uptake along the inferior right  SI joint, which is nonspecific.  IMPRESSION:  No evidence for bone metastasis.  Increased uptake involving the L2 vertebral body is consistent with  a known compression fracture.  Scoliosis.     Impression and Plan: 71 year old with the following issue:  1. 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 He had a 7.5 cm T3B N0 pathologically (6/6 negative lymph nodes). Staging work up discussed today and continued to show no evidence of disease at this time. I discussed the option of adjuvant chemotherapy in the form of cisplatin and gemcitabine. The risks and benefits of this regimen discussed in details including complications such as: Nausea, vomiting, myelosuppression, hair loss, neuropathy and renal dysfunction. The benefits that at this time was also discussed I feel that he has a high risk of relapse malignancy with a rather aggressive features despite that the I still believe the benefit is rather marginal but this time in the neighborhood about 5-10% decrease in the risk of relapse. Based on these findings and the discussion today Mr. Visser elected to proceed with active surveillance for the time being and he is willing to proceed with systemic chemotherapy it is measurable disease detected.  For that reason, I will plan on active surveillance at this time with followup every 2 months and then repeat scans every 4 months at least for the first year. He understands of this tumor relapses the likelihood of cure becomes very slim.   2. History of prostate cancer: Not active at this time without any evidence of relapse.       [facility], MD 6/14/20133:56 PM

## 2012-02-24 ENCOUNTER — Other Ambulatory Visit (HOSPITAL_BASED_OUTPATIENT_CLINIC_OR_DEPARTMENT_OTHER): Payer: Medicare Other | Admitting: Lab

## 2012-02-24 ENCOUNTER — Ambulatory Visit (HOSPITAL_BASED_OUTPATIENT_CLINIC_OR_DEPARTMENT_OTHER): Payer: Medicare Other | Admitting: Oncology

## 2012-02-24 ENCOUNTER — Telehealth: Payer: Self-pay | Admitting: Oncology

## 2012-02-24 VITALS — BP 131/81 | HR 66 | Temp 97.1°F | Resp 20 | Ht 72.0 in | Wt 219.9 lb

## 2012-02-24 DIAGNOSIS — Z8546 Personal history of malignant neoplasm of prostate: Secondary | ICD-10-CM

## 2012-02-24 DIAGNOSIS — C649 Malignant neoplasm of unspecified kidney, except renal pelvis: Secondary | ICD-10-CM

## 2012-02-24 DIAGNOSIS — C679 Malignant neoplasm of bladder, unspecified: Secondary | ICD-10-CM

## 2012-02-24 LAB — CBC WITH DIFFERENTIAL/PLATELET
Eosinophils Absolute: 0 10*3/uL (ref 0.0–0.5)
HCT: 38.5 % (ref 38.4–49.9)
LYMPH%: 34 % (ref 14.0–49.0)
MONO#: 0.7 10*3/uL (ref 0.1–0.9)
NEUT#: 3 10*3/uL (ref 1.5–6.5)
Platelets: 174 10*3/uL (ref 140–400)
RBC: 4.89 10*6/uL (ref 4.20–5.82)
WBC: 5.7 10*3/uL (ref 4.0–10.3)
lymph#: 1.9 10*3/uL (ref 0.9–3.3)

## 2012-02-24 LAB — COMPREHENSIVE METABOLIC PANEL
Albumin: 3.8 g/dL (ref 3.5–5.2)
CO2: 23 mEq/L (ref 19–32)
Calcium: 8.9 mg/dL (ref 8.4–10.5)
Chloride: 110 mEq/L (ref 96–112)
Glucose, Bld: 104 mg/dL — ABNORMAL HIGH (ref 70–99)
Sodium: 140 mEq/L (ref 135–145)
Total Bilirubin: 0.6 mg/dL (ref 0.3–1.2)
Total Protein: 7 g/dL (ref 6.0–8.3)

## 2012-02-24 NOTE — Telephone Encounter (Signed)
Gave pt appt for October 2013 lab and CT then MD visit , gave pt oral contrast and NPO 4 hours prior to CT

## 2012-02-24 NOTE — Progress Notes (Signed)
Hematology and Oncology Follow Up Visit  Joe Park [id number] 10-22-1940 71 y.o. 02/24/2012 1:33 PM    Principle Diagnosis: 71 year old with bladder cancer diagnosed in 09/2010.   Prior Therapy: He is S/P cystoprostatectomy and bilateral pelvic lymph node dissection and creation of an ileal conduit done on 11/11/2011. 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  The malignancy extends through the bladder wall into the perivesical soft tissue. The malignancy identified at the black anterior surface of he bladder. Four out of 4 lymph nodes in the right pelvis were negative and 6 out of 6 in the left pelvis were negative. The pathological staging was T3B N0.  Secondary Diagnosis: Prostate cancer diagnosed in 2002. No relapse at this time.   Current therapy: Observations and Surveillance.   Interim History:  Joe Park returns today for a follow up visit. He is a gentleman I saw on 11/27/2011 for evaluation of bladder cancer. As mentioned, he underwent cystoprostatectomy on 11/11/2011 with pathology revealing high-grade malignancy with sarcomatoid features. Patient is recovering very well. He had been doing very well. He had not reported any chest pain or shortness of breath had not reported any difficulty breathing had not reported any abdominal pain. He is doing well with his urostomy back. Is not reporting any other complaints at the time being he have regained most activities of daily living including excellent appetite and weight gain. No bleeding or pain noted today.   Medications: I have reviewed the patient's current medications. Current outpatient prescriptions:acetaminophen (TYLENOL) 325 MG tablet, Take 2 tablets (650 mg total) by mouth every 6 (six) hours as needed (or Fever >/= 101)., Disp: 30 tablet, Rfl: 1;  aspirin 81 MG chewable tablet, Chew 81 mg by mouth daily with  breakfast. , Disp: , Rfl: ;  docusate sodium (COLACE) 100 MG capsule, Take 100 mg by mouth 2 (two) times daily., Disp: , Rfl:  levothyroxine (SYNTHROID, LEVOTHROID) 125 MCG tablet, Take 125 mcg by mouth daily with breakfast. , Disp: , Rfl: ;  lisinopril (PRINIVIL,ZESTRIL) 40 MG tablet, Take 40 mg by mouth every morning., Disp: , Rfl: ;  mirtazapine (REMERON) 15 MG tablet, Take 15 mg by mouth at bedtime., Disp: , Rfl: ;  Multiple Vitamin (MULITIVITAMIN WITH MINERALS) TABS, Take 1 tablet by mouth every morning., Disp: , Rfl:  simvastatin (ZOCOR) 40 MG tablet, Take 40 mg by mouth at bedtime., Disp: , Rfl: ;  DISCONTD: hydrALAZINE (APRESOLINE) 25 MG tablet, Take 12.5 mg by mouth every morning., Disp: , Rfl:   Allergies: No Known Allergies  Past Medical History, Surgical history, Social history, and Family History were reviewed and updated.  Review of Systems: Constitutional:  Negative for fever, chills, night sweats, anorexia, weight loss, pain. Cardiovascular: no chest pain or dyspnea on exertion Respiratory: negative Neurological: negative Dermatological: negative ENT: negative Skin: Negative. Gastrointestinal: negative Genito-Urinary: negative Hematological and Lymphatic: negative Breast: negative Musculoskeletal: negative Remaining ROS negative. Physical Exam: Blood pressure 131/81, pulse 66, temperature 97.1 F (36.2 C), temperature source Oral, resp. rate 20, height 6' (1.829 m), weight 219 lb 14.4 oz (99.746 kg). ECOG: 1 General appearance: alert Head: Normocephalic, without obvious abnormality, atraumatic Neck: no adenopathy, no carotid bruit, no JVD, supple, symmetrical, trachea midline and thyroid not enlarged, symmetric, no tenderness/mass/nodules Lymph nodes: Cervical, supraclavicular, and axillary nodes normal. Heart:regular rate and rhythm, S1, S2 normal, no murmur, click, rub or gallop Lung:chest clear,  no wheezing, rales, normal symmetric air entry Abdomin: soft,  non-tender, without masses or organomegaly EXT:no erythema, induration, or nodules   Lab Results: Lab Results  Component Value Date   WBC 5.7 02/24/2012   HGB 12.5* 02/24/2012   HCT 38.5 02/24/2012   MCV 78.7* 02/24/2012   PLT 174 02/24/2012     Chemistry      Component Value Date/Time   NA 137 12/25/2011 1512   K 3.8 12/25/2011 1512   CL 105 12/25/2011 1512   CO2 21 12/25/2011 1512   BUN 19 12/25/2011 1512   CREATININE 0.98 12/25/2011 1512      Component Value Date/Time   CALCIUM 9.2 12/25/2011 1512   ALKPHOS 92 12/25/2011 1512   AST 20 12/25/2011 1512   ALT 18 12/25/2011 1512   BILITOT 0.4 12/25/2011 1512     Impression and Plan: 72 year old with the following issue:  1. 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 Staging work up in 12/2011 did not show evidence of disease. He has refused adjuvant chemotherapy based on my discussion with him and his family in 12/2011.The plan is to proceed with active surveillance for the time being and he is willing to proceed with systemic chemotherapy it is measurable disease detected. I will repeat CT scans in 04/2012.  2. History of prostate cancer: Not active at this time without any evidence of relapse.   [facility], MD 8/14/20131:33 PM

## 2012-04-26 ENCOUNTER — Other Ambulatory Visit (HOSPITAL_BASED_OUTPATIENT_CLINIC_OR_DEPARTMENT_OTHER): Payer: Medicare Other | Admitting: Lab

## 2012-04-26 ENCOUNTER — Ambulatory Visit (HOSPITAL_COMMUNITY)
Admission: RE | Admit: 2012-04-26 | Discharge: 2012-04-26 | Disposition: A | Discharge status: Home / Self Care | Payer: Medicare Other | Source: Ambulatory Visit | Attending: Oncology | Admitting: Oncology

## 2012-04-26 DIAGNOSIS — C679 Malignant neoplasm of bladder, unspecified: Secondary | ICD-10-CM

## 2012-04-26 DIAGNOSIS — C61 Malignant neoplasm of prostate: Secondary | ICD-10-CM | POA: Insufficient documentation

## 2012-04-26 DIAGNOSIS — M4120 Other idiopathic scoliosis, site unspecified: Secondary | ICD-10-CM | POA: Insufficient documentation

## 2012-04-26 DIAGNOSIS — X58XXXA Exposure to other specified factors, initial encounter: Secondary | ICD-10-CM | POA: Insufficient documentation

## 2012-04-26 DIAGNOSIS — Z906 Acquired absence of other parts of urinary tract: Secondary | ICD-10-CM | POA: Insufficient documentation

## 2012-04-26 DIAGNOSIS — S32009A Unspecified fracture of unspecified lumbar vertebra, initial encounter for closed fracture: Secondary | ICD-10-CM | POA: Insufficient documentation

## 2012-04-26 DIAGNOSIS — K7689 Other specified diseases of liver: Secondary | ICD-10-CM | POA: Insufficient documentation

## 2012-04-26 DIAGNOSIS — M129 Arthropathy, unspecified: Secondary | ICD-10-CM | POA: Insufficient documentation

## 2012-04-26 DIAGNOSIS — Z938 Other artificial opening status: Secondary | ICD-10-CM | POA: Insufficient documentation

## 2012-04-26 DIAGNOSIS — N289 Disorder of kidney and ureter, unspecified: Secondary | ICD-10-CM | POA: Insufficient documentation

## 2012-04-26 LAB — CBC WITH DIFFERENTIAL/PLATELET
BASO%: 0.3 % (ref 0.0–2.0)
Eosinophils Absolute: 0.1 10*3/uL (ref 0.0–0.5)
MCHC: 33.1 g/dL (ref 32.0–36.0)
MONO#: 0.7 10*3/uL (ref 0.1–0.9)
NEUT#: 3.1 10*3/uL (ref 1.5–6.5)
Platelets: 174 10*3/uL (ref 140–400)
RBC: 5.43 10*6/uL (ref 4.20–5.82)
RDW: 18.4 % — ABNORMAL HIGH (ref 11.0–14.6)
WBC: 6 10*3/uL (ref 4.0–10.3)
lymph#: 2.1 10*3/uL (ref 0.9–3.3)

## 2012-04-26 LAB — COMPREHENSIVE METABOLIC PANEL (CC13)
ALT: 19 U/L (ref 0–55)
Albumin: 3.7 g/dL (ref 3.5–5.0)
CO2: 23 mEq/L (ref 22–29)
Glucose: 98 mg/dl (ref 70–99)
Potassium: 3.7 mEq/L (ref 3.5–5.1)
Sodium: 139 mEq/L (ref 136–145)
Total Protein: 7.8 g/dL (ref 6.4–8.3)

## 2012-04-26 MED ORDER — IOHEXOL 300 MG/ML  SOLN
125.0000 mL | Freq: Once | INTRAMUSCULAR | Status: AC | PRN
  Administered 2012-04-26: 125 mL via INTRAVENOUS

## 2012-04-28 ENCOUNTER — Telehealth: Payer: Self-pay | Admitting: Oncology

## 2012-04-28 ENCOUNTER — Ambulatory Visit (HOSPITAL_BASED_OUTPATIENT_CLINIC_OR_DEPARTMENT_OTHER): Payer: Medicare Other | Admitting: Oncology

## 2012-04-28 VITALS — BP 125/74 | HR 65 | Temp 96.8°F | Resp 20 | Ht 72.0 in | Wt 229.3 lb

## 2012-04-28 DIAGNOSIS — Z8546 Personal history of malignant neoplasm of prostate: Secondary | ICD-10-CM

## 2012-04-28 DIAGNOSIS — C679 Malignant neoplasm of bladder, unspecified: Secondary | ICD-10-CM

## 2012-04-28 NOTE — Progress Notes (Signed)
Hematology and Oncology Follow Up Visit  Joe Park [id number] 12/25/1940 71 y.o. 04/28/2012 1:44 PM   Principle Diagnosis: 71 year old with bladder cancer diagnosed in 09/2010.   Prior Therapy: He is S/P cystoprostatectomy and bilateral pelvic lymph node dissection and creation of an ileal conduit done on 11/11/2011. 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  The malignancy extends through the bladder wall into the perivesical soft tissue. The malignancy identified at the black anterior surface of he bladder. Four out of 4 lymph nodes in the right pelvis were negative and 6 out of 6 in the left pelvis were negative. The pathological staging was T3B N0.  Secondary Diagnosis: Prostate cancer diagnosed in 2002. No relapse at this time.   Current therapy: Observations and Surveillance.   Interim History:  [name] returns today for a follow up visit. He is a gentleman with bladder cancer. As mentioned, he underwent cystoprostatectomy on 11/11/2011 with pathology revealing high-grade malignancy with sarcomatoid features. Patient is recovering very well. He had been doing very well. He had not reported any chest pain or shortness of breath had not reported any difficulty breathing had not reported any abdominal pain. He is doing well with his urostomy back. Is not reporting any other complaints at the time being he have regained most activities of daily living including excellent appetite and weight gain. No bleeding or pain noted today.   Medications: I have reviewed the patient's current medications. Current outpatient prescriptions:acetaminophen (TYLENOL) 325 MG tablet, Take 2 tablets (650 mg total) by mouth every 6 (six) hours as needed (or Fever >/= 101)., Disp: 30 tablet, Rfl: 1;  aspirin 81 MG chewable tablet, Chew 81 mg by mouth daily with breakfast. , Disp: , Rfl: ;   docusate sodium (COLACE) 100 MG capsule, Take 100 mg by mouth 2 (two) times daily., Disp: , Rfl:  levothyroxine (SYNTHROID, LEVOTHROID) 125 MCG tablet, Take 125 mcg by mouth daily with breakfast. , Disp: , Rfl: ;  lisinopril (PRINIVIL,ZESTRIL) 40 MG tablet, Take 40 mg by mouth every morning., Disp: , Rfl: ;  mirtazapine (REMERON) 15 MG tablet, Take 15 mg by mouth at bedtime., Disp: , Rfl: ;  Multiple Vitamin (MULITIVITAMIN WITH MINERALS) TABS, Take 1 tablet by mouth every morning., Disp: , Rfl:  simvastatin (ZOCOR) 40 MG tablet, Take 40 mg by mouth at bedtime., Disp: , Rfl: ;  DISCONTD: hydrALAZINE (APRESOLINE) 25 MG tablet, Take 12.5 mg by mouth every morning., Disp: , Rfl:   Allergies: No Known Allergies  Past Medical History, Surgical history, Social history, and Family History were reviewed and updated.  Review of Systems: Constitutional:  Negative for fever, chills, night sweats, anorexia, weight loss, pain. Cardiovascular: no chest pain or dyspnea on exertion Respiratory: negative Neurological: negative Dermatological: negative ENT: negative Skin: Negative. Gastrointestinal: negative Genito-Urinary: negative Hematological and Lymphatic: negative Breast: negative Musculoskeletal: negative Remaining ROS negative. Physical Exam: Blood pressure 125/74, pulse 65, temperature 96.8 F (36 C), temperature source Oral, resp. rate 20, height 6' (1.829 m), weight 229 lb 4.8 oz (104.01 kg). ECOG: 1 General appearance: alert Head: Normocephalic, without obvious abnormality, atraumatic Neck: no adenopathy, no carotid bruit, no JVD, supple, symmetrical, trachea midline and thyroid not enlarged, symmetric, no tenderness/mass/nodules Lymph nodes: Cervical, supraclavicular, and axillary nodes normal. Heart:regular rate and rhythm, S1, S2 normal, no murmur, click, rub or gallop Lung:chest clear, no wheezing, rales, normal symmetric air entry  Abdomin: soft, non-tender, without masses or  organomegaly EXT:no erythema, induration, or nodules   Lab Results: Lab Results  Component Value Date   WBC 6.0 04/26/2012   HGB 14.8 04/26/2012   HCT 44.7 04/26/2012   MCV 82.3 04/26/2012   PLT 174 04/26/2012     Chemistry      Component Value Date/Time   NA 139 04/26/2012 0919   NA 140 02/24/2012 1249   K 3.7 04/26/2012 0919   K 3.9 02/24/2012 1249   CL 106 04/26/2012 0919   CL 110 02/24/2012 1249   CO2 23 04/26/2012 0919   CO2 23 02/24/2012 1249   BUN 16.0 04/26/2012 0919   BUN 18 02/24/2012 1249   CREATININE 1.1 04/26/2012 0919   CREATININE 0.98 02/24/2012 1249      Component Value Date/Time   CALCIUM 9.6 04/26/2012 0919   CALCIUM 8.9 02/24/2012 1249   ALKPHOS 102 04/26/2012 0919   ALKPHOS 85 02/24/2012 1249   AST 23 04/26/2012 0919   AST 18 02/24/2012 1249   ALT 19 04/26/2012 0919   ALT 12 02/24/2012 1249   BILITOT 0.90 04/26/2012 0919   BILITOT 0.6 02/24/2012 1249     CT CHEST, ABDOMEN AND PELVIS WITH CONTRAST  Technique: Multidetector CT imaging of the chest, abdomen and  pelvis was performed following the standard protocol during bolus  administration of intravenous contrast.  Contrast: OMNIPAQUE IOHEXOL 300 MG/ML SOLN  Comparison: Multiple exams, including 12/23/2011  CT CHEST  Findings: No pathologic thoracic adenopathy. No pleural or  pericardial effusion. Left upper lobe bulla medially. 4 mm stable  subpleural lymph node along the left major fissure. No new  findings  IMPRESSION:  1. No findings worrisome for thoracic malignancy.  CT ABDOMEN AND PELVIS  Findings: Diffuse hepatic steatosis. Spleen, pancreas, and  adrenal glands appear stable and unremarkable aside from slight  fullness of the medial limb of the right adrenal gland.  Gallbladder contracted but otherwise unremarkable.  Right ileal conduit noted. No hydronephrosis or hydroureter.  Orally administered contrast extends through to the colon. No  pathologic retroperitoneal or porta hepatis  adenopathy is  identified.  No pathologic pelvic adenopathy is identified.  Levoconvex lumbar scoliosis noted with rotary component. Tiny  hypodense renal lesions are noted bilaterally, likely small cysts  but technically nonspecific due to small size. These are stable.  Prostate seed implants noted. Urinary bladder absent. Rectum  unremarkable.  Stable appearance of L2 compression fracture. Lumbar facet  arthropathy noted on the right particularly at L5-S1 and L3-4.  Suspected disc bulge at L4-5.  IMPRESSION:  1. Overall stable, without findings of abdominal or pelvic  malignancy.  2. Diffuse hepatic steatosis.  Impression and Plan: 71 year old with the following issue: 1. 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 CT scans on 04/26/2012 was discussed today with the patient and did not show evidence of disease. He has refused adjuvant chemotherapy based on my discussion with him and his family in 12/2011.The plan is to proceed with active surveillance for the time being and he is willing to proceed with systemic chemotherapy it is measurable disease detected. I will repeat CT scans in 6-8 months.  2. History of prostate cancer: Not active at this time without any evidence of relapse.   Eli Hose, MD 10/17/20131:44 PM

## 2012-04-28 NOTE — Telephone Encounter (Signed)
gv pt appt schedule for February 2014. °

## 2012-09-01 ENCOUNTER — Telehealth: Payer: Self-pay | Admitting: Oncology

## 2012-09-01 ENCOUNTER — Other Ambulatory Visit (HOSPITAL_BASED_OUTPATIENT_CLINIC_OR_DEPARTMENT_OTHER): Payer: Medicare Other

## 2012-09-01 ENCOUNTER — Ambulatory Visit (HOSPITAL_BASED_OUTPATIENT_CLINIC_OR_DEPARTMENT_OTHER): Payer: Medicare Other | Admitting: Oncology

## 2012-09-01 VITALS — BP 136/79 | HR 63 | Temp 97.8°F | Resp 20 | Ht 72.0 in | Wt 228.0 lb

## 2012-09-01 LAB — CBC WITH DIFFERENTIAL/PLATELET
BASO%: 0.7 % (ref 0.0–2.0)
Eosinophils Absolute: 0 10*3/uL (ref 0.0–0.5)
LYMPH%: 33.3 % (ref 14.0–49.0)
MCHC: 34.3 g/dL (ref 32.0–36.0)
MCV: 86.1 fL (ref 79.3–98.0)
MONO#: 0.7 10*3/uL (ref 0.1–0.9)
MONO%: 11.8 % (ref 0.0–14.0)
NEUT#: 3.3 10*3/uL (ref 1.5–6.5)
RBC: 4.98 10*6/uL (ref 4.20–5.82)
RDW: 15.7 % — ABNORMAL HIGH (ref 11.0–14.6)
WBC: 6.2 10*3/uL (ref 4.0–10.3)

## 2012-09-01 LAB — COMPREHENSIVE METABOLIC PANEL (CC13)
ALT: 18 U/L (ref 0–55)
Albumin: 3.3 g/dL — ABNORMAL LOW (ref 3.5–5.0)
Alkaline Phosphatase: 92 U/L (ref 40–150)
Glucose: 92 mg/dl (ref 70–99)
Potassium: 4.1 mEq/L (ref 3.5–5.1)
Sodium: 140 mEq/L (ref 136–145)
Total Bilirubin: 1.18 mg/dL (ref 0.20–1.20)
Total Protein: 7.1 g/dL (ref 6.4–8.3)

## 2012-09-01 NOTE — Progress Notes (Signed)
Hematology and Oncology Follow Up Visit  Joe Park [id number] 13-Sep-1940 72 y.o. 09/01/2012 1:35 PM   Principle Diagnosis: 72 year old with bladder cancer diagnosed in 09/2010.   Prior Therapy: He is S/P cystoprostatectomy and bilateral pelvic lymph node dissection and creation of an ileal conduit done on 11/11/2011. 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  The malignancy extends through the bladder wall into the perivesical soft tissue. The malignancy identified at the black anterior surface of he bladder. Four out of 4 lymph nodes in the right pelvis were negative and 6 out of 6 in the left pelvis were negative. The pathological staging was T3B N0.  Secondary Diagnosis: Prostate cancer diagnosed in 2002. No relapse at this time. He elected against chemotherapy.   Current therapy: Observations and Surveillance.   Interim History:  [name] returns today for a follow up visit. He is a gentleman with bladder cancer. He is S/P cystoprostatectomy on 11/11/2011 with pathology revealing high-grade malignancy with sarcomatoid features. Patient is recoved very well. He had been doing very well. He had not reported any chest pain or shortness of breath had not reported any difficulty breathing had not reported any abdominal pain. He is doing well with his urostomy back. Is not reporting any other complaints at the time being he have regained most activities of daily living including excellent appetite and weight gain. No bleeding or pain noted today.   Medications: I have reviewed the patient's current medications. Current outpatient prescriptions:acetaminophen (TYLENOL) 325 MG tablet, Take 2 tablets (650 mg total) by mouth every 6 (six) hours as needed (or Fever >/= 101)., Disp: 30 tablet, Rfl: 1;  aspirin 81 MG chewable tablet, Chew 81 mg by mouth daily with breakfast. , Disp: ,  Rfl: ;  docusate sodium (COLACE) 100 MG capsule, Take 100 mg by mouth 2 (two) times daily., Disp: , Rfl:  levothyroxine (SYNTHROID, LEVOTHROID) 125 MCG tablet, Take 125 mcg by mouth daily with breakfast. , Disp: , Rfl: ;  lisinopril (PRINIVIL,ZESTRIL) 40 MG tablet, Take 40 mg by mouth every morning., Disp: , Rfl: ;  mirtazapine (REMERON) 15 MG tablet, Take 15 mg by mouth at bedtime., Disp: , Rfl: ;  Multiple Vitamin (MULITIVITAMIN WITH MINERALS) TABS, Take 1 tablet by mouth every morning., Disp: , Rfl:  simvastatin (ZOCOR) 40 MG tablet, Take 40 mg by mouth at bedtime., Disp: , Rfl: ;  [DISCONTINUED] hydrALAZINE (APRESOLINE) 25 MG tablet, Take 12.5 mg by mouth every morning., Disp: , Rfl:   Allergies: No Known Allergies  Past Medical History, Surgical history, Social history, and Family History were reviewed and updated.  Review of Systems: Constitutional:  Negative for fever, chills, night sweats, anorexia, weight loss, pain. Cardiovascular: no chest pain or dyspnea on exertion Respiratory: negative Neurological: negative Dermatological: negative ENT: negative Skin: Negative. Gastrointestinal: negative Genito-Urinary: negative Hematological and Lymphatic: negative Breast: negative Musculoskeletal: negative Remaining ROS negative. Physical Exam: Blood pressure 136/79, pulse 63, temperature 97.8 F (36.6 C), temperature source Oral, resp. rate 20, height 6' (1.829 m), weight 228 lb (103.42 kg). ECOG: 1 General appearance: alert Head: Normocephalic, without obvious abnormality, atraumatic Neck: no adenopathy, no carotid bruit, no JVD, supple, symmetrical, trachea midline and thyroid not enlarged, symmetric, no tenderness/mass/nodules Lymph nodes: Cervical, supraclavicular, and axillary nodes normal. Heart:regular rate and rhythm, S1, S2 normal, no murmur, click, rub or gallop Lung:chest clear, no wheezing, rales, normal symmetric air  entry Abdomin: soft, non-tender, without masses or  organomegaly EXT:no erythema, induration, or nodules   Lab Results: Lab Results  Component Value Date   WBC 6.2 09/01/2012   HGB 14.7 09/01/2012   HCT 42.9 09/01/2012   MCV 86.1 09/01/2012   PLT 160 09/01/2012     Chemistry      Component Value Date/Time   NA 139 04/26/2012 0919   NA 140 02/24/2012 1249   K 3.7 04/26/2012 0919   K 3.9 02/24/2012 1249   CL 106 04/26/2012 0919   CL 110 02/24/2012 1249   CO2 23 04/26/2012 0919   CO2 23 02/24/2012 1249   BUN 16.0 04/26/2012 0919   BUN 18 02/24/2012 1249   CREATININE 1.1 04/26/2012 0919   CREATININE 0.98 02/24/2012 1249      Component Value Date/Time   CALCIUM 9.6 04/26/2012 0919   CALCIUM 8.9 02/24/2012 1249   ALKPHOS 102 04/26/2012 0919   ALKPHOS 85 02/24/2012 1249   AST 23 04/26/2012 0919   AST 18 02/24/2012 1249   ALT 19 04/26/2012 0919   ALT 12 02/24/2012 1249   BILITOT 0.90 04/26/2012 0919   BILITOT 0.6 02/24/2012 1249      Impression and Plan: 72 year old with the following issue: 1. 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 CT scans on 04/26/2012 did not show evidence of disease. He has refused adjuvant chemotherapy based on my discussion with him and his family in 12/2011.The plan is to proceed with active surveillance for the time being and he is willing to proceed with systemic chemotherapy it is measurable disease detected. I will repeat CT scans in 12/2012.  2. History of prostate cancer: Not active at this time without any evidence of relapse.   Eli Hose, MD 2/20/20141:35 PM

## 2012-09-01 NOTE — Telephone Encounter (Signed)
gv and printed appt schedule for pt for June...gv pt Barium

## 2012-12-04 IMAGING — CR DG CHEST 1V PORT
1 series · 1 of 1 positions shown · non-contrast
Comparison: Two-view chest x-ray 09/17/2011.

CLINICAL DATA: Hypotension.

PORTABLE CHEST - 1 VIEW [DATE]/[id number] hours:

[AP]
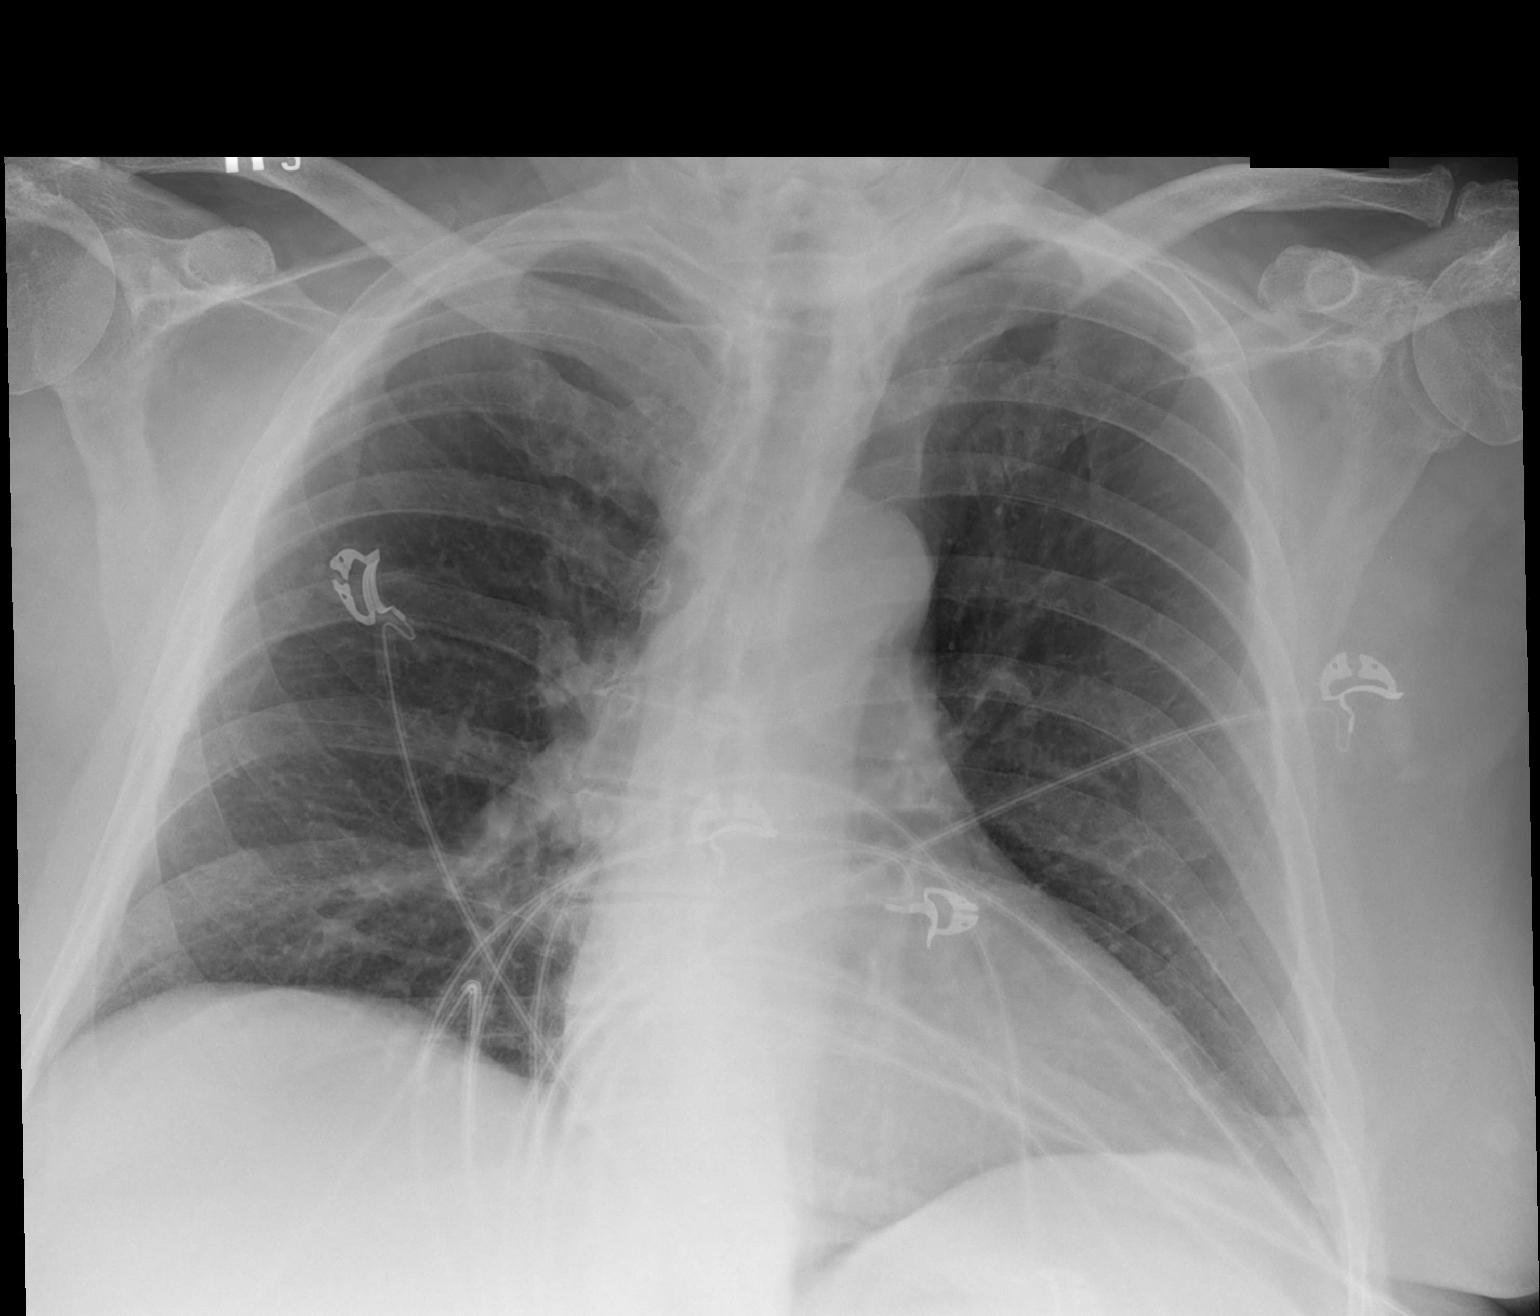

[1 of 1 positions shown; findings below may reference images not displayed]

FINDINGS: Cardiac silhouette normal and mediastinal contours
unremarkable for the AP portable technique.  Lungs clear.
Pulmonary vascularity normal.  Bronchovascular markings normal.  No
pneumothorax.  No pleural effusions.
IMPRESSION: No acute cardiopulmonary disease.

## 2012-12-27 ENCOUNTER — Other Ambulatory Visit (HOSPITAL_BASED_OUTPATIENT_CLINIC_OR_DEPARTMENT_OTHER): Payer: Medicare Other | Admitting: Lab

## 2012-12-27 ENCOUNTER — Ambulatory Visit (HOSPITAL_COMMUNITY)
Admission: RE | Admit: 2012-12-27 | Discharge: 2012-12-27 | Disposition: A | Discharge status: Home / Self Care | Payer: Medicare Other | Source: Ambulatory Visit | Attending: Oncology | Admitting: Oncology

## 2012-12-27 DIAGNOSIS — C61 Malignant neoplasm of prostate: Secondary | ICD-10-CM | POA: Insufficient documentation

## 2012-12-27 DIAGNOSIS — K5730 Diverticulosis of large intestine without perforation or abscess without bleeding: Secondary | ICD-10-CM | POA: Insufficient documentation

## 2012-12-27 DIAGNOSIS — Z923 Personal history of irradiation: Secondary | ICD-10-CM | POA: Insufficient documentation

## 2012-12-27 DIAGNOSIS — Z906 Acquired absence of other parts of urinary tract: Secondary | ICD-10-CM | POA: Insufficient documentation

## 2012-12-27 DIAGNOSIS — C679 Malignant neoplasm of bladder, unspecified: Secondary | ICD-10-CM

## 2012-12-27 DIAGNOSIS — IMO0002 Reserved for concepts with insufficient information to code with codable children: Secondary | ICD-10-CM | POA: Insufficient documentation

## 2012-12-27 DIAGNOSIS — K7689 Other specified diseases of liver: Secondary | ICD-10-CM | POA: Insufficient documentation

## 2012-12-27 DIAGNOSIS — J439 Emphysema, unspecified: Secondary | ICD-10-CM | POA: Insufficient documentation

## 2012-12-27 DIAGNOSIS — I700 Atherosclerosis of aorta: Secondary | ICD-10-CM | POA: Insufficient documentation

## 2012-12-27 DIAGNOSIS — N281 Cyst of kidney, acquired: Secondary | ICD-10-CM | POA: Insufficient documentation

## 2012-12-27 DIAGNOSIS — K4090 Unilateral inguinal hernia, without obstruction or gangrene, not specified as recurrent: Secondary | ICD-10-CM | POA: Insufficient documentation

## 2012-12-27 LAB — CBC WITH DIFFERENTIAL/PLATELET
BASO%: 0.2 % (ref 0.0–2.0)
EOS%: 0.8 % (ref 0.0–7.0)
LYMPH%: 30.1 % (ref 14.0–49.0)
MCH: 30.3 pg (ref 27.2–33.4)
MCHC: 34.2 g/dL (ref 32.0–36.0)
MCV: 88.6 fL (ref 79.3–98.0)
MONO%: 12 % (ref 0.0–14.0)
NEUT#: 3.5 10*3/uL (ref 1.5–6.5)
Platelets: 177 10*3/uL (ref 140–400)
RBC: 5.01 10*6/uL (ref 4.20–5.82)
RDW: 13.8 % (ref 11.0–14.6)

## 2012-12-27 LAB — COMPREHENSIVE METABOLIC PANEL (CC13)
AST: 24 U/L (ref 5–34)
Albumin: 3.5 g/dL (ref 3.5–5.0)
Alkaline Phosphatase: 105 U/L (ref 40–150)
Glucose: 108 mg/dl — ABNORMAL HIGH (ref 70–99)
Potassium: 3.9 mEq/L (ref 3.5–5.1)
Sodium: 140 mEq/L (ref 136–145)
Total Bilirubin: 1.22 mg/dL — ABNORMAL HIGH (ref 0.20–1.20)
Total Protein: 7.6 g/dL (ref 6.4–8.3)

## 2012-12-27 MED ORDER — IOHEXOL 300 MG/ML  SOLN
100.0000 mL | Freq: Once | INTRAMUSCULAR | Status: AC | PRN
  Administered 2012-12-27: 125 mL via INTRAVENOUS

## 2012-12-30 ENCOUNTER — Ambulatory Visit (HOSPITAL_BASED_OUTPATIENT_CLINIC_OR_DEPARTMENT_OTHER): Payer: Medicare Other | Admitting: Oncology

## 2012-12-30 ENCOUNTER — Telehealth: Payer: Self-pay | Admitting: Oncology

## 2012-12-30 ENCOUNTER — Encounter: Payer: Self-pay | Admitting: *Deleted

## 2012-12-30 VITALS — BP 127/73 | HR 78 | Temp 98.0°F | Resp 18 | Ht 72.0 in | Wt 235.7 lb

## 2012-12-30 DIAGNOSIS — C679 Malignant neoplasm of bladder, unspecified: Secondary | ICD-10-CM

## 2012-12-30 NOTE — Progress Notes (Signed)
Patient given signed handicap sticker. 

## 2012-12-30 NOTE — Progress Notes (Signed)
Hematology and Oncology Follow Up Visit  Joe Park [id number] 1941/01/21 72 y.o. 12/30/2012 1:08 PM   Principle Diagnosis: 72 year old with bladder cancer diagnosed in 09/2010.   Prior Therapy: He is S/P cystoprostatectomy and bilateral pelvic lymph node dissection and creation of an ileal conduit done on 11/11/2011. 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  The malignancy extends through the bladder wall into the perivesical soft tissue. The malignancy identified at the black anterior surface of he bladder. Four out of 4 lymph nodes in the right pelvis were negative and 6 out of 6 in the left pelvis were negative. The pathological staging was T3B N0.  Secondary Diagnosis: Prostate cancer diagnosed in 2002. Not active at this time.   Current therapy: Observations and Surveillance. He declined adjuvant chemotherapy.   Interim History:  [name] returns today for a follow up visit. He is a gentleman with bladder cancer. He is S/P cystoprostatectomy on 11/11/2011 with pathology revealing high-grade malignancy with sarcomatoid features. Patient is recoved very well. He had been doing very well since his last visit. He had not reported any chest pain or shortness of breath had not reported any difficulty breathing had not reported any abdominal pain. He is doing well with his urostomy back. Is not reporting any other complaints at the time being he have regained most activities of daily living including excellent appetite and weight gain. No bleeding or pain noted today. He had not had had any hospitalizations or illness.   Medications: I have reviewed the patient's current medications. Current Outpatient Prescriptions  Medication Sig Dispense Refill  . aspirin 81 MG chewable tablet Chew 81 mg by mouth daily with breakfast.       . levothyroxine (SYNTHROID, LEVOTHROID) 125  MCG tablet Take 125 mcg by mouth daily with breakfast.       . lisinopril (PRINIVIL,ZESTRIL) 40 MG tablet Take 40 mg by mouth every morning.      . Multiple Vitamin (MULITIVITAMIN WITH MINERALS) TABS Take 1 tablet by mouth every morning.      . simvastatin (ZOCOR) 40 MG tablet Take 40 mg by mouth at bedtime.      . [DISCONTINUED] hydrALAZINE (APRESOLINE) 25 MG tablet Take 12.5 mg by mouth every morning.       No current facility-administered medications for this visit.    Allergies: No Known Allergies  Past Medical History, Surgical history, Social history, and Family History were reviewed and updated.  Review of Systems: Constitutional:  Negative for fever, chills, night sweats, anorexia, weight loss, pain. Cardiovascular: no chest pain or dyspnea on exertion Respiratory: negative Neurological: negative Dermatological: negative ENT: negative Skin: Negative. Gastrointestinal: negative Genito-Urinary: negative Hematological and Lymphatic: negative Breast: negative Musculoskeletal: negative Remaining ROS negative. Physical Exam: Blood pressure 127/73, pulse 78, temperature 98 F (36.7 C), temperature source Oral, resp. rate 18, height 6' (1.829 m), weight 235 lb 11.2 oz (106.913 kg), SpO2 95.00%. ECOG: 1 General appearance: alert Head: Normocephalic, without obvious abnormality, atraumatic Neck: no adenopathy, no carotid bruit, no JVD, supple, symmetrical, trachea midline and thyroid not enlarged, symmetric, no tenderness/mass/nodules Lymph nodes: Cervical, supraclavicular, and axillary nodes normal. Heart:regular rate and rhythm, S1, S2 normal, no murmur, click, rub or gallop Lung:chest clear, no wheezing, rales, normal symmetric air entry Abdomin: soft, non-tender, without masses or organomegaly EXT:no erythema, induration, or nodules   Lab Results: Lab Results  Component Value Date  WBC 6.2 12/27/2012   HGB 15.2 12/27/2012   HCT 44.4 12/27/2012   MCV 88.6 12/27/2012    PLT 177 12/27/2012     Chemistry      Component Value Date/Time   NA 140 12/27/2012 0801   NA 140 02/24/2012 1249   K 3.9 12/27/2012 0801   K 3.9 02/24/2012 1249   CL 106 12/27/2012 0801   CL 110 02/24/2012 1249   CO2 25 12/27/2012 0801   CO2 23 02/24/2012 1249   BUN 14.3 12/27/2012 0801   BUN 18 02/24/2012 1249   CREATININE 1.0 12/27/2012 0801   CREATININE 0.98 02/24/2012 1249      Component Value Date/Time   CALCIUM 9.2 12/27/2012 0801   CALCIUM 8.9 02/24/2012 1249   ALKPHOS 105 12/27/2012 0801   ALKPHOS 85 02/24/2012 1249   AST 24 12/27/2012 0801   AST 18 02/24/2012 1249   ALT 21 12/27/2012 0801   ALT 12 02/24/2012 1249   BILITOT 1.22* 12/27/2012 0801   BILITOT 0.6 02/24/2012 1249     CT CHEST WITH ABDOMEN PELVIS BOTH  Technique: Multidetector CT imaging of the abdomen and pelvis was  performed without intravenous contrast. Multidetector CT imaging of  the chest, abdomen and pelvis was then performed during bolus  administration of intravenous contrast.  Contrast: OMNIPAQUE IOHEXOL 300 MG/ML SOLN  Comparison: 04/26/2012.  CT CHEST  Findings: Stable 4 mm subpleural nodule along the left fissure  (series 8/image 33). Minimal nodularity along the right major  fissure (series 8/images 27 28). No new/suspicious pulmonary  nodules. Stable bulla in the medial left upper lobe (series 8/image  13). No pleural effusion or pneumothorax.  Visualized thyroid is unremarkable.  The heart is normal in size. No pericardial effusion.  No suspicious mediastinal, hilar, or axillary lymphadenopathy.  Mild degenerative changes of the thoracic spine.  IMPRESSION:  No evidence of metastatic disease in the chest.  CT ABDOMEN AND PELVIS  Findings: Mild hepatic steatosis.  Spleen, pancreas, and adrenal glands within normal limits.  Gallbladder is unremarkable. No intrahepatic or extrahepatic  ductal dilatation.  Tiny bilateral renal cysts. No hydronephrosis.  No evidence of bowel obstruction. Normal  appendix. Colonic  diverticulosis, without associated inflammatory changes.  Atherosclerotic calcifications of the abdominal aorta and branch  vessels.  No abdominopelvic ascites.  No suspicious abdominopelvic lymphadenopathy.  Brachytherapy seeds in the prostate.  Status post cystectomy with right lower quadrant ileal conduit.  Moderate fat-containing parastomal hernia (series 4/image 84).  Small fat-containing right inguinal hernia.  Degenerative changes of the lumbar spine. Stable mild superior  endplate compression deformity at L2 (series 604/image 76).  IMPRESSION:  Status post cystectomy with right lower quadrant ileal conduit.  Brachytherapy seeds in the prostate.  No evidence of metastatic disease in the abdomen/pelvis.  Stable ancillary findings as above.   Impression and Plan: 72 year old with the following issue: 1. 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 CT scans on 12/2012 was discussed with the patient today and did not show evidence of disease. He has refused adjuvant chemotherapy based on my discussion with him and his family in 12/2011.The plan is to proceed with active surveillance for the time being and he is willing to proceed with systemic chemotherapy it is measurable disease detected. I will repeat CT scans in 8 months.   2. History of prostate cancer: Not active at this time without any evidence of relapse.   Eli Hose, MD 6/20/20141:08 PM

## 2013-03-31 ENCOUNTER — Telehealth: Payer: Self-pay | Admitting: *Deleted

## 2013-03-31 NOTE — Telephone Encounter (Signed)
Lm informed the pt that his appts for 04/04/13 was rs. gv appt d/t for 04/28/13 w/labs@ 1:15pm and ov @ 1:45pm. i also made pt aware that i will mail a letter/avs...td

## 2013-04-04 ENCOUNTER — Other Ambulatory Visit: Payer: Medicare Other | Admitting: Lab

## 2013-04-04 ENCOUNTER — Ambulatory Visit: Payer: Medicare Other | Admitting: Oncology

## 2013-04-28 ENCOUNTER — Other Ambulatory Visit (HOSPITAL_BASED_OUTPATIENT_CLINIC_OR_DEPARTMENT_OTHER): Payer: Medicare Other

## 2013-04-28 ENCOUNTER — Ambulatory Visit (HOSPITAL_BASED_OUTPATIENT_CLINIC_OR_DEPARTMENT_OTHER): Payer: Medicare Other | Admitting: Oncology

## 2013-04-28 ENCOUNTER — Telehealth: Payer: Self-pay | Admitting: Oncology

## 2013-04-28 VITALS — BP 126/82 | HR 73 | Temp 96.3°F | Resp 18 | Ht 72.0 in | Wt 228.1 lb

## 2013-04-28 DIAGNOSIS — C679 Malignant neoplasm of bladder, unspecified: Secondary | ICD-10-CM

## 2013-04-28 LAB — COMPREHENSIVE METABOLIC PANEL (CC13)
AST: 27 U/L (ref 5–34)
Alkaline Phosphatase: 102 U/L (ref 40–150)
BUN: 17.3 mg/dL (ref 7.0–26.0)
Glucose: 85 mg/dl (ref 70–140)
Sodium: 141 mEq/L (ref 136–145)
Total Bilirubin: 1.2 mg/dL (ref 0.20–1.20)

## 2013-04-28 LAB — CBC WITH DIFFERENTIAL/PLATELET
Basophils Absolute: 0.1 10*3/uL (ref 0.0–0.1)
EOS%: 0.5 % (ref 0.0–7.0)
Eosinophils Absolute: 0 10*3/uL (ref 0.0–0.5)
LYMPH%: 36.7 % (ref 14.0–49.0)
MCH: 30.1 pg (ref 27.2–33.4)
MCV: 88.4 fL (ref 79.3–98.0)
MONO%: 14.1 % — ABNORMAL HIGH (ref 0.0–14.0)
NEUT#: 3.8 10*3/uL (ref 1.5–6.5)
Platelets: 215 10*3/uL (ref 140–400)
RBC: 5.11 10*6/uL (ref 4.20–5.82)

## 2013-04-28 NOTE — Telephone Encounter (Signed)
GV AND PRINTED APPT SCHED AND AVS FOR PT FOR FEB .Marland KitchenMarland KitchenGV PT BARIUM

## 2013-04-28 NOTE — Progress Notes (Signed)
Hematology and Oncology Follow Up Visit  Joe Park [id number] 18-Mar-1941 72 y.o. 04/28/2013 1:35 PM   Principle Diagnosis: 72 year old man diagnosed with bladder cancer in 09/2010.   Prior Therapy: He is S/P cystoprostatectomy and bilateral pelvic lymph node dissection and creation of an ileal conduit done on 11/11/2011. 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  The malignancy extends through the bladder wall into the perivesical soft tissue. The malignancy identified at the black anterior surface of he bladder. Four out of 4 lymph nodes in the right pelvis were negative and 6 out of 6 in the left pelvis were negative. The pathological staging was T3B N0.  Secondary Diagnosis: Prostate cancer diagnosed in 2002. Not active at this time.   Current therapy: Observations and Surveillance. He declined adjuvant chemotherapy.   Interim History:  Joe Park returns today for a follow up visit by himself today. He had been doing very well since his last visit. He had not reported any chest pain or shortness of breath had not reported any difficulty breathing had not reported any abdominal pain. He is doing well with his urostomy back. Is not reporting any other complaints at the time being he have regained most activities of daily living including excellent appetite and weight gain. No bleeding or pain noted today. He continues to live alone and have no problems functioning independently. And has not really had any recent hospitalizations or illnesses.  Medications: I have reviewed the patient's current medications. Current Outpatient Prescriptions  Medication Sig Dispense Refill  . aspirin 81 MG chewable tablet Chew 81 mg by mouth daily with breakfast.       . levothyroxine (SYNTHROID, LEVOTHROID) 125 MCG tablet Take 125 mcg by mouth daily with breakfast.       . lisinopril  (PRINIVIL,ZESTRIL) 40 MG tablet Take 40 mg by mouth every morning.      . Multiple Vitamin (MULITIVITAMIN WITH MINERALS) TABS Take 1 tablet by mouth every morning.      . simvastatin (ZOCOR) 40 MG tablet Take 40 mg by mouth at bedtime.      . [DISCONTINUED] hydrALAZINE (APRESOLINE) 25 MG tablet Take 12.5 mg by mouth every morning.       No current facility-administered medications for this visit.    Allergies: No Known Allergies  Past Medical History, Surgical history, Social history, and Family History were reviewed and updated.  Review of Systems:  Remaining ROS negative. Physical Exam: Blood pressure 126/82, pulse 73, temperature 96.3 F (35.7 C), temperature source Oral, resp. rate 18, height 6' (1.829 m), weight 228 lb 1.6 oz (103.465 kg), SpO2 96.00%. ECOG: 1 General appearance: alert Head: Normocephalic, without obvious abnormality, atraumatic Neck: no adenopathy, no carotid bruit, no JVD, supple, symmetrical, trachea midline and thyroid not enlarged, symmetric, no tenderness/mass/nodules Lymph nodes: Cervical, supraclavicular, and axillary nodes normal. Heart:regular rate and rhythm, S1, S2 normal, no murmur, click, rub or gallop Lung:chest clear, no wheezing, rales, normal symmetric air entry Abdomin: soft, non-tender, without masses or organomegaly EXT:no erythema, induration, or nodules   Lab Results: Lab Results  Component Value Date   WBC 7.9 04/28/2013   HGB 15.4 04/28/2013   HCT 45.2 04/28/2013   MCV 88.4 04/28/2013   PLT 215 04/28/2013     Chemistry      Component Value Date/Time   NA 140 12/27/2012 0801   NA 140 02/24/2012 1249  K 3.9 12/27/2012 0801   K 3.9 02/24/2012 1249   CL 106 12/27/2012 0801   CL 110 02/24/2012 1249   CO2 25 12/27/2012 0801   CO2 23 02/24/2012 1249   BUN 14.3 12/27/2012 0801   BUN 18 02/24/2012 1249   CREATININE 1.0 12/27/2012 0801   CREATININE 0.98 02/24/2012 1249      Component Value Date/Time   CALCIUM 9.2 12/27/2012 0801    CALCIUM 8.9 02/24/2012 1249   ALKPHOS 105 12/27/2012 0801   ALKPHOS 85 02/24/2012 1249   AST 24 12/27/2012 0801   AST 18 02/24/2012 1249   ALT 21 12/27/2012 0801   ALT 12 02/24/2012 1249   BILITOT 1.22* 12/27/2012 0801   BILITOT 0.6 02/24/2012 1249      Impression and Plan: 72 year old with the following issue: 1. 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 CT scans on 12/2012 did not show evidence of disease. He has refused adjuvant chemotherapy based on my discussion with him and his family in 12/2011.The plan is to proceed with active surveillance for the time being and he is willing to proceed with systemic chemotherapy it is measurable disease detected. I will repeat CT scans in 4 months.   2. History of prostate cancer: Not active at this time.   Joe Hose, MD 10/17/20141:35 PM

## 2013-08-18 ENCOUNTER — Other Ambulatory Visit: Payer: Self-pay | Admitting: Internal Medicine

## 2013-08-18 DIAGNOSIS — M7918 Myalgia, other site: Secondary | ICD-10-CM

## 2013-08-22 ENCOUNTER — Ambulatory Visit
Admission: RE | Admit: 2013-08-22 | Discharge: 2013-08-22 | Disposition: A | Discharge status: Home / Self Care | Payer: Medicare Other | Source: Ambulatory Visit | Attending: Internal Medicine | Admitting: Internal Medicine

## 2013-08-22 DIAGNOSIS — M7918 Myalgia, other site: Secondary | ICD-10-CM

## 2013-08-29 ENCOUNTER — Ambulatory Visit (HOSPITAL_COMMUNITY): Admission: RE | Admit: 2013-08-29 | Payer: Medicare Other | Source: Ambulatory Visit

## 2013-08-29 ENCOUNTER — Other Ambulatory Visit: Payer: Medicare Other | Admitting: Lab

## 2013-08-29 ENCOUNTER — Other Ambulatory Visit: Payer: Medicare Other

## 2013-08-31 ENCOUNTER — Ambulatory Visit: Payer: Medicare Other | Admitting: Oncology

## 2013-09-06 ENCOUNTER — Telehealth: Payer: Self-pay | Admitting: Oncology

## 2013-09-06 NOTE — Telephone Encounter (Signed)
returned call to sister in law to confim appts for 3/5 ct and 3/11 f/u. sister has d/t/locations and instructions for appts.

## 2013-09-13 ENCOUNTER — Other Ambulatory Visit: Payer: Self-pay | Admitting: Medical Oncology

## 2013-09-13 ENCOUNTER — Telehealth: Payer: Self-pay | Admitting: Oncology

## 2013-09-13 NOTE — Telephone Encounter (Signed)
lmonvm advising the pt of his lab appt on 09/14/2013 at 10:00am

## 2013-09-14 ENCOUNTER — Other Ambulatory Visit: Payer: Self-pay | Admitting: Oncology

## 2013-09-14 ENCOUNTER — Other Ambulatory Visit (HOSPITAL_BASED_OUTPATIENT_CLINIC_OR_DEPARTMENT_OTHER): Payer: Medicare Other

## 2013-09-14 ENCOUNTER — Ambulatory Visit (HOSPITAL_COMMUNITY)
Admission: RE | Admit: 2013-09-14 | Discharge: 2013-09-14 | Disposition: A | Discharge status: Home / Self Care | Payer: Medicare Other | Source: Ambulatory Visit | Attending: Oncology | Admitting: Oncology

## 2013-09-14 ENCOUNTER — Encounter (HOSPITAL_COMMUNITY): Payer: Self-pay

## 2013-09-14 DIAGNOSIS — Z923 Personal history of irradiation: Secondary | ICD-10-CM | POA: Insufficient documentation

## 2013-09-14 DIAGNOSIS — C61 Malignant neoplasm of prostate: Secondary | ICD-10-CM | POA: Insufficient documentation

## 2013-09-14 DIAGNOSIS — M545 Low back pain, unspecified: Secondary | ICD-10-CM | POA: Insufficient documentation

## 2013-09-14 DIAGNOSIS — C679 Malignant neoplasm of bladder, unspecified: Secondary | ICD-10-CM

## 2013-09-14 LAB — CBC WITH DIFFERENTIAL/PLATELET
BASO%: 1.2 % (ref 0.0–2.0)
BASOS ABS: 0.1 10*3/uL (ref 0.0–0.1)
EOS ABS: 0.1 10*3/uL (ref 0.0–0.5)
EOS%: 0.9 % (ref 0.0–7.0)
HEMATOCRIT: 45.9 % (ref 38.4–49.9)
HEMOGLOBIN: 15.4 g/dL (ref 13.0–17.1)
LYMPH#: 1.9 10*3/uL (ref 0.9–3.3)
LYMPH%: 26.6 % (ref 14.0–49.0)
MCH: 29.7 pg (ref 27.2–33.4)
MCHC: 33.5 g/dL (ref 32.0–36.0)
MCV: 88.4 fL (ref 79.3–98.0)
MONO#: 1.1 10*3/uL — AB (ref 0.1–0.9)
MONO%: 14.7 % — ABNORMAL HIGH (ref 0.0–14.0)
NEUT%: 56.6 % (ref 39.0–75.0)
NEUTROS ABS: 4.1 10*3/uL (ref 1.5–6.5)
Platelets: 229 10*3/uL (ref 140–400)
RBC: 5.19 10*6/uL (ref 4.20–5.82)
RDW: 14.1 % (ref 11.0–14.6)
WBC: 7.2 10*3/uL (ref 4.0–10.3)

## 2013-09-14 LAB — COMPREHENSIVE METABOLIC PANEL (CC13)
ALBUMIN: 3.5 g/dL (ref 3.5–5.0)
ALT: 38 U/L (ref 0–55)
AST: 41 U/L — AB (ref 5–34)
Alkaline Phosphatase: 117 U/L (ref 40–150)
Anion Gap: 9 mEq/L (ref 3–11)
BUN: 15.5 mg/dL (ref 7.0–26.0)
CALCIUM: 10 mg/dL (ref 8.4–10.4)
CHLORIDE: 106 meq/L (ref 98–109)
CO2: 25 mEq/L (ref 22–29)
Creatinine: 0.9 mg/dL (ref 0.7–1.3)
GLUCOSE: 113 mg/dL (ref 70–140)
POTASSIUM: 3.9 meq/L (ref 3.5–5.1)
Sodium: 140 mEq/L (ref 136–145)
Total Bilirubin: 0.57 mg/dL (ref 0.20–1.20)
Total Protein: 8.1 g/dL (ref 6.4–8.3)

## 2013-09-14 MED ORDER — IOHEXOL 300 MG/ML  SOLN
125.0000 mL | Freq: Once | INTRAMUSCULAR | Status: AC | PRN
  Administered 2013-09-14: 125 mL via INTRAVENOUS

## 2013-09-20 ENCOUNTER — Encounter: Payer: Self-pay | Admitting: Oncology

## 2013-09-20 ENCOUNTER — Ambulatory Visit (HOSPITAL_BASED_OUTPATIENT_CLINIC_OR_DEPARTMENT_OTHER): Payer: Medicare Other | Admitting: Oncology

## 2013-09-20 ENCOUNTER — Telehealth: Payer: Self-pay | Admitting: Oncology

## 2013-09-20 VITALS — BP 145/81 | HR 87 | Temp 98.9°F | Resp 19 | Ht 72.0 in | Wt 214.6 lb

## 2013-09-20 DIAGNOSIS — C679 Malignant neoplasm of bladder, unspecified: Secondary | ICD-10-CM

## 2013-09-20 DIAGNOSIS — Z8546 Personal history of malignant neoplasm of prostate: Secondary | ICD-10-CM

## 2013-09-20 DIAGNOSIS — R1900 Intra-abdominal and pelvic swelling, mass and lump, unspecified site: Secondary | ICD-10-CM

## 2013-09-20 NOTE — Telephone Encounter (Signed)
gv pt appt schedule for May. °

## 2013-09-20 NOTE — Progress Notes (Signed)
Hematology and Oncology Follow Up Visit  Joe Park [id number] 06-02-1941 73 y.o. 09/20/2013 4:33 PM   Principle Diagnosis: 73 year old man diagnosed with bladder cancer in 09/2010.   Prior Therapy: He is S/P cystoprostatectomy and bilateral pelvic lymph node dissection and creation of an ileal conduit done on 11/11/2011. 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  The malignancy extends through the bladder wall into the perivesical soft tissue. The malignancy identified at the black anterior surface of he bladder. Four out of 4 lymph nodes in the right pelvis were negative and 6 out of 6 in the left pelvis were negative. The pathological staging was T3B N0.  Secondary Diagnosis: Prostate cancer diagnosed in 2002. Not active at this time.   Current therapy: Observations and Surveillance. He declined adjuvant chemotherapy.   Interim History:  [name] returns today for a follow up visit by himself today. Since his last visit, he have developed intermittent nagging pain and the Botox area and at times radiates down his leg. Is not associated with any neurological deficits not associated with any difficulty ambulating or moving his bowels. He was evaluated by his primary care physician Dr. Sharlett Iles and had an MRI of the L-spine that was unremarkable. He took some Tylenol which helped his pain at this time. He is doing well with his urostomy back. Is not reporting any other complaints at the time being he have regained most activities of daily living including excellent appetite and weight gain. No bleeding or pain noted today. He continues to live alone and have no problems functioning independently. And has not really had any recent hospitalizations or illnesses.  Medications: I have reviewed the patient's current medications. Current Outpatient Prescriptions  Medication Sig  Dispense Refill  . Chlorhexidine Gluconate (PERIOGARD MT) Use as directed 0.5 oz in the mouth or throat at bedtime. For 2 months      . aspirin 81 MG chewable tablet Chew 81 mg by mouth daily with breakfast.       . levothyroxine (SYNTHROID, LEVOTHROID) 125 MCG tablet Take 125 mcg by mouth daily with breakfast.       . lisinopril (PRINIVIL,ZESTRIL) 40 MG tablet Take 40 mg by mouth every morning.      . Multiple Vitamin (MULITIVITAMIN WITH MINERALS) TABS Take 1 tablet by mouth every morning.      . simvastatin (ZOCOR) 40 MG tablet Take 40 mg by mouth at bedtime.      . traMADol (ULTRAM) 50 MG tablet Take 1 tablet by mouth daily.      . [DISCONTINUED] hydrALAZINE (APRESOLINE) 25 MG tablet Take 12.5 mg by mouth every morning.       No current facility-administered medications for this visit.    Allergies: No Known Allergies  Past Medical History, Surgical history, Social history, and Family History were reviewed and updated.  Review of Systems:  Remaining ROS negative. Physical Exam: Blood pressure 145/81, pulse 87, temperature 98.9 F (37.2 C), temperature source Oral, resp. rate 19, height 6' (1.829 m), weight 214 lb 9.6 oz (97.342 kg), SpO2 96.00%. ECOG: 1 General appearance: alert Head: Normocephalic, without obvious abnormality, atraumatic Neck: no adenopathy, no carotid bruit, no JVD, supple, symmetrical, trachea midline and thyroid not enlarged, symmetric, no tenderness/mass/nodules Lymph nodes: Cervical, supraclavicular, and axillary nodes normal. Heart:regular rate and rhythm, S1, S2 normal, no murmur, click, rub or gallop Lung:chest clear, no wheezing, rales,  normal symmetric air entry Abdomin: soft, non-tender, without masses or organomegaly EXT:no erythema, induration, or nodules Neurological examination: No deficits at this time noted. He was able to ambulate without any difficulties  Lab Results: Lab Results  Component Value Date   WBC 7.2 09/14/2013   HGB 15.4 09/14/2013    HCT 45.9 09/14/2013   MCV 88.4 09/14/2013   PLT 229 09/14/2013     Chemistry      Component Value Date/Time   NA 140 09/14/2013 1023   NA 140 02/24/2012 1249   K 3.9 09/14/2013 1023   K 3.9 02/24/2012 1249   CL 106 12/27/2012 0801   CL 110 02/24/2012 1249   CO2 25 09/14/2013 1023   CO2 23 02/24/2012 1249   BUN 15.5 09/14/2013 1023   BUN 18 02/24/2012 1249   CREATININE 0.9 09/14/2013 1023   CREATININE 0.98 02/24/2012 1249      Component Value Date/Time   CALCIUM 10.0 09/14/2013 1023   CALCIUM 8.9 02/24/2012 1249   ALKPHOS 117 09/14/2013 1023   ALKPHOS 85 02/24/2012 1249   AST 41* 09/14/2013 1023   AST 18 02/24/2012 1249   ALT 38 09/14/2013 1023   ALT 12 02/24/2012 1249   BILITOT 0.57 09/14/2013 1023   BILITOT 0.6 02/24/2012 1249     EXAM:  CT CHEST WITH CONTRAST  CT ABDOMEN AND PELVIS WITH AND WITHOUT CONTRAST  TECHNIQUE:  Multidetector CT imaging of the chest was performed during  intravenous contrast administration. Multidetector CT imaging of the  abdomen and pelvis was performed following the standard protocol  before and during bolus administration of intravenous contrast.  CONTRAST: 139mL OMNIPAQUE IOHEXOL 300 MG/ML SOLN  COMPARISON: CT CHEST W/CM dated 12/27/2012; MR L SPINE W/O dated  08/22/2013  FINDINGS:  CT CHEST FINDINGS  Lungs/Pleura: Similar nodularity along the major fissures, including  on image 31/series 6. Focal bleb at the left apex medially.  No pleural fluid.  Heart/Mediastinum: No supraclavicular adenopathy. Normal heart size,  without pericardial effusion. No central pulmonary embolism, on this  non-dedicated study. No mediastinal or hilar adenopathy. Subtle  esophageal air-fluid level on image 31/series 4.  CT ABDOMEN AND PELVIS FINDINGS  Abdomen/Pelvis: Normal liver, spleen, stomach, pancreas,  gallbladder, biliary tract, left adrenal gland. Mild right adrenal  nodularity which is unchanged.  No renal calculi or ureteric calculi on unenhanced images. Too small  to  characterize lesions in bilateral kidneys. Left renal collecting  system wall thickening on image 78/series 4 is not significantly  changed and favored to be reactive/secondary to ileal conduit. The  ureters are well visualized throughout their course and are within  normal limits. There is increased density within the medial portion  of the ileal conduit, including on image 110/series 4. This is  likely due to surgical sutures. Delayed images demonstrate moderate  to good renal collecting system opacification bilaterally. Good  ureteric opacification without filling defect. Mild left-sided  pelvicaliectasis is unchanged. No hydroureter.  No retroperitoneal or retrocrural adenopathy. Normal colon,  appendix, and terminal ileum. Normal caliber of small bowel loops.  Right lower quadrant ileal conduit, with similar fat containing  parastomal hernia. Subtle omental nodularity just superior and  lateral to the conduit site. This measures 1.2 cm on image 78/series  4 and is new.  Fat containing right inguinal hernia. No pelvic adenopathy.  Cystectomy. Radiation seeds in the prostate. Infiltrative hypo  attenuating mass is identified about the inferior anterior aspect of  the prostate, penile base, and right-sided adductor  musculature.  Difficult to measure due to its infiltrative morphology, but on the  order of 7.6 x 7.7 cm on image 130/series 4.  Bones/Musculoskeletal: No gross osseous destruction at the site of  pelvic floor disease. Convex left lumbar spine curvature. A mild to  moderate L1 compression deformity is chronic.  IMPRESSION:  CT CHEST IMPRESSION  1. No acute process or evidence of metastatic disease in the chest.  2. Esophageal air fluid level suggests dysmotility or  gastroesophageal reflux.  CT ABDOMEN AND PELVIS IMPRESSION  1. Status post cystectomy with radiation seeds in the prostate. An  ill-defined mass within deep pelvic floor, eccentric right, is  highly  suspicious for locally recurrent bladder carcinoma. Less  likely etiologies include locally recurrent/progressive prostate  carcinoma or much less likely infection. This may be amenable to  direct physical exam and tissue sampling.  2. Status post ileal conduit creation, without acute complication.  Chronic fat containing parastomal hernia.  3. Minimal omental nodularity which is new. Cannot exclude  omental/peritoneal metastasis. Given adjacent surgical change, an  area of delayed fat necrosis is possible but felt less likely.    Impression and Plan: 73 year old with the following issue: 1. 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 CT scans on 09/14/2013 was discussed with the patient today and did not show widely metastatic disease at this time. However there is a suspicious for an infiltrative pelvic mass although of unclear etiology. I will discuss these findings with radiology to see which best way to proceed with this. Whether imaging studies such as a PET CT scan versus straight biopsy would be the next step. And depending on the consensus we will arrange for that procedure or imaging studies accordingly.  2. pelvic discomfort: This could be related to this infiltrative mass and certainly could be incidental and unrelated musculoskeletal complaints. He is not reporting any major symptoms at this time and for that reason no change in his medication is needed at this time.  3. History of prostate cancer: Not active at this time.  4. Followup: Will depend on the results of the evaluation of his pelvic mass.   Zola Button, MD 3/11/20154:33 PM

## 2013-09-21 NOTE — Progress Notes (Signed)
This is an addendum to the office visit note on 09/20/2013. I have reviewed the imaging studies with radiology which showed a rather discrete and worrisome pelvic mass that could be responsible for patient's symptoms of proximal pain. They recommended no further imaging studies but certainly we need to biopsy it to confirm the pathology at this time. I will ask for interventional radiology evaluation and possible biopsy. I discussed these with findings with the patient today over the phone and I updated him about the current plan.

## 2013-09-21 NOTE — Addendum Note (Signed)
Addended by: Wyatt Portela on: 09/21/2013 04:28 PM   Modules accepted: Orders

## 2013-09-25 ENCOUNTER — Other Ambulatory Visit: Payer: Self-pay | Admitting: Radiology

## 2013-09-26 ENCOUNTER — Ambulatory Visit (HOSPITAL_COMMUNITY)
Admission: RE | Admit: 2013-09-26 | Discharge: 2013-09-26 | Disposition: A | Discharge status: Home / Self Care | Payer: Medicare Other | Source: Ambulatory Visit | Attending: Oncology | Admitting: Oncology

## 2013-09-26 ENCOUNTER — Encounter (HOSPITAL_COMMUNITY): Payer: Self-pay

## 2013-09-26 DIAGNOSIS — K219 Gastro-esophageal reflux disease without esophagitis: Secondary | ICD-10-CM | POA: Insufficient documentation

## 2013-09-26 DIAGNOSIS — R1900 Intra-abdominal and pelvic swelling, mass and lump, unspecified site: Secondary | ICD-10-CM | POA: Insufficient documentation

## 2013-09-26 DIAGNOSIS — Z8546 Personal history of malignant neoplasm of prostate: Secondary | ICD-10-CM | POA: Insufficient documentation

## 2013-09-26 DIAGNOSIS — I10 Essential (primary) hypertension: Secondary | ICD-10-CM | POA: Insufficient documentation

## 2013-09-26 DIAGNOSIS — C679 Malignant neoplasm of bladder, unspecified: Secondary | ICD-10-CM

## 2013-09-26 DIAGNOSIS — Z79899 Other long term (current) drug therapy: Secondary | ICD-10-CM | POA: Insufficient documentation

## 2013-09-26 DIAGNOSIS — G4730 Sleep apnea, unspecified: Secondary | ICD-10-CM | POA: Insufficient documentation

## 2013-09-26 DIAGNOSIS — C495 Malignant neoplasm of connective and soft tissue of pelvis: Secondary | ICD-10-CM | POA: Insufficient documentation

## 2013-09-26 DIAGNOSIS — E7800 Pure hypercholesterolemia, unspecified: Secondary | ICD-10-CM | POA: Insufficient documentation

## 2013-09-26 DIAGNOSIS — Z8551 Personal history of malignant neoplasm of bladder: Secondary | ICD-10-CM | POA: Insufficient documentation

## 2013-09-26 LAB — PROTIME-INR
INR: 1.02 (ref 0.00–1.49)
PROTHROMBIN TIME: 13.2 s (ref 11.6–15.2)

## 2013-09-26 LAB — CBC
HCT: 41.2 % (ref 39.0–52.0)
Hemoglobin: 14.3 g/dL (ref 13.0–17.0)
MCH: 29.7 pg (ref 26.0–34.0)
MCHC: 34.7 g/dL (ref 30.0–36.0)
MCV: 85.5 fL (ref 78.0–100.0)
Platelets: 222 10*3/uL (ref 150–400)
RBC: 4.82 MIL/uL (ref 4.22–5.81)
RDW: 13.6 % (ref 11.5–15.5)
WBC: 6.4 10*3/uL (ref 4.0–10.5)

## 2013-09-26 LAB — APTT: aPTT: 28 seconds (ref 24–37)

## 2013-09-26 MED ORDER — FENTANYL CITRATE 0.05 MG/ML IJ SOLN
INTRAMUSCULAR | Status: AC | PRN
  Administered 2013-09-26: 50 ug via INTRAVENOUS
  Administered 2013-09-26: 25 ug via INTRAVENOUS

## 2013-09-26 MED ORDER — FENTANYL CITRATE 0.05 MG/ML IJ SOLN
INTRAMUSCULAR | Status: AC
  Filled 2013-09-26: qty 6

## 2013-09-26 MED ORDER — SODIUM CHLORIDE 0.9 % IV SOLN
INTRAVENOUS | Status: DC
  Administered 2013-09-26: 08:00:00 via INTRAVENOUS

## 2013-09-26 MED ORDER — MIDAZOLAM HCL 2 MG/2ML IJ SOLN
INTRAMUSCULAR | Status: AC
  Filled 2013-09-26: qty 6

## 2013-09-26 MED ORDER — MIDAZOLAM HCL 2 MG/2ML IJ SOLN
INTRAMUSCULAR | Status: AC | PRN
  Administered 2013-09-26: 1 mg via INTRAVENOUS

## 2013-09-26 NOTE — Procedures (Signed)
Technically successful CT guided biopsy of mass within the right lower hemi pelvis.    No immediate post procedural complications.

## 2013-09-26 NOTE — Discharge Instructions (Addendum)
Needle Biopsy Care After These instructions give you information on caring for yourself after your procedure. Your doctor may also give you more specific instructions. Call your doctor if you have any problems or questions after your procedure. HOME CARE  Rest for 4 hours after your biopsy, except for getting up to go to the bathroom or as told.  Keep the places where the needles were put in clean and dry.  Do not put powder or lotion on the sites.  Do not shower until 24 hours after the test. Remove all bandages (dressings) before showering.  Remove all bandages at least once every day. Gently clean the sites with soap and water. Keep putting a new bandage on until the skin is closed. Finding out the results of your test Ask your doctor when your test results will be ready. Make sure you follow up and get the test results. GET HELP RIGHT AWAY IF:   You have shortness of breath or trouble breathing.  You have pain or cramping in your belly (abdomen).  You feel sick to your stomach (nauseous) or throw up (vomit).  Any of the places where the needles were put in:  Are puffy (swollen) or red.  Are sore or hot to the touch.  Are draining yellowish-white fluid (pus).  Are bleeding after 10 minutes of pressing down on the site. Have someone keep pressing on any place that is bleeding until you see a doctor.  You have any unusual pain that will not stop.  You have a fever. If you go to the emergency room, tell the nurse that you had a biopsy. Take this paper with you to show the nurse. MAKE SURE YOU:   Understand these instructions.  Will watch your condition.  Will get help right away if you are not doing well or get worse. Document Released: 06/11/2008 Document Revised: 09/21/2011 Document Reviewed: 06/11/2008 [facility] Patient Information 2014 [place]. Conscious Sedation, Adult, Care After Refer to this sheet in the next few weeks. These instructions provide you  with information on caring for yourself after your procedure. Your health care provider may also give you more specific instructions. Your treatment has been planned according to current medical practices, but problems sometimes occur. Call your health care provider if you have any problems or questions after your procedure. WHAT TO EXPECT AFTER THE PROCEDURE  After your procedure:  You may feel sleepy, clumsy, and have poor balance for several hours.  Vomiting may occur if you eat too soon after the procedure. HOME CARE INSTRUCTIONS  Do not participate in any activities where you could become injured for at least 24 hours. Do not:  Drive.  Swim.  Ride a bicycle.  Operate heavy machinery.  Cook.  Use power tools.  Climb ladders.  Work from a high place.  Do not make important decisions or sign legal documents until you are improved.  If you vomit, drink water, juice, or soup when you can drink without vomiting. Make sure you have little or no nausea before eating solid foods.  Only take over-the-counter or prescription medicines for pain, discomfort, or fever as directed by your health care provider.  Make sure you and your family fully understand everything about the medicines given to you, including what side effects may occur.  You should not drink alcohol, take sleeping pills, or take medicines that cause drowsiness for at least 24 hours.  If you smoke, do not smoke without supervision.  If you are feeling better,  you may resume normal activities 24 hours after you were sedated.  Keep all appointments with your health care provider. SEEK MEDICAL CARE IF:  Your skin is pale or bluish in color.  You continue to feel nauseous or vomit.  Your pain is getting worse and is not helped by medicine.  You have bleeding or swelling.  You are still sleepy or feeling clumsy after 24 hours. SEEK IMMEDIATE MEDICAL CARE IF:  You develop a rash.  You have difficulty  breathing.  You develop any type of allergic problem.  You have a fever. MAKE SURE YOU:  Understand these instructions.  Will watch your condition.  Will get help right away if you are not doing well or get worse. Document Released: 04/19/2013 Document Reviewed: 02/03/2013 [facility] Patient Information 2014 [place], [place].  Conscious Sedation, Adult, Care After Refer to this sheet in the next few weeks. These instructions provide you with information on caring for yourself after your procedure. Your health care provider may also give you more specific instructions. Your treatment has been planned according to current medical practices, but problems sometimes occur. Call your health care provider if you have any problems or questions after your procedure. WHAT TO EXPECT AFTER THE PROCEDURE  After your procedure:  You may feel sleepy, clumsy, and have poor balance for several hours.  Vomiting may occur if you eat too soon after the procedure. HOME CARE INSTRUCTIONS  Do not participate in any activities where you could become injured for at least 24 hours. Do not:  Drive.  Swim.  Ride a bicycle.  Operate heavy machinery.  Cook.  Use power tools.  Climb ladders.  Work from a high place.  Do not make important decisions or sign legal documents until you are improved.  If you vomit, drink water, juice, or soup when you can drink without vomiting. Make sure you have little or no nausea before eating solid foods.  Only take over-the-counter or prescription medicines for pain, discomfort, or fever as directed by your health care provider.  Make sure you and your family fully understand everything about the medicines given to you, including what side effects may occur.  You should not drink alcohol, take sleeping pills, or take medicines that cause drowsiness for at least 24 hours.  If you smoke, do not smoke without supervision.  If you are feeling better, you may resume  normal activities 24 hours after you were sedated.  Keep all appointments with your health care provider. SEEK MEDICAL CARE IF:  Your skin is pale or bluish in color.  You continue to feel nauseous or vomit.  Your pain is getting worse and is not helped by medicine.  You have bleeding or swelling.  You are still sleepy or feeling clumsy after 24 hours. SEEK IMMEDIATE MEDICAL CARE IF:  You develop a rash.  You have difficulty breathing.  You develop any type of allergic problem.  You have a fever. MAKE SURE YOU:  Understand these instructions.  Will watch your condition.  Will get help right away if you are not doing well or get worse. Document Released: 04/19/2013 Document Reviewed: 02/03/2013 [facility] Patient Information 2014 [place], [place].

## 2013-09-26 NOTE — H&P (Signed)
Joe Park is an 73 y.o. male.   Chief Complaint: pelvic mass HPI: Patient with history of prostate/ bladder carcinoma, low back pain and recent CT revealing an ill defined mass in deep pelvic floor presents today for CT guided pelvic mass biopsy.  Past Medical History  Diagnosis Date  . Hypertension   . Elevated cholesterol   . GERD (gastroesophageal reflux disease)   . Bladder tumor   . Difficulty urinating   . Hypothyroidism     UNSURE IF TOO LOW OR TOO HIGH  . Difficulty sleeping   . Speech impediment     SINCE BIRTH  . Sleep apnea     STOP BANG SCORE 5  . Cancer     HX PROSTATE CANCER AND BLADDER CANCER    Past Surgical History  Procedure Laterality Date  . Cataracts  2012    RT CATARACT REMOVED  . Radioactive seed implant  1990'S    PROSTATE CANCER  . Hernia repair  1980'S  . Transurethral resection of bladder tumor  09/22/2011    Procedure: TRANSURETHRAL RESECTION OF BLADDER TUMOR (TURBT);  Surgeon: Malka So, MD;  Location: [facility];  Service: Urology;  Laterality: N/A;  Examination Under Anesthesia/TUR-BT with Gyrus  . Cystectomy  11/11/2011    Procedure: CYSTECTOMY COMPLETE;  Surgeon: Franchot Gallo, MD;  Location: [facility];  Service: Urology;  Laterality: N/A;  RADICAL CYSTECTOMY WITH NODE DISSECTION AND ILEAL CONDUIT   . Lymph node dissection  11/11/2011    Procedure: LYMPH NODE DISSECTION;  Surgeon: Franchot Gallo, MD;  Location: [facility];  Service: Urology;  Laterality: N/A;    History reviewed. No pertinent family history. Social History:  reports that he has never smoked. He has never used smokeless tobacco. He reports that he does not drink alcohol or use illicit drugs.  Allergies: No Known Allergies  Current outpatient prescriptions:aspirin 81 MG chewable tablet, Chew 81 mg by mouth daily with breakfast. , Disp: , Rfl: ;  levothyroxine (SYNTHROID, LEVOTHROID) 125 MCG tablet, Take 125 mcg by mouth daily with breakfast. , Disp: , Rfl: ;  lisinopril  (PRINIVIL,ZESTRIL) 40 MG tablet, Take 40 mg by mouth every morning., Disp: , Rfl: ;  Multiple Vitamin (MULITIVITAMIN WITH MINERALS) TABS, Take 1 tablet by mouth every morning., Disp: , Rfl:  simvastatin (ZOCOR) 40 MG tablet, Take 40 mg by mouth at bedtime., Disp: , Rfl: ;  traMADol (ULTRAM) 50 MG tablet, Take 1 tablet by mouth daily., Disp: , Rfl: ;  Chlorhexidine Gluconate (PERIOGARD MT), Use as directed 0.5 oz in the mouth or throat at bedtime. For 2 months, Disp: , Rfl: ;  [DISCONTINUED] hydrALAZINE (APRESOLINE) 25 MG tablet, Take 12.5 mg by mouth every morning., Disp: , Rfl:  Current facility-administered medications:0.9 %  sodium chloride infusion, , Intravenous, Continuous, Ascencion Dike, PA-C, Last Rate: 20 mL/hr at 09/26/13 0735   Results for orders placed during the hospital encounter of 09/26/13 (from the past 48 hour(s))  APTT     Status: None   Collection Time    09/26/13  7:30 AM      Result Value Ref Range   aPTT 28  24 - 37 seconds  CBC     Status: None   Collection Time    09/26/13  7:30 AM      Result Value Ref Range   WBC 6.4  4.0 - 10.5 K/uL   RBC 4.82  4.22 - 5.81 MIL/uL   Hemoglobin 14.3  13.0 - 17.0 g/dL  HCT 41.2  39.0 - 52.0 %   MCV 85.5  78.0 - 100.0 fL   MCH 29.7  26.0 - 34.0 pg   MCHC 34.7  30.0 - 36.0 g/dL   RDW 13.6  11.5 - 15.5 %   Platelets 222  150 - 400 K/uL  PROTIME-INR     Status: None   Collection Time    09/26/13  7:30 AM      Result Value Ref Range   Prothrombin Time 13.2  11.6 - 15.2 seconds   INR 1.02  0.00 - 1.49   No results found.  Review of Systems  Constitutional: Negative for fever and chills.  Respiratory: Negative for cough and shortness of breath.   Cardiovascular: Negative for chest pain.  Gastrointestinal: Negative for nausea, vomiting and abdominal pain.  Genitourinary: Negative for hematuria.  Musculoskeletal: Positive for back pain.  Endo/Heme/Allergies: Does not bruise/bleed easily.    Blood pressure 138/80, pulse 70,  temperature 97.4 F (36.3 C), temperature source Oral, resp. rate 18, height 6' (1.829 m), weight 214 lb (97.07 kg), SpO2 99.00%. Physical Exam  Constitutional: He is oriented to person, place, and time. He appears well-developed and well-nourished.  Cardiovascular: Normal rate and regular rhythm.   Respiratory: Effort normal.  Few rt basilar crackles, left clear  GI: Soft. Bowel sounds are normal. There is no tenderness.  Ileal conduit present with yellow urine in bag  Musculoskeletal: Normal range of motion. He exhibits no edema.  Neurological: He is alert and oriented to person, place, and time.     Assessment/Plan Patient with history of prostate/ bladder carcinoma, low back pain and recent CT revealing an ill defined mass in deep pelvic floor presents today for CT guided pelvic mass biopsy. Details/risks of procedure d/w pt/brother with their understanding and consent.  Davetta Olliff,D KEVIN 09/26/2013, 8:47 AM

## 2013-09-29 ENCOUNTER — Telehealth: Payer: Self-pay | Admitting: Medical Oncology

## 2013-09-29 NOTE — Telephone Encounter (Signed)
VMOM from patient requesting test results from 03/17 procedure. Review with MD and informed patient, LVMOM, that test has not been resulted and as soon as Dr. Alen Blew has the results we will call him. Patient to call office with questions or concerns.

## 2013-10-02 ENCOUNTER — Telehealth: Payer: Self-pay | Admitting: Oncology

## 2013-10-02 NOTE — Telephone Encounter (Signed)
s.w. pt and advised 3.25.15 appt....pt ok and aware

## 2013-10-04 ENCOUNTER — Encounter: Payer: Self-pay | Admitting: Oncology

## 2013-10-04 ENCOUNTER — Ambulatory Visit (HOSPITAL_BASED_OUTPATIENT_CLINIC_OR_DEPARTMENT_OTHER): Payer: Medicare Other | Admitting: Oncology

## 2013-10-04 VITALS — BP 164/78 | HR 70 | Temp 97.3°F | Resp 18 | Ht 72.0 in | Wt 213.3 lb

## 2013-10-04 DIAGNOSIS — C679 Malignant neoplasm of bladder, unspecified: Secondary | ICD-10-CM

## 2013-10-04 DIAGNOSIS — M79609 Pain in unspecified limb: Secondary | ICD-10-CM

## 2013-10-04 NOTE — Progress Notes (Signed)
Hematology and Oncology Follow Up Visit  Joe Park [id number] 02-18-1941 73 y.o. 10/04/2013 9:59 AM   Principle Diagnosis: 73 year old man diagnosed with bladder cancer in 09/2010.   Prior Therapy: He is S/P cystoprostatectomy and bilateral pelvic lymph node dissection and creation of an ileal conduit done on 11/11/2011. 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  The malignancy extends through the bladder wall into the perivesical soft tissue. The malignancy identified at the black anterior surface of he bladder. Four out of 4 lymph nodes in the right pelvis were negative and 6 out of 6 in the left pelvis were negative. The pathological staging was T3B N0.  Secondary Diagnosis: Prostate cancer diagnosed in 2002. Not active at this time.   Current therapy: Evaluation before the start of systemic chemotherapy.   Interim History:  Joe Park returns today for a follow up visit. Since his last visit, he underwent a CT scan as discussed before and also a biopsy of a pelvic mass that is suspicious for recurrent disease. He have developed intermittent nagging pain in the pelvic area and at times radiates down his leg. Is not associated with any neurological deficits not associated with any difficulty ambulating or moving his bowels. He is reporting his pain is significantly improved at this time but still feels that intermittently. He has not really had any recent hospitalizations or illnesses. He tolerated the biopsy without any complications. His review of system otherwise did not report any headaches blurred vision double vision without any change in his double of consciousness. Is not reporting any chest pain shortness of breath difficulty breathing. Stent put any cough or hemoptysis or hematemesis. Reported nausea vomiting abdominal pain no hematochezia or melena. Has not reported  any genitourinary complaints. Spent 20 musculoskeletal complaints. Does not report any petechiae easy bruising or lymphadenopathy.  Medications: I have reviewed the patient's current medications. Current Outpatient Prescriptions  Medication Sig Dispense Refill  . aspirin 81 MG chewable tablet Chew 81 mg by mouth daily with breakfast.       . Chlorhexidine Gluconate (PERIOGARD MT) Use as directed 0.5 oz in the mouth or throat at bedtime. For 2 months      . levothyroxine (SYNTHROID, LEVOTHROID) 125 MCG tablet Take 125 mcg by mouth daily with breakfast.       . lisinopril (PRINIVIL,ZESTRIL) 40 MG tablet Take 40 mg by mouth every morning.      . Multiple Vitamin (MULITIVITAMIN WITH MINERALS) TABS Take 1 tablet by mouth every morning.      . simvastatin (ZOCOR) 40 MG tablet Take 40 mg by mouth at bedtime.      . traMADol (ULTRAM) 50 MG tablet Take 1 tablet by mouth daily.      . [DISCONTINUED] hydrALAZINE (APRESOLINE) 25 MG tablet Take 12.5 mg by mouth every morning.       No current facility-administered medications for this visit.    Allergies: No Known Allergies  Past Medical History, Surgical history, Social history, and Family History were reviewed and updated.  Review of Systems:  Remaining ROS negative.  Physical Exam: Blood pressure 164/78, pulse 70, temperature 97.3 F (36.3 C), temperature source Oral, resp. rate 18, height 6' (1.829 m), weight 213 lb 4.8 oz (96.752 kg), SpO2 98.00%. ECOG: 1 General appearance: alert Head: Normocephalic, without obvious abnormality, atraumatic Neck: no adenopathy, no carotid bruit, no JVD, supple, symmetrical, trachea midline and  thyroid not enlarged, symmetric, no tenderness/mass/nodules Lymph nodes: Cervical, supraclavicular, and axillary nodes normal. Heart:regular rate and rhythm, S1, S2 normal, no murmur, click, rub or gallop Lung:chest clear, no wheezing, rales, normal symmetric air entry Abdomin: soft, non-tender, without masses or  organomegaly EXT:no erythema, induration, or nodules Neurological examination: No deficits at this time noted. He was able to ambulate without any difficulties  Lab Results: Lab Results  Component Value Date   WBC 6.4 09/26/2013   HGB 14.3 09/26/2013   HCT 41.2 09/26/2013   MCV 85.5 09/26/2013   PLT 222 09/26/2013     Chemistry      Component Value Date/Time   NA 140 09/14/2013 1023   NA 140 02/24/2012 1249   K 3.9 09/14/2013 1023   K 3.9 02/24/2012 1249   CL 106 12/27/2012 0801   CL 110 02/24/2012 1249   CO2 25 09/14/2013 1023   CO2 23 02/24/2012 1249   BUN 15.5 09/14/2013 1023   BUN 18 02/24/2012 1249   CREATININE 0.9 09/14/2013 1023   CREATININE 0.98 02/24/2012 1249      Component Value Date/Time   CALCIUM 10.0 09/14/2013 1023   CALCIUM 8.9 02/24/2012 1249   ALKPHOS 117 09/14/2013 1023   ALKPHOS 85 02/24/2012 1249   AST 41* 09/14/2013 1023   AST 18 02/24/2012 1249   ALT 38 09/14/2013 1023   ALT 12 02/24/2012 1249   BILITOT 0.57 09/14/2013 1023   BILITOT 0.6 02/24/2012 1249      Soft tissue mass, biopsy, 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 - PLEASE SEE COMMENT.   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 CT scans on 09/14/2013 was discussed with the patient today and his family that accompanied him today again. The results of the pathology was also discussed which showed recurrent cancer from his bladder which has a lot of sarcomatoid features. The natural course of this disease was discussed extensively which is known to be a rather aggressive and fast growing. This can lead to complications locally with pain and discomfort and possible neurological deficits. Could also developed metastatic disease. I do not see any role for surgical resection given the size and location of the tumor, I do not see a radiation therapy be an option given his previous history of radiation that  leaves systemic chemotherapy is the only option. These tumors are not exactly very chemosensitive but it is reasonable to try given his good performance status. Combination of chemotherapy with carboplatin and Gemzar were discussed today. I feel this regimen would be tolerable with some reasonable success. Complications that includes nausea, vomiting, myelosuppression, infusion-related toxicity, neutropenia, neutropenic sepsis and possible need for hospitalizations were all discussed. Other complications that includes renal insufficiency and electrolyte imbalance as well as VTE were all discussed. He is agreeable to proceed and an informed verbal consent were obtained today. However firm to chemotherapy education class as well. Chemotherapy will be given on day 1 with combination of 2 drugs as well as date 8 with Gemzar out of a 21 day cycle  2. Pelvic discomfort: This could be related to this infiltrative mass and certainly could be incidental and unrelated musculoskeletal complaints. He is not reporting any major symptoms at this time and for that reason no change in his medication is needed at this time.  3. Anti-emetics: Compazine prescription was called in electronically for him today.  4. Followup: I anticipate the start of  systemic chemotherapy around April 3 of 2015 and he will be evaluated on 10/20/2013 before her day 8 of cycle 1.   [facility], MD 3/25/20159:59 AM

## 2013-10-06 ENCOUNTER — Telehealth: Payer: Self-pay | Admitting: *Deleted

## 2013-10-06 NOTE — Telephone Encounter (Signed)
Per staff message and POF I have scheduled appts.  JMW  

## 2013-10-09 ENCOUNTER — Telehealth: Payer: Self-pay | Admitting: Oncology

## 2013-10-10 ENCOUNTER — Other Ambulatory Visit: Payer: Medicare Other

## 2013-10-10 ENCOUNTER — Encounter: Payer: Self-pay | Admitting: *Deleted

## 2013-10-13 ENCOUNTER — Other Ambulatory Visit: Payer: Self-pay | Admitting: Medical Oncology

## 2013-10-13 ENCOUNTER — Ambulatory Visit (HOSPITAL_BASED_OUTPATIENT_CLINIC_OR_DEPARTMENT_OTHER): Payer: Medicare Other

## 2013-10-13 ENCOUNTER — Other Ambulatory Visit: Payer: Self-pay | Admitting: Oncology

## 2013-10-13 VITALS — BP 145/80 | HR 61 | Temp 97.4°F | Resp 18

## 2013-10-13 DIAGNOSIS — C679 Malignant neoplasm of bladder, unspecified: Secondary | ICD-10-CM

## 2013-10-13 DIAGNOSIS — Z5111 Encounter for antineoplastic chemotherapy: Secondary | ICD-10-CM

## 2013-10-13 LAB — CBC WITH DIFFERENTIAL/PLATELET
BASO%: 0.2 % (ref 0.0–2.0)
BASOS ABS: 0 10*3/uL (ref 0.0–0.1)
EOS ABS: 0.1 10*3/uL (ref 0.0–0.5)
EOS%: 1.5 % (ref 0.0–7.0)
HCT: 42.6 % (ref 38.4–49.9)
HEMOGLOBIN: 14.7 g/dL (ref 13.0–17.1)
LYMPH#: 1.5 10*3/uL (ref 0.9–3.3)
LYMPH%: 24.4 % (ref 14.0–49.0)
MCH: 29.1 pg (ref 27.2–33.4)
MCHC: 34.5 g/dL (ref 32.0–36.0)
MCV: 84.2 fL (ref 79.3–98.0)
MONO#: 0.8 10*3/uL (ref 0.1–0.9)
MONO%: 13.8 % (ref 0.0–14.0)
NEUT%: 60.1 % (ref 39.0–75.0)
NEUTROS ABS: 3.7 10*3/uL (ref 1.5–6.5)
Platelets: 194 10*3/uL (ref 140–400)
RBC: 5.06 10*6/uL (ref 4.20–5.82)
RDW: 13.7 % (ref 11.0–14.6)
WBC: 6.1 10*3/uL (ref 4.0–10.3)
nRBC: 0 % (ref 0–0)

## 2013-10-13 LAB — COMPREHENSIVE METABOLIC PANEL (CC13)
ALK PHOS: 129 U/L (ref 40–150)
ALT: 17 U/L (ref 0–55)
ANION GAP: 8 meq/L (ref 3–11)
AST: 23 U/L (ref 5–34)
Albumin: 3.1 g/dL — ABNORMAL LOW (ref 3.5–5.0)
BUN: 16.2 mg/dL (ref 7.0–26.0)
CO2: 24 meq/L (ref 22–29)
Calcium: 9.1 mg/dL (ref 8.4–10.4)
Chloride: 103 mEq/L (ref 98–109)
Creatinine: 0.9 mg/dL (ref 0.7–1.3)
GLUCOSE: 106 mg/dL (ref 70–140)
POTASSIUM: 3.6 meq/L (ref 3.5–5.1)
SODIUM: 136 meq/L (ref 136–145)
TOTAL PROTEIN: 7.6 g/dL (ref 6.4–8.3)
Total Bilirubin: 0.67 mg/dL (ref 0.20–1.20)

## 2013-10-13 MED ORDER — ACETAMINOPHEN 500 MG PO TABS
ORAL_TABLET | ORAL | Status: AC
  Filled 2013-10-13: qty 2

## 2013-10-13 MED ORDER — DEXAMETHASONE SODIUM PHOSPHATE 20 MG/5ML IJ SOLN
INTRAMUSCULAR | Status: AC
  Filled 2013-10-13: qty 5

## 2013-10-13 MED ORDER — SODIUM CHLORIDE 0.9 % IV SOLN
1000.0000 mg/m2 | Freq: Once | INTRAVENOUS | Status: AC
  Administered 2013-10-13: 2204 mg via INTRAVENOUS
  Filled 2013-10-13: qty 58

## 2013-10-13 MED ORDER — DEXAMETHASONE SODIUM PHOSPHATE 20 MG/5ML IJ SOLN
20.0000 mg | Freq: Once | INTRAMUSCULAR | Status: AC
  Administered 2013-10-13: 20 mg via INTRAVENOUS

## 2013-10-13 MED ORDER — PROCHLORPERAZINE MALEATE 10 MG PO TABS: 10.0000 mg | ORAL_TABLET | Freq: Four times a day (QID) | ORAL | Status: AC | PRN

## 2013-10-13 MED ORDER — ACETAMINOPHEN 500 MG PO TABS
1000.0000 mg | ORAL_TABLET | Freq: Once | ORAL | Status: AC
  Administered 2013-10-13: 1000 mg via ORAL

## 2013-10-13 MED ORDER — ONDANSETRON 16 MG/50ML IVPB (CHCC)
16.0000 mg | Freq: Once | INTRAVENOUS | Status: AC
  Administered 2013-10-13: 16 mg via INTRAVENOUS

## 2013-10-13 MED ORDER — SODIUM CHLORIDE 0.9 % IV SOLN
Freq: Once | INTRAVENOUS | Status: AC
  Administered 2013-10-13: 09:00:00 via INTRAVENOUS

## 2013-10-13 MED ORDER — SODIUM CHLORIDE 0.9 % IV SOLN
582.0000 mg | Freq: Once | INTRAVENOUS | Status: AC
  Administered 2013-10-13: 580 mg via INTRAVENOUS
  Filled 2013-10-13: qty 58

## 2013-10-13 MED ORDER — ONDANSETRON 16 MG/50ML IVPB (CHCC)
INTRAVENOUS | Status: AC
  Filled 2013-10-13: qty 16

## 2013-10-13 NOTE — Patient Instructions (Signed)
[facility] Discharge Instructions for Patients Receiving Chemotherapy  Today you received the following chemotherapy agents: Gemzar, Carboplatin   To help prevent nausea and vomiting after your treatment, we encourage you to take your nausea medication as prescribed.    If you develop nausea and vomiting that is not controlled by your nausea medication, call the clinic.   BELOW ARE SYMPTOMS THAT SHOULD BE REPORTED IMMEDIATELY:  *FEVER GREATER THAN 100.5 F  *CHILLS WITH OR WITHOUT FEVER  NAUSEA AND VOMITING THAT IS NOT CONTROLLED WITH YOUR NAUSEA MEDICATION  *UNUSUAL SHORTNESS OF BREATH  *UNUSUAL BRUISING OR BLEEDING  TENDERNESS IN MOUTH AND THROAT WITH OR WITHOUT PRESENCE OF ULCERS  *URINARY PROBLEMS  *BOWEL PROBLEMS  UNUSUAL RASH Items with * indicate a potential emergency and should be followed up as soon as possible.  Feel free to call the clinic you have any questions or concerns. The clinic phone number is [phone].    

## 2013-10-13 NOTE — Progress Notes (Signed)
Patient completed D1,C1 gemzar and carboplatin without any complications. Notified patient that Dr.Shadad's nurse called in a prescription for compazine to his pharmacy and for him to pick it up on the way home. Patient and patient's brother verbalized understanding of this. Chemo follow-up call placed and patient told to call with any questions at all. Harvie Bridge

## 2013-10-16 ENCOUNTER — Telehealth: Payer: Self-pay | Admitting: *Deleted

## 2013-10-16 ENCOUNTER — Other Ambulatory Visit: Payer: Self-pay

## 2013-10-16 NOTE — Telephone Encounter (Signed)
Message copied by Cherylynn Ridges on Mon Oct 16, 2013  2:48 PM ------      Message from: Christa See      Created: Fri Oct 13, 2013 11:54 AM      Regarding: 1st time chemo f/u call      Contact: [phone]       Pt had first time gemzar, carboplatin on Friday. MD is Shadad. Can you call and see how he is doing & if he picked up compazine rx on Friday after leaving [place]? Pt seems to have lower education level/understanding fyi so the simpler the better. Thanks! Kristen  ------

## 2013-10-16 NOTE — Telephone Encounter (Signed)
Called Shirlee Limerick for chemotherapy F/U.  Patient is doing well.  Experienced a  Little nausea has taken compazine and denies vomiting.  Has a new side effects or symptom of rt. Thigh pain that when seated he is fine but when he tries to walk he walks with a limp.  Has ultram for pain but still limps.  Bowels moved well today.  Bladder is functioning well.  Eating smaller amounts and would like to get his appetite back.  Suggested he drink Boost, Ensure.  Asked if he can add Gatorade.  Instructed to drink 64 oz minimum daily of water and yes to add any sports drink or at least the day before, of and after treatment.  Denies further questions at this time and encouraged to call if needed.  Reviewed how to call after hours in the case of an emergency.

## 2013-10-20 ENCOUNTER — Ambulatory Visit (HOSPITAL_BASED_OUTPATIENT_CLINIC_OR_DEPARTMENT_OTHER): Payer: Medicare Other

## 2013-10-20 ENCOUNTER — Encounter: Payer: Self-pay | Admitting: Physician Assistant

## 2013-10-20 ENCOUNTER — Telehealth: Payer: Self-pay | Admitting: Oncology

## 2013-10-20 ENCOUNTER — Other Ambulatory Visit (HOSPITAL_BASED_OUTPATIENT_CLINIC_OR_DEPARTMENT_OTHER): Payer: Medicare Other

## 2013-10-20 ENCOUNTER — Ambulatory Visit (HOSPITAL_BASED_OUTPATIENT_CLINIC_OR_DEPARTMENT_OTHER): Payer: Medicare Other | Admitting: Physician Assistant

## 2013-10-20 VITALS — BP 152/80 | HR 80 | Temp 97.5°F | Resp 20 | Ht 72.0 in | Wt 207.0 lb

## 2013-10-20 DIAGNOSIS — Z8546 Personal history of malignant neoplasm of prostate: Secondary | ICD-10-CM

## 2013-10-20 DIAGNOSIS — C679 Malignant neoplasm of bladder, unspecified: Secondary | ICD-10-CM

## 2013-10-20 DIAGNOSIS — Z5111 Encounter for antineoplastic chemotherapy: Secondary | ICD-10-CM

## 2013-10-20 LAB — CBC WITH DIFFERENTIAL/PLATELET
BASO%: 1.1 % (ref 0.0–2.0)
Basophils Absolute: 0 10*3/uL (ref 0.0–0.1)
EOS%: 0.4 % (ref 0.0–7.0)
Eosinophils Absolute: 0 10*3/uL (ref 0.0–0.5)
HEMATOCRIT: 39.5 % (ref 38.4–49.9)
HGB: 13.2 g/dL (ref 13.0–17.1)
LYMPH%: 39.8 % (ref 14.0–49.0)
MCH: 28.9 pg (ref 27.2–33.4)
MCHC: 33.6 g/dL (ref 32.0–36.0)
MCV: 86.1 fL (ref 79.3–98.0)
MONO#: 0.1 10*3/uL (ref 0.1–0.9)
MONO%: 5.6 % (ref 0.0–14.0)
NEUT#: 1.3 10*3/uL — ABNORMAL LOW (ref 1.5–6.5)
NEUT%: 53.1 % (ref 39.0–75.0)
PLATELETS: 180 10*3/uL (ref 140–400)
RBC: 4.59 10*6/uL (ref 4.20–5.82)
RDW: 13.8 % (ref 11.0–14.6)
WBC: 2.5 10*3/uL — ABNORMAL LOW (ref 4.0–10.3)
lymph#: 1 10*3/uL (ref 0.9–3.3)

## 2013-10-20 LAB — COMPREHENSIVE METABOLIC PANEL (CC13)
ALK PHOS: 120 U/L (ref 40–150)
ALT: 28 U/L (ref 0–55)
AST: 28 U/L (ref 5–34)
Albumin: 3.2 g/dL — ABNORMAL LOW (ref 3.5–5.0)
Anion Gap: 8 mEq/L (ref 3–11)
BILIRUBIN TOTAL: 1 mg/dL (ref 0.20–1.20)
BUN: 27.2 mg/dL — ABNORMAL HIGH (ref 7.0–26.0)
CO2: 24 mEq/L (ref 22–29)
CREATININE: 1.1 mg/dL (ref 0.7–1.3)
Calcium: 9.3 mg/dL (ref 8.4–10.4)
Chloride: 103 mEq/L (ref 98–109)
Glucose: 149 mg/dl — ABNORMAL HIGH (ref 70–140)
Potassium: 3.8 mEq/L (ref 3.5–5.1)
Sodium: 135 mEq/L — ABNORMAL LOW (ref 136–145)
TOTAL PROTEIN: 7.3 g/dL (ref 6.4–8.3)

## 2013-10-20 MED ORDER — SODIUM CHLORIDE 0.9 % IV SOLN
1000.0000 mg/m2 | Freq: Once | INTRAVENOUS | Status: AC
  Administered 2013-10-20: 2204 mg via INTRAVENOUS
  Filled 2013-10-20: qty 57.97

## 2013-10-20 MED ORDER — PROCHLORPERAZINE MALEATE 10 MG PO TABS
10.0000 mg | ORAL_TABLET | Freq: Once | ORAL | Status: AC
  Administered 2013-10-20: 10 mg via ORAL

## 2013-10-20 MED ORDER — SODIUM CHLORIDE 0.9 % IV SOLN
Freq: Once | INTRAVENOUS | Status: AC
  Administered 2013-10-20: 11:00:00 via INTRAVENOUS

## 2013-10-20 MED ORDER — PROCHLORPERAZINE MALEATE 10 MG PO TABS
ORAL_TABLET | ORAL | Status: AC
  Filled 2013-10-20: qty 1

## 2013-10-20 NOTE — Telephone Encounter (Signed)
gvj ad printed aptp sched and avs for pt for April and May.Marland KitchenMarland Kitchen

## 2013-10-20 NOTE — Patient Instructions (Signed)
[facility] Discharge Instructions for Patients Receiving Chemotherapy  Today you received the following chemotherapy agent Gemzar.  To help prevent nausea and vomiting after your treatment, we encourage you to take your nausea medication.   If you develop nausea and vomiting that is not controlled by your nausea medication, call the clinic.   BELOW ARE SYMPTOMS THAT SHOULD BE REPORTED IMMEDIATELY:  *FEVER GREATER THAN 100.5 F  *CHILLS WITH OR WITHOUT FEVER  NAUSEA AND VOMITING THAT IS NOT CONTROLLED WITH YOUR NAUSEA MEDICATION  *UNUSUAL SHORTNESS OF BREATH  *UNUSUAL BRUISING OR BLEEDING  TENDERNESS IN MOUTH AND THROAT WITH OR WITHOUT PRESENCE OF ULCERS  *URINARY PROBLEMS  *BOWEL PROBLEMS  UNUSUAL RASH Items with * indicate a potential emergency and should be followed up as soon as possible.  Feel free to call the clinic you have any questions or concerns. The clinic phone number is [phone].    

## 2013-10-20 NOTE — Progress Notes (Signed)
Hematology and Oncology Follow Up Visit  Joe ERICSSON [id number] October 31, 1940 73 y.o. 10/20/2013 3:51 PM   Principle Diagnosis: 73 year old man diagnosed with bladder cancer in 09/2010.   Prior Therapy: He is S/P cystoprostatectomy and bilateral pelvic lymph node dissection and creation of an ileal conduit done on 11/11/2011. 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  The malignancy extends through the bladder wall into the perivesical soft tissue. The malignancy identified at the black anterior surface of he bladder. Four out of 4 lymph nodes in the right pelvis were negative and 6 out of 6 in the left pelvis were negative. The pathological staging was T3B N0.  Secondary Diagnosis: Prostate cancer diagnosed in 2002. Not active at this time.   Current therapy: Systemic chemotherapy with carboplatin for an AUC of 5 given on day 1 and gemcitabine at 1000 mg per meter square given on days 1 and 8 every 3 weeks, status post 1 cycle  Interim History:  Mr. Silber returns today for a follow up visit prior to proceeding with day 8 of cycle 1 of his systemic chemotherapy with carboplatin and gemcitabine. Overall he tolerated day 1 of cycle 1 relatively well with the exception of some nausea. This is well controlled with his current antiemetic. He does report that the right hip pain that he was having is now a 5-6 on a 0-10 scale. He took tramadol which decreased it to a 0-2 level. He voiced no other specific complaints. Denies any fever chills, no nausea vomiting or night sweats. Denied any significant weight loss.  Denies cough or hemoptysis or hematemesis. Denies abdominal pain no hematochezia or melena. Has not reported any genitourinary complaints.   Medications: I have reviewed the patient's current medications. Current Outpatient Prescriptions  Medication Sig Dispense Refill  .  aspirin 81 MG chewable tablet Chew 81 mg by mouth daily with breakfast.       . Chlorhexidine Gluconate (PERIOGARD MT) Use as directed 0.5 oz in the mouth or throat at bedtime. For 2 months      . levothyroxine (SYNTHROID, LEVOTHROID) 125 MCG tablet Take 125 mcg by mouth daily with breakfast.       . lisinopril (PRINIVIL,ZESTRIL) 40 MG tablet Take 40 mg by mouth every morning.      . Multiple Vitamin (MULITIVITAMIN WITH MINERALS) TABS Take 1 tablet by mouth every morning.      . prochlorperazine (COMPAZINE) 10 MG tablet Take 1 tablet (10 mg total) by mouth every 6 (six) hours as needed for nausea or vomiting.  60 tablet  1  . simvastatin (ZOCOR) 40 MG tablet Take 40 mg by mouth at bedtime.      . traMADol (ULTRAM) 50 MG tablet Take 1 tablet by mouth daily.      . [DISCONTINUED] hydrALAZINE (APRESOLINE) 25 MG tablet Take 12.5 mg by mouth every morning.       No current facility-administered medications for this visit.    Allergies: No Known Allergies  Past Medical History, Surgical history, Social history, and Family History were reviewed and updated.  Review of Systems:  Remaining ROS negative.  Physical Exam: Blood pressure 152/80, pulse 80, temperature 97.5 F (36.4 C), temperature source Oral, resp. rate 20, height 6' (1.829 m), weight 207 lb (93.895 kg), SpO2 98.00%. ECOG: 1 General appearance: alert Head: Normocephalic, without obvious abnormality, atraumatic Neck: no adenopathy, no carotid bruit, no JVD,  supple, symmetrical, trachea midline and thyroid not enlarged, symmetric, no tenderness/mass/nodules Lymph nodes: Cervical, supraclavicular, and axillary nodes normal. Heart:regular rate and rhythm, S1, S2 normal, no murmur, click, rub or gallop Lung:chest clear, no wheezing, rales, normal symmetric air entry Abdomin: soft, non-tender, without masses or organomegaly EXT:no erythema, induration, or nodules Neurological examination: No deficits at this time noted. He was able to  ambulate without any difficulties  Lab Results: Lab Results  Component Value Date   WBC 2.5* 10/20/2013   HGB 13.2 10/20/2013   HCT 39.5 10/20/2013   MCV 86.1 10/20/2013   PLT 180 10/20/2013     Chemistry      Component Value Date/Time   NA 135* 10/20/2013 0928   NA 140 02/24/2012 1249   K 3.8 10/20/2013 0928   K 3.9 02/24/2012 1249   CL 106 12/27/2012 0801   CL 110 02/24/2012 1249   CO2 24 10/20/2013 0928   CO2 23 02/24/2012 1249   BUN 27.2* 10/20/2013 0928   BUN 18 02/24/2012 1249   CREATININE 1.1 10/20/2013 0928   CREATININE 0.98 02/24/2012 1249      Component Value Date/Time   CALCIUM 9.3 10/20/2013 0928   CALCIUM 8.9 02/24/2012 1249   ALKPHOS 120 10/20/2013 0928   ALKPHOS 85 02/24/2012 1249   AST 28 10/20/2013 0928   AST 18 02/24/2012 1249   ALT 28 10/20/2013 0928   ALT 12 02/24/2012 1249   BILITOT 1.00 10/20/2013 0928   BILITOT 0.6 02/24/2012 1249      Soft tissue mass, biopsy, 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 - PLEASE SEE COMMENT.   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 He is currently being treated with systemic chemotherapy with carboplatin for an AUC of 5 given on day 1 and gemcitabine at 1000 mg per meter squared given days 1 and 8 every 3 weeks, status post day 1 of cycle 1. Counts were reviewed with Dr. Alen Blew and he will proceed with day 8 today as scheduled. Patient will followup in 3 weeks on day 8 of cycle #2 .  2. Pelvic discomfort: This could be related to this infiltrative mass and certainly could be incidental and unrelated musculoskeletal complaints. We will continue to monitor this and he is to continue his current pain medication.   3. Anti-emetics: Compazine prescription was called in electronically for him today.  4. Followup: As previously scheduled prior to day 8 of cycle #2  [name], PA-C  4/10/20153:51 PM

## 2013-10-20 NOTE — Progress Notes (Signed)
Ok to treat with Shelby @ 1.3, per Dr. Alen Blew.

## 2013-10-23 NOTE — Patient Instructions (Signed)
Continue labs and chemotherapy as scheduled Follow up in 3 weeks 

## 2013-11-03 ENCOUNTER — Other Ambulatory Visit (HOSPITAL_BASED_OUTPATIENT_CLINIC_OR_DEPARTMENT_OTHER): Payer: Medicare Other

## 2013-11-03 ENCOUNTER — Ambulatory Visit (HOSPITAL_BASED_OUTPATIENT_CLINIC_OR_DEPARTMENT_OTHER): Payer: Medicare Other

## 2013-11-03 VITALS — BP 143/71 | HR 78 | Temp 97.4°F | Resp 19

## 2013-11-03 DIAGNOSIS — Z5111 Encounter for antineoplastic chemotherapy: Secondary | ICD-10-CM

## 2013-11-03 DIAGNOSIS — C679 Malignant neoplasm of bladder, unspecified: Secondary | ICD-10-CM

## 2013-11-03 LAB — CBC WITH DIFFERENTIAL/PLATELET
BASO%: 1.3 % (ref 0.0–2.0)
Basophils Absolute: 0.1 10*3/uL (ref 0.0–0.1)
EOS%: 0.3 % (ref 0.0–7.0)
Eosinophils Absolute: 0 10*3/uL (ref 0.0–0.5)
HEMATOCRIT: 35.6 % — AB (ref 38.4–49.9)
HEMOGLOBIN: 12.1 g/dL — AB (ref 13.0–17.1)
LYMPH#: 1.4 10*3/uL (ref 0.9–3.3)
LYMPH%: 37.3 % (ref 14.0–49.0)
MCH: 28.6 pg (ref 27.2–33.4)
MCHC: 34 g/dL (ref 32.0–36.0)
MCV: 84.2 fL (ref 79.3–98.0)
MONO#: 0.9 10*3/uL (ref 0.1–0.9)
MONO%: 23.2 % — ABNORMAL HIGH (ref 0.0–14.0)
NEUT#: 1.5 10*3/uL (ref 1.5–6.5)
NEUT%: 37.9 % — AB (ref 39.0–75.0)
PLATELETS: 298 10*3/uL (ref 140–400)
RBC: 4.23 10*6/uL (ref 4.20–5.82)
RDW: 14.1 % (ref 11.0–14.6)
WBC: 3.8 10*3/uL — ABNORMAL LOW (ref 4.0–10.3)
nRBC: 0 % (ref 0–0)

## 2013-11-03 LAB — COMPREHENSIVE METABOLIC PANEL (CC13)
ALK PHOS: 120 U/L (ref 40–150)
ALT: 49 U/L (ref 0–55)
AST: 35 U/L — ABNORMAL HIGH (ref 5–34)
Albumin: 3.1 g/dL — ABNORMAL LOW (ref 3.5–5.0)
Anion Gap: 12 mEq/L — ABNORMAL HIGH (ref 3–11)
BUN: 16.5 mg/dL (ref 7.0–26.0)
CALCIUM: 9.8 mg/dL (ref 8.4–10.4)
CO2: 20 mEq/L — ABNORMAL LOW (ref 22–29)
CREATININE: 1 mg/dL (ref 0.7–1.3)
Chloride: 108 mEq/L (ref 98–109)
Glucose: 130 mg/dl (ref 70–140)
Potassium: 3.6 mEq/L (ref 3.5–5.1)
Sodium: 140 mEq/L (ref 136–145)
Total Bilirubin: 0.43 mg/dL (ref 0.20–1.20)
Total Protein: 7.9 g/dL (ref 6.4–8.3)

## 2013-11-03 LAB — TECHNOLOGIST REVIEW

## 2013-11-03 MED ORDER — SODIUM CHLORIDE 0.9 % IV SOLN
1000.0000 mg/m2 | Freq: Once | INTRAVENOUS | Status: AC
  Administered 2013-11-03: 2204 mg via INTRAVENOUS
  Filled 2013-11-03: qty 57.97

## 2013-11-03 MED ORDER — DEXAMETHASONE SODIUM PHOSPHATE 20 MG/5ML IJ SOLN
INTRAMUSCULAR | Status: AC
  Filled 2013-11-03: qty 5

## 2013-11-03 MED ORDER — DEXAMETHASONE SODIUM PHOSPHATE 20 MG/5ML IJ SOLN
20.0000 mg | Freq: Once | INTRAMUSCULAR | Status: AC
  Administered 2013-11-03: 20 mg via INTRAVENOUS

## 2013-11-03 MED ORDER — ONDANSETRON 16 MG/50ML IVPB (CHCC)
16.0000 mg | Freq: Once | INTRAVENOUS | Status: AC
Start: 2013-11-03 — End: 2013-11-03
  Administered 2013-11-03: 16 mg via INTRAVENOUS

## 2013-11-03 MED ORDER — ONDANSETRON 16 MG/50ML IVPB (CHCC)
INTRAVENOUS | Status: AC
  Filled 2013-11-03: qty 16

## 2013-11-03 MED ORDER — SODIUM CHLORIDE 0.9 % IV SOLN
582.0000 mg | Freq: Once | INTRAVENOUS | Status: AC
  Administered 2013-11-03: 580 mg via INTRAVENOUS
  Filled 2013-11-03: qty 58

## 2013-11-03 MED ORDER — SODIUM CHLORIDE 0.9 % IV SOLN
Freq: Once | INTRAVENOUS | Status: AC
  Administered 2013-11-03: 09:00:00 via INTRAVENOUS

## 2013-11-03 NOTE — Progress Notes (Signed)
CMET drawn in infusion room. CMET/CBC results within treatment parameters. Patient tolerated infusion well.

## 2013-11-03 NOTE — Patient Instructions (Signed)
[facility] Discharge Instructions for Patients Receiving Chemotherapy  Today you received the following chemotherapy agents: Gemzar, Carboplatin  To help prevent nausea and vomiting after your treatment, we encourage you to take your nausea medication: Compazine 10 mg every 6 hrs as needed.    If you develop nausea and vomiting that is not controlled by your nausea medication, call the clinic.   BELOW ARE SYMPTOMS THAT SHOULD BE REPORTED IMMEDIATELY:  *FEVER GREATER THAN 100.5 F  *CHILLS WITH OR WITHOUT FEVER  NAUSEA AND VOMITING THAT IS NOT CONTROLLED WITH YOUR NAUSEA MEDICATION  *UNUSUAL SHORTNESS OF BREATH  *UNUSUAL BRUISING OR BLEEDING  TENDERNESS IN MOUTH AND THROAT WITH OR WITHOUT PRESENCE OF ULCERS  *URINARY PROBLEMS  *BOWEL PROBLEMS  UNUSUAL RASH Items with * indicate a potential emergency and should be followed up as soon as possible.  Feel free to call the clinic you have any questions or concerns. The clinic phone number is [phone].    

## 2013-11-10 ENCOUNTER — Ambulatory Visit (HOSPITAL_BASED_OUTPATIENT_CLINIC_OR_DEPARTMENT_OTHER): Payer: Medicare Other

## 2013-11-10 ENCOUNTER — Other Ambulatory Visit: Payer: Medicare Other

## 2013-11-10 ENCOUNTER — Ambulatory Visit: Payer: Medicare Other | Admitting: Oncology

## 2013-11-10 ENCOUNTER — Ambulatory Visit: Payer: Medicare Other | Admitting: Physician Assistant

## 2013-11-10 ENCOUNTER — Telehealth: Payer: Self-pay | Admitting: Oncology

## 2013-11-10 ENCOUNTER — Telehealth: Payer: Self-pay | Admitting: *Deleted

## 2013-11-10 ENCOUNTER — Ambulatory Visit (HOSPITAL_BASED_OUTPATIENT_CLINIC_OR_DEPARTMENT_OTHER): Payer: Medicare Other | Admitting: Oncology

## 2013-11-10 ENCOUNTER — Encounter: Payer: Self-pay | Admitting: Oncology

## 2013-11-10 ENCOUNTER — Other Ambulatory Visit (HOSPITAL_BASED_OUTPATIENT_CLINIC_OR_DEPARTMENT_OTHER): Payer: Medicare Other

## 2013-11-10 VITALS — BP 126/71 | HR 71 | Temp 98.1°F | Resp 20 | Ht 72.0 in | Wt 199.3 lb

## 2013-11-10 DIAGNOSIS — R634 Abnormal weight loss: Secondary | ICD-10-CM

## 2013-11-10 DIAGNOSIS — C679 Malignant neoplasm of bladder, unspecified: Secondary | ICD-10-CM

## 2013-11-10 DIAGNOSIS — Z5111 Encounter for antineoplastic chemotherapy: Secondary | ICD-10-CM

## 2013-11-10 DIAGNOSIS — Z8546 Personal history of malignant neoplasm of prostate: Secondary | ICD-10-CM

## 2013-11-10 LAB — CBC WITH DIFFERENTIAL/PLATELET
BASO%: 0.7 % (ref 0.0–2.0)
Basophils Absolute: 0 10*3/uL (ref 0.0–0.1)
EOS%: 0 % (ref 0.0–7.0)
Eosinophils Absolute: 0 10*3/uL (ref 0.0–0.5)
HCT: 32.1 % — ABNORMAL LOW (ref 38.4–49.9)
HGB: 11.3 g/dL — ABNORMAL LOW (ref 13.0–17.1)
LYMPH#: 1.2 10*3/uL (ref 0.9–3.3)
LYMPH%: 41.5 % (ref 14.0–49.0)
MCH: 28.8 pg (ref 27.2–33.4)
MCHC: 35.2 g/dL (ref 32.0–36.0)
MCV: 81.7 fL (ref 79.3–98.0)
MONO#: 0.4 10*3/uL (ref 0.1–0.9)
MONO%: 13 % (ref 0.0–14.0)
NEUT#: 1.3 10*3/uL — ABNORMAL LOW (ref 1.5–6.5)
NEUT%: 44.8 % (ref 39.0–75.0)
Platelets: 250 10*3/uL (ref 140–400)
RBC: 3.93 10*6/uL — AB (ref 4.20–5.82)
RDW: 13.9 % (ref 11.0–14.6)
WBC: 2.8 10*3/uL — AB (ref 4.0–10.3)

## 2013-11-10 LAB — COMPREHENSIVE METABOLIC PANEL (CC13)
ALT: 34 U/L (ref 0–55)
ANION GAP: 11 meq/L (ref 3–11)
AST: 31 U/L (ref 5–34)
Albumin: 3.3 g/dL — ABNORMAL LOW (ref 3.5–5.0)
Alkaline Phosphatase: 115 U/L (ref 40–150)
BILIRUBIN TOTAL: 0.73 mg/dL (ref 0.20–1.20)
BUN: 25.5 mg/dL (ref 7.0–26.0)
CO2: 20 meq/L — AB (ref 22–29)
Calcium: 9.6 mg/dL (ref 8.4–10.4)
Chloride: 108 mEq/L (ref 98–109)
Creatinine: 1.1 mg/dL (ref 0.7–1.3)
Glucose: 103 mg/dl (ref 70–140)
POTASSIUM: 3.7 meq/L (ref 3.5–5.1)
SODIUM: 139 meq/L (ref 136–145)
TOTAL PROTEIN: 7.7 g/dL (ref 6.4–8.3)

## 2013-11-10 MED ORDER — PROCHLORPERAZINE MALEATE 10 MG PO TABS
ORAL_TABLET | ORAL | Status: AC
  Filled 2013-11-10: qty 1

## 2013-11-10 MED ORDER — PROCHLORPERAZINE MALEATE 10 MG PO TABS
10.0000 mg | ORAL_TABLET | Freq: Once | ORAL | Status: AC
  Administered 2013-11-10: 10 mg via ORAL

## 2013-11-10 MED ORDER — SODIUM CHLORIDE 0.9 % IV SOLN
1000.0000 mg/m2 | Freq: Once | INTRAVENOUS | Status: AC
  Administered 2013-11-10: 2204 mg via INTRAVENOUS
  Filled 2013-11-10: qty 57.97

## 2013-11-10 MED ORDER — SODIUM CHLORIDE 0.9 % IV SOLN
Freq: Once | INTRAVENOUS | Status: AC
  Administered 2013-11-10: 12:00:00 via INTRAVENOUS

## 2013-11-10 NOTE — Progress Notes (Signed)
Ok to treat per Mikey Bussing, NP/Dr. Alen Blew with WBC 2.8 ANC 1.3.

## 2013-11-10 NOTE — Telephone Encounter (Signed)
Sent Joe Park a staff message to add the chemo appts for may.

## 2013-11-10 NOTE — Telephone Encounter (Signed)
Per staff message and POF I have scheduled appts.  JMW  

## 2013-11-10 NOTE — Telephone Encounter (Signed)
gve the pt his may 2015 appt calendar °

## 2013-11-10 NOTE — Progress Notes (Signed)
Hematology and Oncology Follow Up Visit  Joe Park [id number] 1941-06-11 73 y.o. 11/10/2013 1:58 PM   Principle Diagnosis: 73 year old man diagnosed with bladder cancer in 09/2010.   Prior Therapy: He is S/P cystoprostatectomy and bilateral pelvic lymph node dissection and creation of an ileal conduit done on 11/11/2011. 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  The malignancy extends through the bladder wall into the perivesical soft tissue. The malignancy identified at the black anterior surface of he bladder. Four out of 4 lymph nodes in the right pelvis were negative and 6 out of 6 in the left pelvis were negative. The pathological staging was T3B N0.  Secondary Diagnosis: Prostate cancer diagnosed in 2002. Not active at this time.   Current therapy: Systemic chemotherapy with carboplatin for an AUC of 5 given on day 1 and gemcitabine at 1000 mg per meter square given on days 1 and 8 every 3 weeks. Here for cycle 2 day 8.  Interim History:  Joe Park for a follow up visit prior to proceeding with day 8 of cycle 1 of his systemic chemotherapy with carboplatin and gemcitabine. Overall he has been tolerating chemotherapy relatively well with the exception of some nausea. This is well controlled with his current antiemetic. His pelvic pain is much better since starting chemotherapy. His appetite has been decreased and he has lost weight. He has not been drinking any Ensure or Boost. He voiced no other specific complaints. Denies any fever chills, no nausea vomiting or night sweats. Denies cough or hemoptysis or hematemesis. Denies abdominal pain no hematochezia or melena. Has not reported any genitourinary complaints.   Medications: I have reviewed the patient's current medications. Current Outpatient Prescriptions  Medication Sig Dispense Refill  . aspirin 81  MG chewable tablet Chew 81 mg by mouth daily with breakfast.       . Chlorhexidine Gluconate (PERIOGARD MT) Use as directed 0.5 oz in the mouth or throat at bedtime. For 2 months      . levothyroxine (SYNTHROID, LEVOTHROID) 125 MCG tablet Take 125 mcg by mouth daily with breakfast.       . lisinopril (PRINIVIL,ZESTRIL) 40 MG tablet Take 40 mg by mouth every morning.      . Multiple Vitamin (MULITIVITAMIN WITH MINERALS) TABS Take 1 tablet by mouth every morning.      . prochlorperazine (COMPAZINE) 10 MG tablet Take 1 tablet (10 mg total) by mouth every 6 (six) hours as needed for nausea or vomiting.  60 tablet  1  . simvastatin (ZOCOR) 40 MG tablet Take 40 mg by mouth at bedtime.      . traMADol (ULTRAM) 50 MG tablet Take 1 tablet by mouth daily.      . [DISCONTINUED] hydrALAZINE (APRESOLINE) 25 MG tablet Take 12.5 mg by mouth every morning.       No current facility-administered medications for this visit.    Allergies: No Known Allergies  Past Medical History, Surgical history, Social history, and Family History were reviewed and updated.  Review of Systems:  Remaining ROS negative.  Physical Exam: Blood pressure 126/71, pulse 71, temperature 98.1 F (36.7 C), temperature source Oral, resp. rate 20, height 6' (1.829 m), weight 199 lb 4.8 oz (90.402 kg), SpO2 99.00%. ECOG: 1 General appearance: alert Head: Normocephalic, without obvious abnormality, atraumatic Neck: no adenopathy, no carotid bruit, no JVD, supple, symmetrical, trachea midline and thyroid  not enlarged, symmetric, no tenderness/mass/nodules Lymph nodes: Cervical, supraclavicular, and axillary nodes normal. Heart:regular rate and rhythm, S1, S2 normal, no murmur, click, rub or gallop Lung:chest clear, no wheezing, rales, normal symmetric air entry Abdomen: soft, non-tender, without masses or organomegaly EXT:no erythema, induration, or nodules Neurological examination: No deficits at this time noted. He was able to  ambulate without any difficulties  Lab Results: Lab Results  Component Value Date   WBC 2.8* 11/10/2013   HGB 11.3* 11/10/2013   HCT 32.1* 11/10/2013   MCV 81.7 11/10/2013   PLT 250 11/10/2013     Chemistry      Component Value Date/Time   NA 139 11/10/2013 1100   NA 140 02/24/2012 1249   K 3.7 11/10/2013 1100   K 3.9 02/24/2012 1249   CL 106 12/27/2012 0801   CL 110 02/24/2012 1249   CO2 20* 11/10/2013 1100   CO2 23 02/24/2012 1249   BUN 25.5 11/10/2013 1100   BUN 18 02/24/2012 1249   CREATININE 1.1 11/10/2013 1100   CREATININE 0.98 02/24/2012 1249      Component Value Date/Time   CALCIUM 9.6 11/10/2013 1100   CALCIUM 8.9 02/24/2012 1249   ALKPHOS 115 11/10/2013 1100   ALKPHOS 85 02/24/2012 1249   AST 31 11/10/2013 1100   AST 18 02/24/2012 1249   ALT 34 11/10/2013 1100   ALT 12 02/24/2012 1249   BILITOT 0.73 11/10/2013 1100   BILITOT 0.6 02/24/2012 1249      Impression and Plan: 73 year old with the following issue: 1.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 He is currently being treated with systemic chemotherapy with carboplatin for an AUC of 5 given on day 1 and gemcitabine at 1000 mg per meter squared given days 1 and 8 every 3 weeks, here for cycle 2 day 8. Counts were reviewed with Dr. Alen Park and he will proceed with day 8 Park as scheduled. Patient will followup in 2 weeks for cycle 3 day 1.  2. Pelvic discomfort: This could be related to this infiltrative mass and certainly could be incidental and unrelated musculoskeletal complaints. Pain is much better with chemotherapy.    3. Anti-emetics: He has Compazine at home.  4. Weight loss. I have encouraged him to drink 2-3 cans of Ensure per day. Will consider referral to Joe Park if weight loss continues.  5. Followup: 2 weeks for cycle 3 day 1.  Joe Park  5/1/20151:58 PM

## 2013-11-10 NOTE — Patient Instructions (Signed)
[facility] Discharge Instructions for Patients Receiving Chemotherapy  Today you received the following chemotherapy agents Gemzars.  To help prevent nausea and vomiting after your treatment, we encourage you to take your nausea medication. If you develop nausea and vomiting that is not controlled by your nausea medication, call the clinic.   BELOW ARE SYMPTOMS THAT SHOULD BE REPORTED IMMEDIATELY:  *FEVER GREATER THAN 100.5 F  *CHILLS WITH OR WITHOUT FEVER  NAUSEA AND VOMITING THAT IS NOT CONTROLLED WITH YOUR NAUSEA MEDICATION  *UNUSUAL SHORTNESS OF BREATH  *UNUSUAL BRUISING OR BLEEDING  TENDERNESS IN MOUTH AND THROAT WITH OR WITHOUT PRESENCE OF ULCERS  *URINARY PROBLEMS  *BOWEL PROBLEMS  UNUSUAL RASH Items with * indicate a potential emergency and should be followed up as soon as possible.  Feel free to call the clinic you have any questions or concerns. The clinic phone number is [phone].

## 2013-11-14 ENCOUNTER — Telehealth: Payer: Self-pay | Admitting: Dietician

## 2013-11-14 NOTE — Telephone Encounter (Signed)
Brief Outpatient Oncology Nutrition Note  Patient has been identified to be at risk on malnutrition screen.  Wt Readings from Last 10 Encounters:  11/10/13 199 lb 4.8 oz (90.402 kg)  10/20/13 207 lb (93.895 kg)  10/04/13 213 lb 4.8 oz (96.752 kg)  09/26/13 214 lb (97.07 kg)  09/20/13 214 lb 9.6 oz (97.342 kg)  04/28/13 228 lb 1.6 oz (103.465 kg)  12/30/12 235 lb 11.2 oz (106.913 kg)  09/01/12 228 lb (103.42 kg)  04/28/12 229 lb 4.8 oz (104.01 kg)  02/24/12 219 lb 14.4 oz (99.746 kg)      Dx:  Bladder Cancer  Called and spoke with patient due to weight loss.  Patient states that he has tried Ensure and likes this and is eating regularly with good intake.    Encouraged patient to drink 3 protein supplements daily.  Will mail patient handout on increasing calories and protein along with coupons for protein supplements and contact information for the [place] [facility].  Antonieta Iba, RD, LDN

## 2013-11-21 ENCOUNTER — Ambulatory Visit: Payer: Medicare Other | Admitting: Oncology

## 2013-11-21 ENCOUNTER — Other Ambulatory Visit: Payer: Medicare Other

## 2013-11-24 ENCOUNTER — Telehealth: Payer: Self-pay | Admitting: Oncology

## 2013-11-24 ENCOUNTER — Other Ambulatory Visit (HOSPITAL_BASED_OUTPATIENT_CLINIC_OR_DEPARTMENT_OTHER): Payer: Medicare Other

## 2013-11-24 ENCOUNTER — Ambulatory Visit (HOSPITAL_BASED_OUTPATIENT_CLINIC_OR_DEPARTMENT_OTHER): Payer: Medicare Other | Admitting: Oncology

## 2013-11-24 ENCOUNTER — Ambulatory Visit (HOSPITAL_BASED_OUTPATIENT_CLINIC_OR_DEPARTMENT_OTHER): Payer: Medicare Other

## 2013-11-24 ENCOUNTER — Encounter: Payer: Self-pay | Admitting: Oncology

## 2013-11-24 VITALS — BP 124/66 | HR 82 | Temp 97.3°F | Resp 20 | Ht 72.0 in | Wt 203.9 lb

## 2013-11-24 DIAGNOSIS — C679 Malignant neoplasm of bladder, unspecified: Secondary | ICD-10-CM

## 2013-11-24 DIAGNOSIS — C50919 Malignant neoplasm of unspecified site of unspecified female breast: Secondary | ICD-10-CM

## 2013-11-24 DIAGNOSIS — C779 Secondary and unspecified malignant neoplasm of lymph node, unspecified: Secondary | ICD-10-CM

## 2013-11-24 DIAGNOSIS — Z5111 Encounter for antineoplastic chemotherapy: Secondary | ICD-10-CM

## 2013-11-24 LAB — CBC WITH DIFFERENTIAL/PLATELET
BASO%: 0.3 % (ref 0.0–2.0)
BASOS ABS: 0 10*3/uL (ref 0.0–0.1)
EOS ABS: 0 10*3/uL (ref 0.0–0.5)
EOS%: 1.1 % (ref 0.0–7.0)
HCT: 29.6 % — ABNORMAL LOW (ref 38.4–49.9)
HGB: 9.9 g/dL — ABNORMAL LOW (ref 13.0–17.1)
LYMPH%: 28.5 % (ref 14.0–49.0)
MCH: 29 pg (ref 27.2–33.4)
MCHC: 33.5 g/dL (ref 32.0–36.0)
MCV: 86.5 fL (ref 79.3–98.0)
MONO#: 1.1 10*3/uL — AB (ref 0.1–0.9)
MONO%: 29.3 % — ABNORMAL HIGH (ref 0.0–14.0)
NEUT%: 40.8 % (ref 39.0–75.0)
NEUTROS ABS: 1.6 10*3/uL (ref 1.5–6.5)
PLATELETS: 238 10*3/uL (ref 140–400)
RBC: 3.42 10*6/uL — AB (ref 4.20–5.82)
RDW: 15 % — ABNORMAL HIGH (ref 11.0–14.6)
WBC: 3.9 10*3/uL — AB (ref 4.0–10.3)
lymph#: 1.1 10*3/uL (ref 0.9–3.3)

## 2013-11-24 LAB — COMPREHENSIVE METABOLIC PANEL (CC13)
ALT: 33 U/L (ref 0–55)
ANION GAP: 10 meq/L (ref 3–11)
AST: 34 U/L (ref 5–34)
Albumin: 3.4 g/dL — ABNORMAL LOW (ref 3.5–5.0)
Alkaline Phosphatase: 104 U/L (ref 40–150)
BUN: 11.7 mg/dL (ref 7.0–26.0)
CO2: 21 meq/L — AB (ref 22–29)
Calcium: 9.7 mg/dL (ref 8.4–10.4)
Chloride: 108 mEq/L (ref 98–109)
Creatinine: 0.9 mg/dL (ref 0.7–1.3)
GLUCOSE: 91 mg/dL (ref 70–140)
Potassium: 3.8 mEq/L (ref 3.5–5.1)
Sodium: 139 mEq/L (ref 136–145)
TOTAL PROTEIN: 7.4 g/dL (ref 6.4–8.3)
Total Bilirubin: 0.51 mg/dL (ref 0.20–1.20)

## 2013-11-24 LAB — TECHNOLOGIST REVIEW

## 2013-11-24 MED ORDER — SODIUM CHLORIDE 0.9 % IV SOLN
1000.0000 mg/m2 | Freq: Once | INTRAVENOUS | Status: AC
  Administered 2013-11-24: 2204 mg via INTRAVENOUS
  Filled 2013-11-24: qty 58

## 2013-11-24 MED ORDER — DEXAMETHASONE SODIUM PHOSPHATE 20 MG/5ML IJ SOLN
INTRAMUSCULAR | Status: AC
  Filled 2013-11-24: qty 5

## 2013-11-24 MED ORDER — ONDANSETRON 16 MG/50ML IVPB (CHCC)
INTRAVENOUS | Status: AC
Start: 2013-11-24 — End: 2013-11-24
  Filled 2013-11-24: qty 16

## 2013-11-24 MED ORDER — DEXAMETHASONE SODIUM PHOSPHATE 10 MG/ML IJ SOLN
INTRAMUSCULAR | Status: AC
  Filled 2013-11-24: qty 1

## 2013-11-24 MED ORDER — ONDANSETRON 16 MG/50ML IVPB (CHCC)
16.0000 mg | Freq: Once | INTRAVENOUS | Status: AC
  Administered 2013-11-24: 16 mg via INTRAVENOUS

## 2013-11-24 MED ORDER — SODIUM CHLORIDE 0.9 % IV SOLN
582.0000 mg | Freq: Once | INTRAVENOUS | Status: AC
  Administered 2013-11-24: 580 mg via INTRAVENOUS
  Filled 2013-11-24: qty 58

## 2013-11-24 MED ORDER — ONDANSETRON 8 MG/NS 50 ML IVPB
INTRAVENOUS | Status: AC
  Filled 2013-11-24: qty 8

## 2013-11-24 MED ORDER — SODIUM CHLORIDE 0.9 % IV SOLN
Freq: Once | INTRAVENOUS | Status: AC
  Administered 2013-11-24: 14:00:00 via INTRAVENOUS

## 2013-11-24 MED ORDER — DEXAMETHASONE SODIUM PHOSPHATE 20 MG/5ML IJ SOLN
20.0000 mg | Freq: Once | INTRAMUSCULAR | Status: AC
  Administered 2013-11-24: 20 mg via INTRAVENOUS

## 2013-11-24 NOTE — Progress Notes (Signed)
Hematology and Oncology Follow Up Visit  Joe Park [id number] [birth year] 73 y.o. 11/24/2013 1:11 PM   Principle Diagnosis: 73 year old man diagnosed with bladder cancer in 09/2011.   Prior Therapy: He is S/P cystoprostatectomy and bilateral pelvic lymph node dissection and creation of an ileal conduit done on 11/11/2011. 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  The malignancy extends through the bladder wall into the perivesical soft tissue. The malignancy identified at the black anterior surface of he bladder. Four out of 4 lymph nodes in the right pelvis were negative and 6 out of 6 in the left pelvis were negative. The pathological staging was T3B N0.  Secondary Diagnosis: Prostate cancer diagnosed in 2002. Not active at this time.   Current therapy: Systemic chemotherapy with carboplatin for an AUC of 5 given on day 1 and gemcitabine at 1000 mg per meter square given on days 1 and 8 every 3 weeks. Here for cycle 3 day 1.  Interim History:  Joe Park returns today for a follow up visit prior to proceeding with day 1 of cycle 3 of his systemic chemotherapy with carboplatin and gemcitabine. Overall he has been tolerating chemotherapy  well with the exception of grade 1 nausea. This is well controlled with his current antiemetic. His pelvic pain is much better since starting chemotherapy. His appetite has been decreased and he has lost weight. He dnies any fever chills, no nausea vomiting or night sweats. Denies cough or hemoptysis or hematemesis. Denies abdominal pain no hematochezia or melena. Has not reported any genitourinary complaints. He declined headaches or blurry vision or double vision. Did not report any syncope or seizures. Did not report any chest pain shortness of breath or cough or hemoptysis. Did not report any hematochezia or melena. Does not report any  urinary symptoms. Does not report any musculoskeletal complaints. Did not report any arthralgias myalgias or rash or pruritus. Rest or view of system is unremarkable.   Medications: I have reviewed the patient's current medications. Current Outpatient Prescriptions  Medication Sig Dispense Refill  . aspirin 81 MG chewable tablet Chew 81 mg by mouth daily with breakfast.       . Chlorhexidine Gluconate (PERIOGARD MT) Use as directed 0.5 oz in the mouth or throat at bedtime. For 2 months      . levothyroxine (SYNTHROID, LEVOTHROID) 125 MCG tablet Take 125 mcg by mouth daily with breakfast.       . lisinopril (PRINIVIL,ZESTRIL) 40 MG tablet Take 40 mg by mouth every morning.      . Multiple Vitamin (MULITIVITAMIN WITH MINERALS) TABS Take 1 tablet by mouth every morning.      . prochlorperazine (COMPAZINE) 10 MG tablet Take 1 tablet (10 mg total) by mouth every 6 (six) hours as needed for nausea or vomiting.  60 tablet  1  . simvastatin (ZOCOR) 40 MG tablet Take 40 mg by mouth at bedtime.      . traMADol (ULTRAM) 50 MG tablet Take 1 tablet by mouth daily.      . [DISCONTINUED] hydrALAZINE (APRESOLINE) 25 MG tablet Take 12.5 mg by mouth every morning.       No current facility-administered medications for this visit.    Allergies: No Known Allergies  Past Medical History, Surgical history, Social history, and Family History were reviewed and updated.    Physical Exam: Blood pressure 124/66, pulse 82, temperature 97.3 F (  36.3 C), temperature source Oral, resp. rate 20, height 6' (1.829 m), weight 203 lb 14.4 oz (92.488 kg), SpO2 100.00%. ECOG: 1 General appearance: alert awake not in any distress. Head: Normocephalic, without obvious abnormality, atraumatic Neck: no adenopathy, no thyroid masses. Lymph nodes: Cervical, supraclavicular, and axillary nodes normal. Heart:regular rate and rhythm, S1, S2 normal, no murmur, click, rub or gallop Lung:chest clear, no wheezing, rales, normal  symmetric air entry Abdomen: soft, non-tender, without masses or organomegaly no ascites or shifting dullness. EXT:no erythema, induration, or nodules Neurological examination: No deficits at this time noted. He was able to ambulate without any difficulties  Lab Results: Lab Results  Component Value Date   WBC 3.9* 11/24/2013   HGB 9.9* 11/24/2013   HCT 29.6* 11/24/2013   MCV 86.5 11/24/2013   PLT 238 11/24/2013     Chemistry      Component Value Date/Time   NA 139 11/10/2013 1100   NA 140 02/24/2012 1249   K 3.7 11/10/2013 1100   K 3.9 02/24/2012 1249   CL 106 12/27/2012 0801   CL 110 02/24/2012 1249   CO2 20* 11/10/2013 1100   CO2 23 02/24/2012 1249   BUN 25.5 11/10/2013 1100   BUN 18 02/24/2012 1249   CREATININE 1.1 11/10/2013 1100   CREATININE 0.98 02/24/2012 1249      Component Value Date/Time   CALCIUM 9.6 11/10/2013 1100   CALCIUM 8.9 02/24/2012 1249   ALKPHOS 115 11/10/2013 1100   ALKPHOS 85 02/24/2012 1249   AST 31 11/10/2013 1100   AST 18 02/24/2012 1249   ALT 34 11/10/2013 1100   ALT 12 02/24/2012 1249   BILITOT 0.73 11/10/2013 1100   BILITOT 0.6 02/24/2012 1249      Impression and Plan: 73 year old with the following issue:  1.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 He developed pelvic recurrence and he is currently being treated with systemic chemotherapy with carboplatin for an AUC of 5 given on day 1 and gemcitabine at 1000 mg per meter squared given days 1 and 8 every 3 weeks, here for cycle 3 day 1. His laboratory data were reviewed today and they are adequate. We will proceed with chemotherapy today and obtain imaging studies upon the completion of cycle 3.  2. Pelvic discomfort: This could be related to this infiltrative mass. Pain is much better with chemotherapy.    3. Anti-emetics: He has Compazine at home.  4. Weight loss. I have encouraged him to drink 2-3 cans of Ensure per day. His weight is improved by 4 pounds since the last  visit.  5. Followup: He will followup on 12/01/2013 for day 8 cycle 3. He will obtain a CT scan on 12/13/2013 and a followup with me before the beginning of cycle 4 on 12/15/2013.   Wyatt Portela  5/15/20151:11 PM

## 2013-11-24 NOTE — Telephone Encounter (Signed)
gv an dpritned aptp sched and avs for p5t for May and June...sed added tx...gv pt barium

## 2013-11-24 NOTE — Patient Instructions (Signed)
[facility] Discharge Instructions for Patients Receiving Chemotherapy  Today you received the following chemotherapy agents: Gemzar, Carboplatin  To help prevent nausea and vomiting after your treatment, we encourage you to take your nausea medication: Compazine 10 mg every 6 hrs as needed.    If you develop nausea and vomiting that is not controlled by your nausea medication, call the clinic.   BELOW ARE SYMPTOMS THAT SHOULD BE REPORTED IMMEDIATELY:  *FEVER GREATER THAN 100.5 F  *CHILLS WITH OR WITHOUT FEVER  NAUSEA AND VOMITING THAT IS NOT CONTROLLED WITH YOUR NAUSEA MEDICATION  *UNUSUAL SHORTNESS OF BREATH  *UNUSUAL BRUISING OR BLEEDING  TENDERNESS IN MOUTH AND THROAT WITH OR WITHOUT PRESENCE OF ULCERS  *URINARY PROBLEMS  *BOWEL PROBLEMS  UNUSUAL RASH Items with * indicate a potential emergency and should be followed up as soon as possible.  Feel free to call the clinic you have any questions or concerns. The clinic phone number is [phone].

## 2013-12-01 ENCOUNTER — Ambulatory Visit (HOSPITAL_BASED_OUTPATIENT_CLINIC_OR_DEPARTMENT_OTHER): Payer: Medicare Other

## 2013-12-01 ENCOUNTER — Other Ambulatory Visit (HOSPITAL_BASED_OUTPATIENT_CLINIC_OR_DEPARTMENT_OTHER): Payer: Medicare Other

## 2013-12-01 VITALS — BP 101/66 | HR 69 | Temp 97.8°F | Resp 18

## 2013-12-01 DIAGNOSIS — C679 Malignant neoplasm of bladder, unspecified: Secondary | ICD-10-CM

## 2013-12-01 DIAGNOSIS — C779 Secondary and unspecified malignant neoplasm of lymph node, unspecified: Secondary | ICD-10-CM

## 2013-12-01 DIAGNOSIS — C50919 Malignant neoplasm of unspecified site of unspecified female breast: Secondary | ICD-10-CM

## 2013-12-01 DIAGNOSIS — Z5111 Encounter for antineoplastic chemotherapy: Secondary | ICD-10-CM

## 2013-12-01 LAB — CBC WITH DIFFERENTIAL/PLATELET
BASO%: 0.6 % (ref 0.0–2.0)
BASOS ABS: 0 10*3/uL (ref 0.0–0.1)
EOS%: 0.1 % (ref 0.0–7.0)
Eosinophils Absolute: 0 10*3/uL (ref 0.0–0.5)
HEMATOCRIT: 27.2 % — AB (ref 38.4–49.9)
HGB: 9.1 g/dL — ABNORMAL LOW (ref 13.0–17.1)
LYMPH#: 1.1 10*3/uL (ref 0.9–3.3)
LYMPH%: 40.4 % (ref 14.0–49.0)
MCH: 29.2 pg (ref 27.2–33.4)
MCHC: 33.6 g/dL (ref 32.0–36.0)
MCV: 87.1 fL (ref 79.3–98.0)
MONO#: 0.2 10*3/uL (ref 0.1–0.9)
MONO%: 6.2 % (ref 0.0–14.0)
NEUT#: 1.4 10*3/uL — ABNORMAL LOW (ref 1.5–6.5)
NEUT%: 52.7 % (ref 39.0–75.0)
Platelets: 292 10*3/uL (ref 140–400)
RBC: 3.13 10*6/uL — ABNORMAL LOW (ref 4.20–5.82)
RDW: 15.6 % — ABNORMAL HIGH (ref 11.0–14.6)
WBC: 2.7 10*3/uL — ABNORMAL LOW (ref 4.0–10.3)

## 2013-12-01 LAB — COMPREHENSIVE METABOLIC PANEL (CC13)
ALT: 30 U/L (ref 0–55)
AST: 29 U/L (ref 5–34)
Albumin: 3.5 g/dL (ref 3.5–5.0)
Alkaline Phosphatase: 97 U/L (ref 40–150)
Anion Gap: 12 mEq/L — ABNORMAL HIGH (ref 3–11)
BILIRUBIN TOTAL: 0.96 mg/dL (ref 0.20–1.20)
BUN: 30.4 mg/dL — AB (ref 7.0–26.0)
CALCIUM: 9.4 mg/dL (ref 8.4–10.4)
CHLORIDE: 105 meq/L (ref 98–109)
CO2: 18 meq/L — AB (ref 22–29)
Creatinine: 1.1 mg/dL (ref 0.7–1.3)
Glucose: 115 mg/dl (ref 70–140)
Potassium: 3.7 mEq/L (ref 3.5–5.1)
Sodium: 136 mEq/L (ref 136–145)
Total Protein: 7.2 g/dL (ref 6.4–8.3)

## 2013-12-01 MED ORDER — SODIUM CHLORIDE 0.9 % IV SOLN
1000.0000 mg/m2 | Freq: Once | INTRAVENOUS | Status: AC
  Administered 2013-12-01: 2204 mg via INTRAVENOUS
  Filled 2013-12-01: qty 57.97

## 2013-12-01 MED ORDER — PROCHLORPERAZINE MALEATE 10 MG PO TABS
ORAL_TABLET | ORAL | Status: AC
  Filled 2013-12-01: qty 1

## 2013-12-01 MED ORDER — SODIUM CHLORIDE 0.9 % IV SOLN
Freq: Once | INTRAVENOUS | Status: AC
  Administered 2013-12-01: 14:00:00 via INTRAVENOUS

## 2013-12-01 MED ORDER — PROCHLORPERAZINE MALEATE 10 MG PO TABS
10.0000 mg | ORAL_TABLET | Freq: Once | ORAL | Status: AC
  Administered 2013-12-01: 10 mg via ORAL

## 2013-12-01 NOTE — Patient Instructions (Signed)
[facility] Discharge Instructions for Patients Receiving Chemotherapy  Today you received the following chemotherapy agents gemzar.  To help prevent nausea and vomiting after your treatment, we encourage you to take your nausea medication compazine.   If you develop nausea and vomiting that is not controlled by your nausea medication, call the clinic.   BELOW ARE SYMPTOMS THAT SHOULD BE REPORTED IMMEDIATELY:  *FEVER GREATER THAN 100.5 F  *CHILLS WITH OR WITHOUT FEVER  NAUSEA AND VOMITING THAT IS NOT CONTROLLED WITH YOUR NAUSEA MEDICATION  *UNUSUAL SHORTNESS OF BREATH  *UNUSUAL BRUISING OR BLEEDING  TENDERNESS IN MOUTH AND THROAT WITH OR WITHOUT PRESENCE OF ULCERS  *URINARY PROBLEMS  *BOWEL PROBLEMS  UNUSUAL RASH Items with * indicate a potential emergency and should be followed up as soon as possible.  Feel free to call the clinic you have any questions or concerns. The clinic phone number is [phone].    

## 2013-12-01 NOTE — Progress Notes (Unsigned)
Ok for treatment today with CBC resutls WBC 2.7 and ANC 1.4, CMET with BUN of 30.4  VO Dr. Rosalyn Gess R Gemzar hung at 1434.  At 1450 pt. C/o burning with gemzar.  Rate decreased from 659ml/hr to 400 ml/hr.  Warm towels applied to site. Site looks good - no redness or swelling. Pt. Reports it feels much better.

## 2013-12-13 ENCOUNTER — Other Ambulatory Visit (HOSPITAL_BASED_OUTPATIENT_CLINIC_OR_DEPARTMENT_OTHER): Payer: Medicare Other

## 2013-12-13 ENCOUNTER — Ambulatory Visit (HOSPITAL_COMMUNITY)
Admission: RE | Admit: 2013-12-13 | Discharge: 2013-12-13 | Disposition: A | Discharge status: Home / Self Care | Payer: Medicare Other | Source: Ambulatory Visit | Attending: Oncology | Admitting: Oncology

## 2013-12-13 ENCOUNTER — Ambulatory Visit (HOSPITAL_COMMUNITY): Payer: Medicare Other

## 2013-12-13 DIAGNOSIS — C679 Malignant neoplasm of bladder, unspecified: Secondary | ICD-10-CM

## 2013-12-13 DIAGNOSIS — Z936 Other artificial openings of urinary tract status: Secondary | ICD-10-CM | POA: Insufficient documentation

## 2013-12-13 DIAGNOSIS — I700 Atherosclerosis of aorta: Secondary | ICD-10-CM | POA: Insufficient documentation

## 2013-12-13 DIAGNOSIS — M8448XA Pathological fracture, other site, initial encounter for fracture: Secondary | ICD-10-CM | POA: Insufficient documentation

## 2013-12-13 DIAGNOSIS — M949 Disorder of cartilage, unspecified: Secondary | ICD-10-CM

## 2013-12-13 DIAGNOSIS — M899 Disorder of bone, unspecified: Secondary | ICD-10-CM | POA: Insufficient documentation

## 2013-12-13 DIAGNOSIS — I2510 Atherosclerotic heart disease of native coronary artery without angina pectoris: Secondary | ICD-10-CM | POA: Insufficient documentation

## 2013-12-13 DIAGNOSIS — K4090 Unilateral inguinal hernia, without obstruction or gangrene, not specified as recurrent: Secondary | ICD-10-CM | POA: Insufficient documentation

## 2013-12-13 LAB — COMPREHENSIVE METABOLIC PANEL (CC13)
ALK PHOS: 101 U/L (ref 40–150)
ALT: 24 U/L (ref 0–55)
AST: 28 U/L (ref 5–34)
Albumin: 3.5 g/dL (ref 3.5–5.0)
Anion Gap: 15 mEq/L — ABNORMAL HIGH (ref 3–11)
BILIRUBIN TOTAL: 0.74 mg/dL (ref 0.20–1.20)
BUN: 15.6 mg/dL (ref 7.0–26.0)
CO2: 17 mEq/L — ABNORMAL LOW (ref 22–29)
Calcium: 9.2 mg/dL (ref 8.4–10.4)
Chloride: 106 mEq/L (ref 98–109)
Creatinine: 1 mg/dL (ref 0.7–1.3)
GLUCOSE: 92 mg/dL (ref 70–140)
Potassium: 3.9 mEq/L (ref 3.5–5.1)
Sodium: 138 mEq/L (ref 136–145)
Total Protein: 7.3 g/dL (ref 6.4–8.3)

## 2013-12-13 LAB — CBC WITH DIFFERENTIAL/PLATELET
BASO%: 0.3 % (ref 0.0–2.0)
Basophils Absolute: 0 10*3/uL (ref 0.0–0.1)
EOS%: 0.3 % (ref 0.0–7.0)
Eosinophils Absolute: 0 10*3/uL (ref 0.0–0.5)
HCT: 22.9 % — ABNORMAL LOW (ref 38.4–49.9)
HGB: 7.6 g/dL — ABNORMAL LOW (ref 13.0–17.1)
LYMPH%: 29.2 % (ref 14.0–49.0)
MCH: 29.4 pg (ref 27.2–33.4)
MCHC: 33.1 g/dL (ref 32.0–36.0)
MCV: 88.7 fL (ref 79.3–98.0)
MONO#: 0.9 10*3/uL (ref 0.1–0.9)
MONO%: 24 % — ABNORMAL HIGH (ref 0.0–14.0)
NEUT%: 46.2 % (ref 39.0–75.0)
NEUTROS ABS: 1.8 10*3/uL (ref 1.5–6.5)
Platelets: 112 10*3/uL — ABNORMAL LOW (ref 140–400)
RBC: 2.58 10*6/uL — ABNORMAL LOW (ref 4.20–5.82)
RDW: 16.7 % — AB (ref 11.0–14.6)
WBC: 3.9 10*3/uL — ABNORMAL LOW (ref 4.0–10.3)
lymph#: 1.1 10*3/uL (ref 0.9–3.3)

## 2013-12-13 LAB — TECHNOLOGIST REVIEW

## 2013-12-13 MED ORDER — IOHEXOL 300 MG/ML  SOLN
100.0000 mL | Freq: Once | INTRAMUSCULAR | Status: AC | PRN
  Administered 2013-12-13: 100 mL via INTRAVENOUS

## 2013-12-15 ENCOUNTER — Encounter: Payer: Self-pay | Admitting: Oncology

## 2013-12-15 ENCOUNTER — Ambulatory Visit (HOSPITAL_BASED_OUTPATIENT_CLINIC_OR_DEPARTMENT_OTHER): Payer: Medicare Other | Admitting: Oncology

## 2013-12-15 ENCOUNTER — Ambulatory Visit: Payer: Medicare Other

## 2013-12-15 VITALS — BP 123/68 | HR 71 | Temp 97.5°F | Resp 19 | Ht 72.0 in | Wt 204.6 lb

## 2013-12-15 DIAGNOSIS — Z8546 Personal history of malignant neoplasm of prostate: Secondary | ICD-10-CM

## 2013-12-15 DIAGNOSIS — C679 Malignant neoplasm of bladder, unspecified: Secondary | ICD-10-CM

## 2013-12-15 NOTE — Progress Notes (Signed)
Hematology and Oncology Follow Up Visit  Joe Park [id number] Nov 11, 1940 73 y.o. 12/15/2013 12:06 PM   Principle Diagnosis: 73 year old man diagnosed with bladder cancer in 09/2011.   Prior Therapy: He is S/P cystoprostatectomy and bilateral pelvic lymph node dissection and creation of an ileal conduit done on 11/11/2011. 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 The pathological stage was T3b N0.  Secondary Diagnosis: Prostate cancer diagnosed in 2002. Not active at this time.   Current therapy: Systemic chemotherapy with carboplatin for an AUC of 5 given on day 1 and gemcitabine at 1000 mg per meter square given on days 1 and 8 every 3 weeks. Here for cycle 4 day 1.  Interim History:  [name] returns today for a follow up visit prior to proceeding with day 1 of cycle 4 of his systemic chemotherapy. He continues to tolerate chemotherapy well with the exception of grade 1 nausea. This is well controlled with his current antiemetic. His pelvic pain has resolved since starting chemotherapy. He dnies any fever chills, no nausea vomiting or night sweats. Denies cough or hemoptysis or hematemesis. Denies abdominal pain no hematochezia or melena. Has not reported any genitourinary complaints. He declined headaches or blurry vision or double vision. Did not report any syncope or seizures. Did not report any chest pain shortness of breath or cough or hemoptysis. Did not report any hematochezia or melena. Does not report any urinary symptoms. Does not report any musculoskeletal complaints. Did not report any arthralgias myalgias or rash or pruritus. He is having pain in his hands where the IVs are being placed for his chemotherapy. Rest or view of system is unremarkable.   Medications: I have reviewed the patient's current medications. Current Outpatient Prescriptions  Medication  Sig Dispense Refill  . aspirin 81 MG chewable tablet Chew 81 mg by mouth daily with breakfast.       . Chlorhexidine Gluconate (PERIOGARD MT) Use as directed 0.5 oz in the mouth or throat at bedtime. For 2 months      . levothyroxine (SYNTHROID, LEVOTHROID) 125 MCG tablet Take 125 mcg by mouth daily with breakfast.       . lisinopril (PRINIVIL,ZESTRIL) 40 MG tablet Take 40 mg by mouth every morning.      . Multiple Vitamin (MULITIVITAMIN WITH MINERALS) TABS Take 1 tablet by mouth every morning.      . prochlorperazine (COMPAZINE) 10 MG tablet Take 1 tablet (10 mg total) by mouth every 6 (six) hours as needed for nausea or vomiting.  60 tablet  1  . simvastatin (ZOCOR) 40 MG tablet Take 40 mg by mouth at bedtime.      . traMADol (ULTRAM) 50 MG tablet Take 1 tablet by mouth daily.      . [DISCONTINUED] hydrALAZINE (APRESOLINE) 25 MG tablet Take 12.5 mg by mouth every morning.       No current facility-administered medications for this visit.    Allergies: No Known Allergies  Past Medical History, Surgical history, Social history, and Family History were reviewed and updated.    Physical Exam: Blood pressure 123/68, pulse 71, temperature 97.5 F (36.4 C), temperature source Oral, resp. rate 19, height 6' (1.829 m), weight 204 lb 9.6 oz (92.806 kg), SpO2 100.00%. ECOG: 1 General appearance: alert awake not in any distress. Head: Atraumatic no abnormalities Neck: no adenopathy, no thyroid masses. Lymph nodes: Cervical, supraclavicular, and axillary nodes  normal. Heart:regular rate and rhythm, S1, S2.  Lung:chest clear, no wheezing, rales, normal symmetric air entry Abdomen: soft, non-tender, without masses or organomegaly no ascites or shifting dullness. EXT:no erythema, induration, or nodules Neurological examination: No deficits at this time noted. He was able to ambulate without any difficulties.   Lab Results: Lab Results  Component Value Date   WBC 3.9* 12/13/2013   HGB 7.6*  12/13/2013   HCT 22.9* 12/13/2013   MCV 88.7 12/13/2013   PLT 112* 12/13/2013     Chemistry      Component Value Date/Time   NA 138 12/13/2013 1428   NA 140 02/24/2012 1249   K 3.9 12/13/2013 1428   K 3.9 02/24/2012 1249   CL 106 12/27/2012 0801   CL 110 02/24/2012 1249   CO2 17* 12/13/2013 1428   CO2 23 02/24/2012 1249   BUN 15.6 12/13/2013 1428   BUN 18 02/24/2012 1249   CREATININE 1.0 12/13/2013 1428   CREATININE 0.98 02/24/2012 1249      Component Value Date/Time   CALCIUM 9.2 12/13/2013 1428   CALCIUM 8.9 02/24/2012 1249   ALKPHOS 101 12/13/2013 1428   ALKPHOS 85 02/24/2012 1249   AST 28 12/13/2013 1428   AST 18 02/24/2012 1249   ALT 24 12/13/2013 1428   ALT 12 02/24/2012 1249   BILITOT 0.74 12/13/2013 1428   BILITOT 0.6 02/24/2012 1249     EXAM:  CT CHEST, ABDOMEN, AND PELVIS WITH CONTRAST  TECHNIQUE:  Multidetector CT imaging of the chest, abdomen and pelvis was  performed following the standard protocol during bolus  administration of intravenous contrast.  CONTRAST: 120mL OMNIPAQUE IOHEXOL 300 MG/ML SOLN  COMPARISON: 09/14/2013.  FINDINGS:  CT CHEST FINDINGS  Soft tissue / Mediastinum: There is no axillary lymphadenopathy. No  mediastinal hilar lymphadenopathy. The thoracic esophagus is within  normal limits. Heart size is normal. Coronary artery calcification  is noted. No pericardial effusion.  Lungs / Pleura: Tiny nodule in the left major fissure (image 36) is  unchanged in the interval. Tiny nodules along the right major  fissure (image 29 and 31) are also stable. No focal airspace  consolidation. No evidence for pulmonary edema.  Bones: Bone windows reveal no worrisome lytic or sclerotic osseous  lesions.  CT ABDOMEN AND PELVIS FINDINGS  Liver: No focal abnormality.  Spleen: Normal  Stomach: The stomach is within normal limits.  Pancreas: No focal mass lesion. No dilatation of the main duct.  Gallbladder/Biliary Tree: No evidence for gallstones. There is no  intra or extrahepatic  biliary duct dilatation.  Kidneys/Adrenals: No adrenal nodule or mass. Multiple tiny bilateral  low-density renal lesions are stable. Most of these were visible on  a study dating back to 02/17/2010, and air most likely related to  tiny renal cysts.  Bowel Loops: Duodenum is normal. No small bowel obstruction. Right  lower quadrant ileal conduit is again noted with is peristomal  herniation of some fat at the urostomy. Terminal ileum is normal. A  few scattered diverticuli are noted in the left colon without  evidence of diverticulitis.  Nodes: No pelvic sidewall lymphadenopathy. No retroperitoneal  lymphadenopathy in the abdomen. No intraperitoneal abdominal  lymphadenopathy.  Vasculature: Atherosclerotic calcification is noted in the wall of  the abdominal aorta without aneurysm.  Pelvic Genitourinary: Patient is status post cystectomy.  Brachytherapy seeds are seen in the prostate gland. The low  attenuation lesion in the pelvic 4, involving the mid anterior  prostate gland, penile base, and right  adductor musculature  persists. Given the irregular shaped, reproducible measurement is  difficult, but this does not appear substantially changed. Measuring  in a similar plane and in similar dimensions as on the previous  study, the lesion measures 8.0 x 7.2 cm today compared to 7.7 x 7.6  cm previously.  Bones/Musculoskeletal: There is some cortical irregularity and bone  loss in the right inferior pubic ramus adjacent to the lesion  described above and a new fracture is seen in the right superior  pubic ramus.  Body Wall: Right inguinal hernia contains only fat, stable.  Other: The omental nodularity seen in the region of the stoma on the  previous exam has resolved in the interval.  IMPRESSION:  Hypo attenuating lesion in the anterior pelvic floor persists  without substantial change although there has been interval  development of cortical irregularity and demineralization  involving  the inferior pubic ramus and what appears to be an associated  pathologic fracture in the inferior ramus. There is also a new  fracture of the superior pubic ramus just anterior to the  acetabulum.  The omental nodularity which was seen on the previous study has  resolved in the interval.  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 He developed pelvic recurrence and he is currently being treated with systemic chemotherapy with carboplatin and gemcitabine. CT scan on 12/13/2013 was discussed today and showed stable disease. I plan on continuing with 3 more cycles of chemotherapy and repeat imaging studies at that time. His counts are inadequate for chemotherapy today and will delay him for 2 more weeks.  2. Pelvic discomfort: This could be related to this infiltrative mass. Pain is resolved now appear  3. Anti-emetics: He has Compazine at home.  4. Weight loss. I have encouraged him to drink 2-3 cans of Ensure per day. His weight is improved by 4 pounds since the last visit.  5. IV access: Risks and benefits of inserting a Port-A-Cath was discussed including bleeding, infection and thrombosis. He is agreeable to proceed and we'll set that up for him before the next chemotherapy appointment  6. Followup: He will followup on 6/19 to start cycle 4 of chemotherapy and I will evaluate him on 6/26.  Wyatt Portela  6/5/201512:06 PM

## 2013-12-18 ENCOUNTER — Telehealth: Payer: Self-pay | Admitting: Oncology

## 2013-12-18 ENCOUNTER — Encounter (HOSPITAL_COMMUNITY): Payer: Self-pay | Admitting: Pharmacy Technician

## 2013-12-18 NOTE — Telephone Encounter (Signed)
s.w. pt and advised on June appt...sed added tx....pt ok and aware

## 2013-12-19 ENCOUNTER — Other Ambulatory Visit: Payer: Self-pay | Admitting: Radiology

## 2013-12-20 ENCOUNTER — Other Ambulatory Visit (HOSPITAL_COMMUNITY): Payer: Self-pay

## 2013-12-20 ENCOUNTER — Encounter (HOSPITAL_COMMUNITY): Payer: Self-pay

## 2013-12-20 ENCOUNTER — Ambulatory Visit (HOSPITAL_COMMUNITY)
Admission: RE | Admit: 2013-12-20 | Discharge: 2013-12-20 | Disposition: A | Discharge status: Home / Self Care | Payer: Medicare Other | Source: Ambulatory Visit | Attending: Oncology | Admitting: Oncology

## 2013-12-20 ENCOUNTER — Other Ambulatory Visit: Payer: Self-pay | Admitting: Oncology

## 2013-12-20 VITALS — BP 114/63 | HR 74 | Temp 98.3°F | Resp 17 | Ht 71.5 in | Wt 202.0 lb

## 2013-12-20 DIAGNOSIS — C679 Malignant neoplasm of bladder, unspecified: Secondary | ICD-10-CM

## 2013-12-20 LAB — CBC WITH DIFFERENTIAL/PLATELET
Basophils Absolute: 0 10*3/uL (ref 0.0–0.1)
Basophils Relative: 1 % (ref 0–1)
Eosinophils Absolute: 0 10*3/uL (ref 0.0–0.7)
Eosinophils Relative: 0 % (ref 0–5)
HEMATOCRIT: 26 % — AB (ref 39.0–52.0)
HEMOGLOBIN: 8.4 g/dL — AB (ref 13.0–17.0)
LYMPHS PCT: 24 % (ref 12–46)
Lymphs Abs: 0.9 10*3/uL (ref 0.7–4.0)
MCH: 29.6 pg (ref 26.0–34.0)
MCHC: 32.3 g/dL (ref 30.0–36.0)
MCV: 91.5 fL (ref 78.0–100.0)
Monocytes Absolute: 1 10*3/uL (ref 0.1–1.0)
Monocytes Relative: 25 % — ABNORMAL HIGH (ref 3–12)
NEUTROS ABS: 1.9 10*3/uL (ref 1.7–7.7)
Neutrophils Relative %: 50 % (ref 43–77)
Platelets: 285 10*3/uL (ref 150–400)
RBC: 2.84 MIL/uL — AB (ref 4.22–5.81)
RDW: 22.5 % — ABNORMAL HIGH (ref 11.5–15.5)
WBC: 3.8 10*3/uL — ABNORMAL LOW (ref 4.0–10.5)

## 2013-12-20 LAB — PROTIME-INR
INR: 1.15 (ref 0.00–1.49)
Prothrombin Time: 14.5 seconds (ref 11.6–15.2)

## 2013-12-20 LAB — APTT: APTT: 27 s (ref 24–37)

## 2013-12-20 MED ORDER — SODIUM CHLORIDE 0.9 % IV SOLN
INTRAVENOUS | Status: DC
  Administered 2013-12-20: 12:00:00 via INTRAVENOUS

## 2013-12-20 MED ORDER — HEPARIN SOD (PORK) LOCK FLUSH 100 UNIT/ML IV SOLN
INTRAVENOUS | Status: DC
Start: 2013-12-20 — End: 2013-12-21
  Filled 2013-12-20: qty 5

## 2013-12-20 MED ORDER — CEFAZOLIN SODIUM-DEXTROSE 2-3 GM-% IV SOLR
2.0000 g | Freq: Once | INTRAVENOUS | Status: AC
  Administered 2013-12-20: 2 g via INTRAVENOUS
  Filled 2013-12-20: qty 50

## 2013-12-20 MED ORDER — LIDOCAINE-EPINEPHRINE (PF) 2 %-1:200000 IJ SOLN
INTRAMUSCULAR | Status: AC
  Filled 2013-12-20: qty 20

## 2013-12-20 MED ORDER — MIDAZOLAM HCL 2 MG/2ML IJ SOLN
INTRAMUSCULAR | Status: AC
  Filled 2013-12-20: qty 6

## 2013-12-20 MED ORDER — FENTANYL CITRATE 0.05 MG/ML IJ SOLN
INTRAMUSCULAR | Status: AC
  Filled 2013-12-20: qty 6

## 2013-12-20 MED ORDER — HEPARIN SOD (PORK) LOCK FLUSH 100 UNIT/ML IV SOLN
INTRAVENOUS | Status: AC | PRN
  Administered 2013-12-20: 500 [IU]

## 2013-12-20 MED ORDER — FENTANYL CITRATE 0.05 MG/ML IJ SOLN
INTRAMUSCULAR | Status: AC | PRN
  Administered 2013-12-20 (×2): 25 ug via INTRAVENOUS

## 2013-12-20 MED ORDER — LIDOCAINE HCL 1 % IJ SOLN
INTRAMUSCULAR | Status: AC
  Filled 2013-12-20: qty 20

## 2013-12-20 MED ORDER — MIDAZOLAM HCL 2 MG/2ML IJ SOLN
INTRAMUSCULAR | Status: AC | PRN
  Administered 2013-12-20 (×2): 1 mg via INTRAVENOUS
  Administered 2013-12-20: 0.5 mg via INTRAVENOUS

## 2013-12-20 NOTE — Discharge Instructions (Signed)
Moderate Sedation, Adult °Moderate sedation is given to help you relax or even sleep through a procedure. You may remain sleepy, be clumsy, or have poor balance for several hours following this procedure. Arrange for a responsible adult, family member, or friend to take you home. A responsible adult should stay with you for at least 24 hours or until the medicines have worn off. °· Do not participate in any activities where you could become injured for the next 24 hours, or until you feel normal again. Do not: °· Drive. °· Swim. °· Ride a bicycle. °· Operate heavy machinery. °· Cook. °· Use power tools. °· Climb ladders. °· Work at heights. °· Do not make important decisions or sign legal documents until you are improved. °· Vomiting may occur if you eat too soon. When you can drink without vomiting, try water, juice, or soup. Try solid foods if you feel little or no nausea. °· Only take over-the-counter or prescription medications for pain, discomfort, or fever as directed by your caregiver.If pain medications have been prescribed for you, ask your caregiver how soon it is safe to take them. °· Make sure you and your family fully understands everything about the medication given to you. Make sure you understand what side effects may occur. °· You should not drink alcohol, take sleeping pills, or medications that cause drowsiness for at least 24 hours. °· If you smoke, do not smoke alone. °· If you are feeling better, you may resume normal activities 24 hours after receiving sedation. °· Keep all appointments as scheduled. Follow all instructions. °· Ask questions if you do not understand. °SEEK MEDICAL CARE IF:  °· Your skin is pale or bluish in color. °· You continue to feel sick to your stomach (nauseous) or throw up (vomit). °· Your pain is getting worse and not helped by medication. °· You have bleeding or swelling. °· You are still sleepy or feeling clumsy after 24 hours. °SEEK IMMEDIATE MEDICAL CARE IF:   °· You develop a rash. °· You have difficulty breathing. °· You develop any type of allergic problem. °· You have a fever. °Document Released: 03/24/2001 Document Revised: 09/21/2011 Document Reviewed: 03/06/2013 °Exit[employer]® Patient Information ©2014 [employer]. °Implanted Port Home Guide °An implanted port is a type of central line that is placed under the skin. Central lines are used to provide IV access when treatment or nutrition needs to be given through a person's veins. Implanted ports are used for long-term IV access. An implanted port may be placed because:  °· You need IV medicine that would be irritating to the small veins in your hands or arms.   °· You need long-term IV medicines, such as antibiotics.   °· You need IV nutrition for a long period.   °· You need frequent blood draws for lab tests.   °· You need dialysis.   °Implanted ports are usually placed in the chest area, but they can also be placed in the upper arm, the abdomen, or the leg. An implanted port has two main parts:  °· Reservoir. The reservoir is round and will appear as a small, raised area under your skin. The reservoir is the part where a needle is inserted to give medicines or draw blood.   °· Catheter. The catheter is a thin, flexible tube that extends from the reservoir. The catheter is placed into a large vein. Medicine that is inserted into the reservoir goes into the catheter and then into the vein.   °HOW WILL I CARE FOR MY   INCISION SITE? °Do not get the incision site wet. Bathe or shower as directed by your health care provider.  °HOW IS MY PORT ACCESSED? °Special steps must be taken to access the port:  °· Before the port is accessed, a numbing cream can be placed on the skin. This helps numb the skin over the port site.   °· Your health care provider uses a sterile technique to access the port. °· Your health care provider must put on a mask and sterile gloves. °· The skin over your port is cleaned carefully with an  antiseptic and allowed to dry. °· The port is gently pinched between sterile gloves, and a needle is inserted into the port. °· Only "non-coring" port needles should be used to access the port. Once the port is accessed, a blood return should be checked. This helps ensure that the port is in the vein and is not clogged.   °· If your port needs to remain accessed for a constant infusion, a clear (transparent) bandage will be placed over the needle site. The bandage and needle will need to be changed every week, or as directed by your health care provider.   °· Keep the bandage covering the needle clean and dry. Do not get it wet. Follow your health care provider's instructions on how to take a shower or bath while the port is accessed.   °· If your port does not need to stay accessed, no bandage is needed over the port.   °WHAT IS FLUSHING? °Flushing helps keep the port from getting clogged. Follow your health care provider's instructions on how and when to flush the port. Ports are usually flushed with saline solution or a medicine called heparin. The need for flushing will depend on how the port is used.  °· If the port is used for intermittent medicines or blood draws, the port will need to be flushed:   °· After medicines have been given.   °· After blood has been drawn.   °· As part of routine maintenance.   °· If a constant infusion is running, the port may not need to be flushed.   °HOW LONG WILL MY PORT STAY IMPLANTED? °The port can stay in for as long as your health care provider thinks it is needed. When it is time for the port to come out, surgery will be done to remove it. The procedure is similar to the one performed when the port was put in.  °WHEN SHOULD I SEEK IMMEDIATE MEDICAL CARE? °When you have an implanted port, you should seek immediate medical care if:  °· You notice a bad smell coming from the incision site.   °· You have swelling, redness, or drainage at the incision site.   °· You have more  swelling or pain at the port site or the surrounding area.   °· You have a fever that is not controlled with medicine. °Document Released: 06/29/2005 Document Revised: 04/19/2013 Document Reviewed: 03/06/2013 °Exit[employer]® Patient Information ©2014 [employer]. ° °

## 2013-12-20 NOTE — Procedures (Signed)
Interventional Radiology Procedure Note  Procedure: Placement of a right IJ approach single lumen PowerPort.  Tip is positioned at the superior cavoatrial junction and catheter is ready for immediate use.  Complications: No immediate Recommendations:  - Ok to shower tomorrow - Do not submerge for 7 days - Routine line care   Signed,  Heath K. McCullough, MD [facility]  Radiology   

## 2013-12-20 NOTE — H&P (Signed)
Chief Complaint: "I'm here for a port" Referring Physician:Shadad HPI: Joe Park is an 73 y.o. male with hx of recurrent bladder cancer. He is receiving chemotherapy but has had a difficult time with IV access recently. He is now scheduled for port placement PMHx, meds, labs reviewed.  Past Medical History:  Past Medical History  Diagnosis Date  . Hypertension   . Elevated cholesterol   . GERD (gastroesophageal reflux disease)   . Bladder tumor   . Difficulty urinating   . Hypothyroidism     UNSURE IF TOO LOW OR TOO HIGH  . Difficulty sleeping   . Speech impediment     SINCE BIRTH  . Sleep apnea     STOP BANG SCORE 5  . Cancer     HX PROSTATE CANCER AND BLADDER CANCER    Past Surgical History:  Past Surgical History  Procedure Laterality Date  . Cataracts  2012    RT CATARACT REMOVED  . Radioactive seed implant  1990'S    PROSTATE CANCER  . Hernia repair  1980'S  . Transurethral resection of bladder tumor  09/22/2011    Procedure: TRANSURETHRAL RESECTION OF BLADDER TUMOR (TURBT);  Surgeon: Malka So, MD;  Location: [facility];  Service: Urology;  Laterality: N/A;  Examination Under Anesthesia/TUR-BT with Gyrus  . Cystectomy  11/11/2011    Procedure: CYSTECTOMY COMPLETE;  Surgeon: Franchot Gallo, MD;  Location: [facility];  Service: Urology;  Laterality: N/A;  RADICAL CYSTECTOMY WITH NODE DISSECTION AND ILEAL CONDUIT   . Lymph node dissection  11/11/2011    Procedure: LYMPH NODE DISSECTION;  Surgeon: Franchot Gallo, MD;  Location: [facility];  Service: Urology;  Laterality: N/A;    Family History: History reviewed. No pertinent family history.  Social History:  reports that he has never smoked. He has never used smokeless tobacco. He reports that he does not drink alcohol or use illicit drugs.  Allergies: No Known Allergies  Medications:   Medication List    ASK your doctor about these medications       aspirin 81 MG chewable tablet  Chew 81 mg by mouth daily  with breakfast.     levothyroxine 125 MCG tablet  Commonly known as:  SYNTHROID, LEVOTHROID  Take 125 mcg by mouth daily with breakfast.     lisinopril 40 MG tablet  Commonly known as:  PRINIVIL,ZESTRIL  Take 40 mg by mouth every morning.     multivitamin with minerals Tabs tablet  Take 1 tablet by mouth every morning.     PERIOGARD MT  Use as directed 0.5 oz in the mouth or throat at bedtime.     prochlorperazine 10 MG tablet  Commonly known as:  COMPAZINE  Take 1 tablet (10 mg total) by mouth every 6 (six) hours as needed for nausea or vomiting.     simvastatin 40 MG tablet  Commonly known as:  ZOCOR  Take 40 mg by mouth at bedtime.     traMADol 50 MG tablet  Commonly known as:  ULTRAM  Take 1 tablet by mouth daily as needed for moderate pain.        Please HPI for pertinent positives, otherwise complete 10 system ROS negative.  Physical Exam: BP 118/70  Pulse 75  Temp(Src) 98.1 F (36.7 C) (Oral)  Resp 18  Ht 5' 11.5" (1.816 m)  Wt 202 lb (91.627 kg)  BMI 27.78 kg/m2  SpO2 98% Body mass index is 27.78 kg/(m^2).   General Appearance:  Alert, cooperative,  no distress, appears stated age  Head:  Normocephalic, without obvious abnormality, atraumatic  ENT: Unremarkable  Neck: Supple, symmetrical, trachea midline  Lungs:   Clear to auscultation bilaterally, no w/r/r, respirations unlabored without use of accessory muscles.  Chest Wall:  No tenderness or deformity  Heart:  Regular rate and rhythm, S1, S2 normal, no murmur, rub or gallop.  Neurologic: Normal affect, no gross deficits.   Results for orders placed during the hospital encounter of 12/20/13 (from the past 48 hour(s))  APTT     Status: None   Collection Time    12/20/13 12:00 PM      Result Value Ref Range   aPTT 27  24 - 37 seconds  CBC WITH DIFFERENTIAL     Status: Abnormal (Preliminary result)   Collection Time    12/20/13 12:00 PM      Result Value Ref Range   WBC 3.8 (*) 4.0 - 10.5 K/uL    RBC 2.84 (*) 4.22 - 5.81 MIL/uL   Hemoglobin 8.4 (*) 13.0 - 17.0 g/dL   HCT 26.0 (*) 39.0 - 52.0 %   MCV 91.5  78.0 - 100.0 fL   MCH 29.6  26.0 - 34.0 pg   MCHC 32.3  30.0 - 36.0 g/dL   RDW 22.5 (*) 11.5 - 15.5 %   Platelets 285  150 - 400 K/uL   Neutrophils Relative % PENDING  43 - 77 %   Neutro Abs PENDING  1.7 - 7.7 K/uL   Band Neutrophils PENDING  0 - 10 %   Lymphocytes Relative PENDING  12 - 46 %   Lymphs Abs PENDING  0.7 - 4.0 K/uL   Monocytes Relative PENDING  3 - 12 %   Monocytes Absolute PENDING  0.1 - 1.0 K/uL   Eosinophils Relative PENDING  0 - 5 %   Eosinophils Absolute PENDING  0.0 - 0.7 K/uL   Basophils Relative PENDING  0 - 1 %   Basophils Absolute PENDING  0.0 - 0.1 K/uL   WBC Morphology PENDING     RBC Morphology PENDING     Smear Review PENDING     nRBC PENDING  0 /100 WBC   Metamyelocytes Relative PENDING     Myelocytes PENDING     Promyelocytes Absolute PENDING     Blasts PENDING    PROTIME-INR     Status: None   Collection Time    12/20/13 12:00 PM      Result Value Ref Range   Prothrombin Time 14.5  11.6 - 15.2 seconds   INR 1.15  0.00 - 1.49   No results found.  Assessment/Plan Bladder cancer For Port placement Discussed procedure, risks, complications, use of sedation Labs reviewed Consent signed in chart  Ascencion Dike PA-C 12/20/2013, 12:57 PM

## 2013-12-22 ENCOUNTER — Ambulatory Visit: Payer: Medicare Other

## 2013-12-22 ENCOUNTER — Other Ambulatory Visit: Payer: Medicare Other

## 2013-12-29 ENCOUNTER — Other Ambulatory Visit (HOSPITAL_BASED_OUTPATIENT_CLINIC_OR_DEPARTMENT_OTHER): Payer: Medicare Other

## 2013-12-29 ENCOUNTER — Other Ambulatory Visit: Payer: Self-pay | Admitting: *Deleted

## 2013-12-29 ENCOUNTER — Ambulatory Visit (HOSPITAL_BASED_OUTPATIENT_CLINIC_OR_DEPARTMENT_OTHER): Payer: Medicare Other

## 2013-12-29 VITALS — BP 117/68 | HR 60 | Temp 97.0°F

## 2013-12-29 DIAGNOSIS — Z5111 Encounter for antineoplastic chemotherapy: Secondary | ICD-10-CM

## 2013-12-29 DIAGNOSIS — C679 Malignant neoplasm of bladder, unspecified: Secondary | ICD-10-CM

## 2013-12-29 LAB — CBC WITH DIFFERENTIAL/PLATELET
BASO%: 0.8 % (ref 0.0–2.0)
Basophils Absolute: 0 10*3/uL (ref 0.0–0.1)
EOS ABS: 0 10*3/uL (ref 0.0–0.5)
EOS%: 0.4 % (ref 0.0–7.0)
HCT: 30.2 % — ABNORMAL LOW (ref 38.4–49.9)
HEMOGLOBIN: 9.8 g/dL — AB (ref 13.0–17.1)
LYMPH#: 0.9 10*3/uL (ref 0.9–3.3)
LYMPH%: 17.8 % (ref 14.0–49.0)
MCH: 30.7 pg (ref 27.2–33.4)
MCHC: 32.4 g/dL (ref 32.0–36.0)
MCV: 94.6 fL (ref 79.3–98.0)
MONO#: 0.8 10*3/uL (ref 0.1–0.9)
MONO%: 14.4 % — ABNORMAL HIGH (ref 0.0–14.0)
NEUT%: 66.6 % (ref 39.0–75.0)
NEUTROS ABS: 3.5 10*3/uL (ref 1.5–6.5)
Platelets: 258 10*3/uL (ref 140–400)
RBC: 3.19 10*6/uL — ABNORMAL LOW (ref 4.20–5.82)
RDW: 22.8 % — AB (ref 11.0–14.6)
WBC: 5.2 10*3/uL (ref 4.0–10.3)

## 2013-12-29 LAB — COMPREHENSIVE METABOLIC PANEL (CC13)
ALBUMIN: 3.7 g/dL (ref 3.5–5.0)
ALK PHOS: 86 U/L (ref 40–150)
ALT: 17 U/L (ref 0–55)
AST: 24 U/L (ref 5–34)
Anion Gap: 9 mEq/L (ref 3–11)
BUN: 15.9 mg/dL (ref 7.0–26.0)
CO2: 23 mEq/L (ref 22–29)
Calcium: 9.7 mg/dL (ref 8.4–10.4)
Chloride: 108 mEq/L (ref 98–109)
Creatinine: 1 mg/dL (ref 0.7–1.3)
Glucose: 114 mg/dl (ref 70–140)
POTASSIUM: 3.5 meq/L (ref 3.5–5.1)
Sodium: 141 mEq/L (ref 136–145)
TOTAL PROTEIN: 7.3 g/dL (ref 6.4–8.3)
Total Bilirubin: 0.86 mg/dL (ref 0.20–1.20)

## 2013-12-29 MED ORDER — DEXAMETHASONE SODIUM PHOSPHATE 20 MG/5ML IJ SOLN
20.0000 mg | Freq: Once | INTRAMUSCULAR | Status: AC
  Administered 2013-12-29: 20 mg via INTRAVENOUS

## 2013-12-29 MED ORDER — LIDOCAINE-PRILOCAINE 2.5-2.5 % EX CREA: 1.0000 "application " | TOPICAL_CREAM | CUTANEOUS | Status: AC | PRN

## 2013-12-29 MED ORDER — SODIUM CHLORIDE 0.9 % IV SOLN
1000.0000 mg/m2 | Freq: Once | INTRAVENOUS | Status: AC
  Administered 2013-12-29: 2204 mg via INTRAVENOUS
  Filled 2013-12-29: qty 57.97

## 2013-12-29 MED ORDER — DEXAMETHASONE SODIUM PHOSPHATE 20 MG/5ML IJ SOLN
INTRAMUSCULAR | Status: AC
  Filled 2013-12-29: qty 5

## 2013-12-29 MED ORDER — SODIUM CHLORIDE 0.9 % IV SOLN
Freq: Once | INTRAVENOUS | Status: AC
  Administered 2013-12-29: 14:00:00 via INTRAVENOUS

## 2013-12-29 MED ORDER — ONDANSETRON 16 MG/50ML IVPB (CHCC)
INTRAVENOUS | Status: AC
  Filled 2013-12-29: qty 16

## 2013-12-29 MED ORDER — ONDANSETRON 16 MG/50ML IVPB (CHCC)
16.0000 mg | Freq: Once | INTRAVENOUS | Status: AC
  Administered 2013-12-29: 16 mg via INTRAVENOUS

## 2013-12-29 MED ORDER — LIDOCAINE-PRILOCAINE 2.5-2.5 % EX CREA: 1.0000 "application " | TOPICAL_CREAM | CUTANEOUS | Status: DC | PRN

## 2013-12-29 MED ORDER — HEPARIN SOD (PORK) LOCK FLUSH 100 UNIT/ML IV SOLN
500.0000 [IU] | Freq: Once | INTRAVENOUS | Status: AC | PRN
  Administered 2013-12-29: 500 [IU]
  Filled 2013-12-29: qty 5

## 2013-12-29 MED ORDER — SODIUM CHLORIDE 0.9 % IV SOLN
582.0000 mg | Freq: Once | INTRAVENOUS | Status: AC
  Administered 2013-12-29: 580 mg via INTRAVENOUS
  Filled 2013-12-29: qty 58

## 2013-12-29 MED ORDER — SODIUM CHLORIDE 0.9 % IJ SOLN
10.0000 mL | INTRAMUSCULAR | Status: DC | PRN
  Administered 2013-12-29: 10 mL
  Filled 2013-12-29: qty 10

## 2013-12-29 NOTE — Patient Instructions (Signed)
[facility] Discharge Instructions for Patients Receiving Chemotherapy  Today you received the following chemotherapy agents Gemzar/Carboplatin.  To help prevent nausea and vomiting after your treatment, we encourage you to take your nausea medication as prescribed.   If you develop nausea and vomiting that is not controlled by your nausea medication, call the clinic.   BELOW ARE SYMPTOMS THAT SHOULD BE REPORTED IMMEDIATELY:  *FEVER GREATER THAN 100.5 F  *CHILLS WITH OR WITHOUT FEVER  NAUSEA AND VOMITING THAT IS NOT CONTROLLED WITH YOUR NAUSEA MEDICATION  *UNUSUAL SHORTNESS OF BREATH  *UNUSUAL BRUISING OR BLEEDING  TENDERNESS IN MOUTH AND THROAT WITH OR WITHOUT PRESENCE OF ULCERS  *URINARY PROBLEMS  *BOWEL PROBLEMS  UNUSUAL RASH Items with * indicate a potential emergency and should be followed up as soon as possible.  Feel free to call the clinic you have any questions or concerns. The clinic phone number is [phone].    

## 2014-01-05 ENCOUNTER — Other Ambulatory Visit (HOSPITAL_BASED_OUTPATIENT_CLINIC_OR_DEPARTMENT_OTHER): Payer: Medicare Other

## 2014-01-05 ENCOUNTER — Ambulatory Visit: Payer: Medicare Other

## 2014-01-05 ENCOUNTER — Telehealth: Payer: Self-pay | Admitting: Oncology

## 2014-01-05 ENCOUNTER — Ambulatory Visit (HOSPITAL_BASED_OUTPATIENT_CLINIC_OR_DEPARTMENT_OTHER): Payer: Medicare Other | Admitting: Oncology

## 2014-01-05 ENCOUNTER — Telehealth: Payer: Self-pay | Admitting: *Deleted

## 2014-01-05 ENCOUNTER — Encounter: Payer: Self-pay | Admitting: Oncology

## 2014-01-05 VITALS — BP 118/68 | HR 69 | Temp 97.6°F | Resp 18 | Ht 71.0 in | Wt 205.3 lb

## 2014-01-05 DIAGNOSIS — C679 Malignant neoplasm of bladder, unspecified: Secondary | ICD-10-CM

## 2014-01-05 DIAGNOSIS — Z95828 Presence of other vascular implants and grafts: Secondary | ICD-10-CM

## 2014-01-05 DIAGNOSIS — C670 Malignant neoplasm of trigone of bladder: Secondary | ICD-10-CM

## 2014-01-05 LAB — COMPREHENSIVE METABOLIC PANEL (CC13)
ALT: 23 U/L (ref 0–55)
ANION GAP: 9 meq/L (ref 3–11)
AST: 27 U/L (ref 5–34)
Albumin: 3.4 g/dL — ABNORMAL LOW (ref 3.5–5.0)
Alkaline Phosphatase: 87 U/L (ref 40–150)
BUN: 26.8 mg/dL — ABNORMAL HIGH (ref 7.0–26.0)
CO2: 21 meq/L — AB (ref 22–29)
Calcium: 9.2 mg/dL (ref 8.4–10.4)
Chloride: 109 mEq/L (ref 98–109)
Creatinine: 0.9 mg/dL (ref 0.7–1.3)
Glucose: 112 mg/dl (ref 70–140)
POTASSIUM: 3.8 meq/L (ref 3.5–5.1)
Sodium: 139 mEq/L (ref 136–145)
TOTAL PROTEIN: 6.9 g/dL (ref 6.4–8.3)
Total Bilirubin: 0.71 mg/dL (ref 0.20–1.20)

## 2014-01-05 LAB — CBC WITH DIFFERENTIAL/PLATELET
BASO%: 1.6 % (ref 0.0–2.0)
Basophils Absolute: 0 10*3/uL (ref 0.0–0.1)
EOS ABS: 0 10*3/uL (ref 0.0–0.5)
EOS%: 1.1 % (ref 0.0–7.0)
HCT: 25 % — ABNORMAL LOW (ref 38.4–49.9)
HEMOGLOBIN: 8.3 g/dL — AB (ref 13.0–17.1)
LYMPH%: 40.8 % (ref 14.0–49.0)
MCH: 31.2 pg (ref 27.2–33.4)
MCHC: 33.2 g/dL (ref 32.0–36.0)
MCV: 93.9 fL (ref 79.3–98.0)
MONO#: 0.1 10*3/uL (ref 0.1–0.9)
MONO%: 4.6 % (ref 0.0–14.0)
NEUT%: 51.9 % (ref 39.0–75.0)
NEUTROS ABS: 0.8 10*3/uL — AB (ref 1.5–6.5)
PLATELETS: 126 10*3/uL — AB (ref 140–400)
RBC: 2.66 10*6/uL — AB (ref 4.20–5.82)
RDW: 20.1 % — ABNORMAL HIGH (ref 11.0–14.6)
WBC: 1.5 10*3/uL — ABNORMAL LOW (ref 4.0–10.3)
lymph#: 0.6 10*3/uL — ABNORMAL LOW (ref 0.9–3.3)

## 2014-01-05 MED ORDER — SODIUM CHLORIDE 0.9 % IJ SOLN
10.0000 mL | INTRAMUSCULAR | Status: DC | PRN
  Administered 2014-01-05: 10 mL via INTRAVENOUS
  Filled 2014-01-05: qty 10

## 2014-01-05 NOTE — Telephone Encounter (Signed)
Gave pt appt for lab,md and chemo for July 2015 °

## 2014-01-05 NOTE — Telephone Encounter (Signed)
Per staff phone call and POF I have schedueld appts. Scheduler advised of appts.  JMW  

## 2014-01-05 NOTE — Progress Notes (Signed)
Hematology and Oncology Follow Up Visit  Joe Park [id number] [birth year] 73 y.o. 01/05/2014 9:50 AM   Principle Diagnosis: 73 year old man diagnosed with bladder cancer in 09/2011. Now he has recurrent advanced disease.  Prior Therapy: He is S/P cystoprostatectomy and bilateral pelvic lymph node dissection and creation of an ileal conduit done on 11/11/2011. 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 The pathological stage was T3b N0.  Secondary Diagnosis: Prostate cancer diagnosed in 2002. Not active at this time.   Current therapy: Systemic chemotherapy with carboplatin for an AUC of 5 given on day 1 and gemcitabine at 1000 mg per meter square given on days 1 and 8 every 3 weeks. Here for cycle 4 day 14.  Interim History:  [name] returns today for a follow up visit prior to his next treatment of systemic chemotherapy. He continues to tolerate chemotherapy well. His not as he has been controlled with his current antiemetic. His pelvic pain has resolved since starting chemotherapy. He is able to ambulate without any difficulty. He is able to eat better and his quality of life has improved. He dnies any fever chills, no nausea vomiting or night sweats. Denies cough or hemoptysis or hematemesis. Denies abdominal pain no hematochezia or melena. Has not reported any genitourinary complaints. He declined headaches or blurry vision or double vision. Did not report any syncope or seizures. Did not report any chest pain shortness of breath or cough or hemoptysis. Did not report any hematochezia or melena. Does not report any urinary symptoms. Does not report any musculoskeletal complaints. Did not report any arthralgias myalgias or rash or pruritus. He is having pain in his hands where the IVs are being placed for his chemotherapy. Rest or view of system is unremarkable.    Medications: I have reviewed the patient's current medications. Current Outpatient Prescriptions  Medication Sig Dispense Refill  . aspirin 81 MG chewable tablet Chew 81 mg by mouth daily with breakfast.       . Chlorhexidine Gluconate (PERIOGARD MT) Use as directed 0.5 oz in the mouth or throat at bedtime.       Marland Kitchen levothyroxine (SYNTHROID, LEVOTHROID) 125 MCG tablet Take 125 mcg by mouth daily with breakfast.       . lidocaine-prilocaine (EMLA) cream Apply 1 application topically as needed. Apply approx 1/2 tsp to skin, over port-a-cath prior to chemotherapy treatments.  30 g  2  . lisinopril (PRINIVIL,ZESTRIL) 40 MG tablet Take 40 mg by mouth every morning.      . Multiple Vitamin (MULITIVITAMIN WITH MINERALS) TABS Take 1 tablet by mouth every morning.      . prochlorperazine (COMPAZINE) 10 MG tablet Take 1 tablet (10 mg total) by mouth every 6 (six) hours as needed for nausea or vomiting.  60 tablet  1  . simvastatin (ZOCOR) 40 MG tablet Take 40 mg by mouth at bedtime.      . traMADol (ULTRAM) 50 MG tablet Take 1 tablet by mouth daily as needed for moderate pain.       . [DISCONTINUED] hydrALAZINE (APRESOLINE) 25 MG tablet Take 12.5 mg by mouth every morning.       No current facility-administered medications for this visit.    Allergies: No Known Allergies  Past Medical History, Surgical history, Social history, and Family History were reviewed and updated.    Physical Exam: Blood pressure 118/68, pulse 69, temperature 97.6  F (36.4 C), temperature source Oral, resp. rate 18, height 5\' 11"  (1.803 m), weight 205 lb 4.8 oz (93.123 kg), SpO2 100.00%. ECOG: 1 General appearance: alert awake not in any distress. Head: Atraumatic no abnormalities Neck: no adenopathy, no masses palpated. Lymph nodes: Cervical, supraclavicular, and axillary nodes normal. Heart:regular rate and rhythm, S1, S2.  Lung:chest clear, no wheezing, rales, normal symmetric air entry no dullness to  percussion. Abdomen: soft, non-tender, without masses or organomegaly no ascites or shifting dullness. EXT:no erythema, induration, or nodules Neurological examination: No deficits at this time noted. He was able to ambulate without any difficulties.   Lab Results: Lab Results  Component Value Date   WBC 1.5* 01/05/2014   HGB 8.3* 01/05/2014   HCT 25.0* 01/05/2014   MCV 93.9 01/05/2014   PLT 126* 01/05/2014     Chemistry      Component Value Date/Time   NA 141 12/29/2013 1311   NA 140 02/24/2012 1249   K 3.5 12/29/2013 1311   K 3.9 02/24/2012 1249   CL 106 12/27/2012 0801   CL 110 02/24/2012 1249   CO2 23 12/29/2013 1311   CO2 23 02/24/2012 1249   BUN 15.9 12/29/2013 1311   BUN 18 02/24/2012 1249   CREATININE 1.0 12/29/2013 1311   CREATININE 0.98 02/24/2012 1249      Component Value Date/Time   CALCIUM 9.7 12/29/2013 1311   CALCIUM 8.9 02/24/2012 1249   ALKPHOS 86 12/29/2013 1311   ALKPHOS 85 02/24/2012 1249   AST 24 12/29/2013 1311   AST 18 02/24/2012 1249   ALT 17 12/29/2013 1311   ALT 12 02/24/2012 1249   BILITOT 0.86 12/29/2013 1311   BILITOT 0.6 02/24/2012 1249      Impression and Plan:  73 year old with the following issue:  1.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 He developed pelvic recurrence and he is currently being treated with systemic chemotherapy with carboplatin and gemcitabine. CT scan on 12/13/2013  showed stable disease. I plan on continuing chemotherapy with a total of 6 cycles. His day 14 of this current cycle will be discontinued due to neutropenia. He will return on 01/19/2014 for the start of cycle 5 of chemotherapy.  2. Pelvic discomfort: This could be related to this infiltrative mass. Pain is resolved now with chemotherapy.  3. Anti-emetics: He has Compazine at home.  4. Weight loss. He is doing better from that standpoint and his weight is up at this time.  5. IV access: Port-A-Cath has been placed without any  complications.  6. Followup: He will followup on 01/19/2014 for the start of cycle 5.  OVFIEP,PIRJJ  6/26/20159:50 AM

## 2014-01-05 NOTE — Patient Instructions (Signed)

## 2014-01-19 ENCOUNTER — Telehealth: Payer: Self-pay | Admitting: Oncology

## 2014-01-19 ENCOUNTER — Ambulatory Visit (HOSPITAL_BASED_OUTPATIENT_CLINIC_OR_DEPARTMENT_OTHER): Payer: Medicare Other | Admitting: Physician Assistant

## 2014-01-19 ENCOUNTER — Ambulatory Visit: Payer: Medicare Other

## 2014-01-19 ENCOUNTER — Other Ambulatory Visit (HOSPITAL_BASED_OUTPATIENT_CLINIC_OR_DEPARTMENT_OTHER): Payer: Medicare Other

## 2014-01-19 ENCOUNTER — Ambulatory Visit (HOSPITAL_BASED_OUTPATIENT_CLINIC_OR_DEPARTMENT_OTHER): Payer: Medicare Other

## 2014-01-19 VITALS — BP 116/62 | HR 58 | Temp 97.4°F | Resp 18 | Ht 71.0 in | Wt 203.0 lb

## 2014-01-19 DIAGNOSIS — C679 Malignant neoplasm of bladder, unspecified: Secondary | ICD-10-CM

## 2014-01-19 DIAGNOSIS — C670 Malignant neoplasm of trigone of bladder: Secondary | ICD-10-CM

## 2014-01-19 DIAGNOSIS — Z5111 Encounter for antineoplastic chemotherapy: Secondary | ICD-10-CM

## 2014-01-19 DIAGNOSIS — Z95828 Presence of other vascular implants and grafts: Secondary | ICD-10-CM

## 2014-01-19 LAB — CBC WITH DIFFERENTIAL/PLATELET
BASO%: 0.4 % (ref 0.0–2.0)
BASOS ABS: 0 10*3/uL (ref 0.0–0.1)
EOS ABS: 0 10*3/uL (ref 0.0–0.5)
EOS%: 0.4 % (ref 0.0–7.0)
HCT: 25.5 % — ABNORMAL LOW (ref 38.4–49.9)
HGB: 8.6 g/dL — ABNORMAL LOW (ref 13.0–17.1)
LYMPH#: 0.9 10*3/uL (ref 0.9–3.3)
LYMPH%: 31 % (ref 14.0–49.0)
MCH: 32.7 pg (ref 27.2–33.4)
MCHC: 33.9 g/dL (ref 32.0–36.0)
MCV: 96.3 fL (ref 79.3–98.0)
MONO#: 0.6 10*3/uL (ref 0.1–0.9)
MONO%: 23.4 % — ABNORMAL HIGH (ref 0.0–14.0)
NEUT%: 44.8 % (ref 39.0–75.0)
NEUTROS ABS: 1.2 10*3/uL — AB (ref 1.5–6.5)
Platelets: 170 10*3/uL (ref 140–400)
RBC: 2.65 10*6/uL — AB (ref 4.20–5.82)
RDW: 20.9 % — AB (ref 11.0–14.6)
WBC: 2.8 10*3/uL — ABNORMAL LOW (ref 4.0–10.3)

## 2014-01-19 LAB — COMPREHENSIVE METABOLIC PANEL (CC13)
ALK PHOS: 96 U/L (ref 40–150)
ALT: 21 U/L (ref 0–55)
AST: 27 U/L (ref 5–34)
Albumin: 3.5 g/dL (ref 3.5–5.0)
Anion Gap: 8 mEq/L (ref 3–11)
BILIRUBIN TOTAL: 0.6 mg/dL (ref 0.20–1.20)
BUN: 19.1 mg/dL (ref 7.0–26.0)
CO2: 22 mEq/L (ref 22–29)
Calcium: 9.3 mg/dL (ref 8.4–10.4)
Chloride: 111 mEq/L — ABNORMAL HIGH (ref 98–109)
Creatinine: 1.1 mg/dL (ref 0.7–1.3)
Glucose: 91 mg/dl (ref 70–140)
POTASSIUM: 3.9 meq/L (ref 3.5–5.1)
SODIUM: 141 meq/L (ref 136–145)
Total Protein: 7.1 g/dL (ref 6.4–8.3)

## 2014-01-19 MED ORDER — PROCHLORPERAZINE MALEATE 10 MG PO TABS
10.0000 mg | ORAL_TABLET | Freq: Once | ORAL | Status: AC
  Administered 2014-01-19: 10 mg via ORAL

## 2014-01-19 MED ORDER — PROCHLORPERAZINE MALEATE 10 MG PO TABS
ORAL_TABLET | ORAL | Status: AC
  Filled 2014-01-19: qty 1

## 2014-01-19 MED ORDER — SODIUM CHLORIDE 0.9 % IJ SOLN
10.0000 mL | INTRAMUSCULAR | Status: DC | PRN
  Administered 2014-01-19: 10 mL via INTRAVENOUS
  Filled 2014-01-19: qty 10

## 2014-01-19 MED ORDER — SODIUM CHLORIDE 0.9 % IV SOLN
Freq: Once | INTRAVENOUS | Status: AC
  Administered 2014-01-19: 10:00:00 via INTRAVENOUS

## 2014-01-19 MED ORDER — SODIUM CHLORIDE 0.9 % IV SOLN
1000.0000 mg/m2 | Freq: Once | INTRAVENOUS | Status: AC
  Administered 2014-01-19: 2204 mg via INTRAVENOUS
  Filled 2014-01-19: qty 57.97

## 2014-01-19 MED ORDER — SODIUM CHLORIDE 0.9 % IJ SOLN
10.0000 mL | INTRAMUSCULAR | Status: DC | PRN
  Administered 2014-01-19: 10 mL
  Filled 2014-01-19: qty 10

## 2014-01-19 MED ORDER — HEPARIN SOD (PORK) LOCK FLUSH 100 UNIT/ML IV SOLN
500.0000 [IU] | Freq: Once | INTRAVENOUS | Status: AC | PRN
  Administered 2014-01-19: 500 [IU]
  Filled 2014-01-19: qty 5

## 2014-01-19 NOTE — Progress Notes (Signed)
Ok to treat today per Awilda Metro, [facility]

## 2014-01-19 NOTE — Patient Instructions (Signed)

## 2014-01-19 NOTE — Telephone Encounter (Signed)
gv pt appt schedule for july/aug °

## 2014-01-21 ENCOUNTER — Encounter: Payer: Self-pay | Admitting: Physician Assistant

## 2014-01-21 NOTE — Progress Notes (Signed)
Hematology and Oncology Follow Up Visit  Joe Park [id number] 23-Aug-1940 73 y.o. 01/21/2014 4:35 PM   Principle Diagnosis: 73 year old man diagnosed with bladder cancer in 09/2011. Now he has recurrent advanced disease.  Prior Therapy: He is S/P cystoprostatectomy and bilateral pelvic lymph node dissection and creation of an ileal conduit done on 11/11/2011. 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 The pathological stage was T3b N0.  Secondary Diagnosis: Prostate cancer diagnosed in 2002. Not active at this time.   Current therapy: Systemic chemotherapy with carboplatin for an AUC of 5 given on day 1 and gemcitabine at 1000 mg per meter square given on days 1 and 8 every 3 weeks. Here for cycle 4 day 8.  Interim History:  Joe Park returns today for a follow up visit prior to his next treatment of systemic chemotherapy. He continues to tolerate chemotherapy well. He reports improvement in his appetite. His pelvic pain has resolved since starting chemotherapy. He is able to ambulate without any difficulty. His quality of life has improved and remains stable. He denies any fever chills, no nausea vomiting or night sweats. Denies cough or hemoptysis or hematemesis. Denies abdominal pain no hematochezia or melena. Has not reported any genitourinary complaints. He denied headaches or blurry vision or double vision. Did not report any syncope or seizures. Did not report any chest pain shortness of breath or cough or hemoptysis. Did not report any hematochezia or melena. Does not report any urinary symptoms. Does not report any musculoskeletal complaints. Did not report any arthralgias myalgias or rash or pruritus. Rest or view of system is unremarkable.   Medications: I have reviewed the patient's current medications. Current Outpatient Prescriptions  Medication Sig Dispense  Refill  . aspirin 81 MG chewable tablet Chew 81 mg by mouth daily with breakfast.       . Chlorhexidine Gluconate (PERIOGARD MT) Use as directed 0.5 oz in the mouth or throat at bedtime.       Marland Kitchen levothyroxine (SYNTHROID, LEVOTHROID) 125 MCG tablet Take 125 mcg by mouth daily with breakfast.       . lidocaine-prilocaine (EMLA) cream Apply 1 application topically as needed. Apply approx 1/2 tsp to skin, over port-a-cath prior to chemotherapy treatments.  30 g  2  . lisinopril (PRINIVIL,ZESTRIL) 40 MG tablet Take 40 mg by mouth every morning.      . Multiple Vitamin (MULITIVITAMIN WITH MINERALS) TABS Take 1 tablet by mouth every morning.      . prochlorperazine (COMPAZINE) 10 MG tablet Take 1 tablet (10 mg total) by mouth every 6 (six) hours as needed for nausea or vomiting.  60 tablet  1  . simvastatin (ZOCOR) 40 MG tablet Take 40 mg by mouth at bedtime.      . traMADol (ULTRAM) 50 MG tablet Take 1 tablet by mouth daily as needed for moderate pain.       . [DISCONTINUED] hydrALAZINE (APRESOLINE) 25 MG tablet Take 12.5 mg by mouth every morning.       No current facility-administered medications for this visit.    Allergies: No Known Allergies  Past Medical History, Surgical history, Social history, and Family History were reviewed and updated.    Physical Exam: Blood pressure 116/62, pulse 58, temperature 97.4 F (36.3 C), temperature source Oral, resp. rate 18, height 5\' 11"  (1.803 m), weight 203 lb (92.08 kg), SpO2 100.00%. ECOG: 1 General appearance:  alert awake not in any distress. Head: Atraumatic no abnormalities Neck: no adenopathy, no masses palpated. Lymph nodes: Cervical, supraclavicular, and axillary nodes normal. Heart:regular rate and rhythm, S1, S2.  Lung:chest clear, no wheezing, rales, normal symmetric air entry no dullness to percussion. Abdomen: soft, non-tender, without masses or organomegaly no ascites or shifting dullness. EXT:no erythema, induration, or  nodules Neurological examination: No deficits at this time noted. He was able to ambulate without any difficulties.   Lab Results: Lab Results  Component Value Date   WBC 2.8* 01/19/2014   HGB 8.6* 01/19/2014   HCT 25.5* 01/19/2014   MCV 96.3 01/19/2014   PLT 170 01/19/2014     Chemistry      Component Value Date/Time   NA 141 01/19/2014 0911   NA 140 02/24/2012 1249   K 3.9 01/19/2014 0911   K 3.9 02/24/2012 1249   CL 106 12/27/2012 0801   CL 110 02/24/2012 1249   CO2 22 01/19/2014 0911   CO2 23 02/24/2012 1249   BUN 19.1 01/19/2014 0911   BUN 18 02/24/2012 1249   CREATININE 1.1 01/19/2014 0911   CREATININE 0.98 02/24/2012 1249      Component Value Date/Time   CALCIUM 9.3 01/19/2014 0911   CALCIUM 8.9 02/24/2012 1249   ALKPHOS 96 01/19/2014 0911   ALKPHOS 85 02/24/2012 1249   AST 27 01/19/2014 0911   AST 18 02/24/2012 1249   ALT 21 01/19/2014 0911   ALT 12 02/24/2012 1249   BILITOT 0.60 01/19/2014 0911   BILITOT 0.6 02/24/2012 1249      Impression and Plan:  73 year old with the following issue:  1.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 He developed pelvic recurrence and he is currently being treated with systemic chemotherapy with carboplatin and gemcitabine. CT scan on 12/13/2013  showed stable disease. I plan on continuing chemotherapy with a total of 6 cycles. He will proceed with chemotherapy today as scheduled.  2. Pelvic discomfort: This could be related to this infiltrative mass. Pain is resolved now with chemotherapy.  3. Anti-emetics: He has Compazine at home.  4. Weight loss. He is doing better from that standpoint and his weight is relatively stable  5. IV access: Port-A-Cath has been placed without any complications.  6. Followup: He will followup on 02/09/2014 for the start of cycle 5.  Joe Park  7/12/20154:35 PM

## 2014-01-21 NOTE — Patient Instructions (Signed)
Continue labs and chemotherapy as scheduled  Follow up in 2 weeks 

## 2014-01-26 ENCOUNTER — Other Ambulatory Visit: Payer: Medicare Other

## 2014-01-26 ENCOUNTER — Ambulatory Visit: Payer: Medicare Other

## 2014-02-09 ENCOUNTER — Ambulatory Visit (HOSPITAL_BASED_OUTPATIENT_CLINIC_OR_DEPARTMENT_OTHER): Payer: Medicare Other

## 2014-02-09 ENCOUNTER — Telehealth: Payer: Self-pay | Admitting: Oncology

## 2014-02-09 ENCOUNTER — Ambulatory Visit: Payer: Medicare Other

## 2014-02-09 ENCOUNTER — Encounter: Payer: Self-pay | Admitting: Oncology

## 2014-02-09 ENCOUNTER — Telehealth: Payer: Self-pay | Admitting: *Deleted

## 2014-02-09 ENCOUNTER — Ambulatory Visit (HOSPITAL_BASED_OUTPATIENT_CLINIC_OR_DEPARTMENT_OTHER): Payer: Medicare Other | Admitting: Oncology

## 2014-02-09 ENCOUNTER — Other Ambulatory Visit (HOSPITAL_BASED_OUTPATIENT_CLINIC_OR_DEPARTMENT_OTHER): Payer: Medicare Other

## 2014-02-09 VITALS — BP 132/66 | HR 57 | Temp 97.6°F | Resp 18 | Ht 71.0 in | Wt 200.8 lb

## 2014-02-09 DIAGNOSIS — C679 Malignant neoplasm of bladder, unspecified: Secondary | ICD-10-CM

## 2014-02-09 DIAGNOSIS — R634 Abnormal weight loss: Secondary | ICD-10-CM

## 2014-02-09 DIAGNOSIS — Z5111 Encounter for antineoplastic chemotherapy: Secondary | ICD-10-CM

## 2014-02-09 DIAGNOSIS — Z8546 Personal history of malignant neoplasm of prostate: Secondary | ICD-10-CM

## 2014-02-09 DIAGNOSIS — C670 Malignant neoplasm of trigone of bladder: Secondary | ICD-10-CM

## 2014-02-09 DIAGNOSIS — C495 Malignant neoplasm of connective and soft tissue of pelvis: Secondary | ICD-10-CM

## 2014-02-09 DIAGNOSIS — Z95828 Presence of other vascular implants and grafts: Secondary | ICD-10-CM

## 2014-02-09 LAB — CBC WITH DIFFERENTIAL/PLATELET
BASO%: 0.6 % (ref 0.0–2.0)
Basophils Absolute: 0 10*3/uL (ref 0.0–0.1)
EOS%: 1 % (ref 0.0–7.0)
Eosinophils Absolute: 0 10*3/uL (ref 0.0–0.5)
HCT: 29.4 % — ABNORMAL LOW (ref 38.4–49.9)
HGB: 9.7 g/dL — ABNORMAL LOW (ref 13.0–17.1)
LYMPH%: 22.2 % (ref 14.0–49.0)
MCH: 31.9 pg (ref 27.2–33.4)
MCHC: 33.2 g/dL (ref 32.0–36.0)
MCV: 96.1 fL (ref 79.3–98.0)
MONO#: 0.7 10*3/uL (ref 0.1–0.9)
MONO%: 16.9 % — AB (ref 0.0–14.0)
NEUT#: 2.4 10*3/uL (ref 1.5–6.5)
NEUT%: 59.3 % (ref 39.0–75.0)
Platelets: 200 10*3/uL (ref 140–400)
RBC: 3.06 10*6/uL — AB (ref 4.20–5.82)
RDW: 17.5 % — AB (ref 11.0–14.6)
WBC: 4 10*3/uL (ref 4.0–10.3)
lymph#: 0.9 10*3/uL (ref 0.9–3.3)

## 2014-02-09 LAB — COMPREHENSIVE METABOLIC PANEL (CC13)
ALT: 16 U/L (ref 0–55)
AST: 26 U/L (ref 5–34)
Albumin: 3.5 g/dL (ref 3.5–5.0)
Alkaline Phosphatase: 83 U/L (ref 40–150)
Anion Gap: 8 mEq/L (ref 3–11)
BUN: 13.9 mg/dL (ref 7.0–26.0)
CO2: 24 mEq/L (ref 22–29)
Calcium: 9.5 mg/dL (ref 8.4–10.4)
Chloride: 109 mEq/L (ref 98–109)
Creatinine: 0.9 mg/dL (ref 0.7–1.3)
Glucose: 91 mg/dl (ref 70–140)
POTASSIUM: 3.9 meq/L (ref 3.5–5.1)
SODIUM: 141 meq/L (ref 136–145)
TOTAL PROTEIN: 7.2 g/dL (ref 6.4–8.3)
Total Bilirubin: 0.71 mg/dL (ref 0.20–1.20)

## 2014-02-09 MED ORDER — SODIUM CHLORIDE 0.9 % IJ SOLN
10.0000 mL | INTRAMUSCULAR | Status: DC | PRN
  Filled 2014-02-09: qty 10

## 2014-02-09 MED ORDER — SODIUM CHLORIDE 0.9 % IV SOLN
Freq: Once | INTRAVENOUS | Status: AC
  Administered 2014-02-09: 11:00:00 via INTRAVENOUS

## 2014-02-09 MED ORDER — DEXAMETHASONE SODIUM PHOSPHATE 20 MG/5ML IJ SOLN
20.0000 mg | Freq: Once | INTRAMUSCULAR | Status: AC
  Administered 2014-02-09: 20 mg via INTRAVENOUS

## 2014-02-09 MED ORDER — ONDANSETRON 16 MG/50ML IVPB (CHCC)
INTRAVENOUS | Status: AC
  Filled 2014-02-09: qty 16

## 2014-02-09 MED ORDER — ONDANSETRON 16 MG/50ML IVPB (CHCC)
16.0000 mg | Freq: Once | INTRAVENOUS | Status: AC
  Administered 2014-02-09: 16 mg via INTRAVENOUS

## 2014-02-09 MED ORDER — SODIUM CHLORIDE 0.9 % IV SOLN
1000.0000 mg/m2 | Freq: Once | INTRAVENOUS | Status: AC
  Administered 2014-02-09: 2204 mg via INTRAVENOUS
  Filled 2014-02-09: qty 57.97

## 2014-02-09 MED ORDER — SODIUM CHLORIDE 0.9 % IJ SOLN
10.0000 mL | INTRAMUSCULAR | Status: DC | PRN
  Administered 2014-02-09: 10 mL via INTRAVENOUS
  Filled 2014-02-09: qty 10

## 2014-02-09 MED ORDER — DEXAMETHASONE SODIUM PHOSPHATE 20 MG/5ML IJ SOLN
INTRAMUSCULAR | Status: AC
  Filled 2014-02-09: qty 5

## 2014-02-09 MED ORDER — HEPARIN SOD (PORK) LOCK FLUSH 100 UNIT/ML IV SOLN
500.0000 [IU] | Freq: Once | INTRAVENOUS | Status: DC | PRN
  Filled 2014-02-09: qty 5

## 2014-02-09 MED ORDER — SODIUM CHLORIDE 0.9 % IV SOLN
582.0000 mg | Freq: Once | INTRAVENOUS | Status: AC
  Administered 2014-02-09: 580 mg via INTRAVENOUS
  Filled 2014-02-09: qty 58

## 2014-02-09 NOTE — Patient Instructions (Signed)
[facility] Discharge Instructions for Patients Receiving Chemotherapy  Today you received the following chemotherapy agents: Carboplatin and Gemzar.  To help prevent nausea and vomiting after your treatment, we encourage you to take your nausea medication: Compazine 28m g every 6 hours as needed.   If you develop nausea and vomiting that is not controlled by your nausea medication, call the clinic.   BELOW ARE SYMPTOMS THAT SHOULD BE REPORTED IMMEDIATELY:  *FEVER GREATER THAN 100.5 F  *CHILLS WITH OR WITHOUT FEVER  NAUSEA AND VOMITING THAT IS NOT CONTROLLED WITH YOUR NAUSEA MEDICATION  *UNUSUAL SHORTNESS OF BREATH  *UNUSUAL BRUISING OR BLEEDING  TENDERNESS IN MOUTH AND THROAT WITH OR WITHOUT PRESENCE OF ULCERS  *URINARY PROBLEMS  *BOWEL PROBLEMS  UNUSUAL RASH Items with * indicate a potential emergency and should be followed up as soon as possible.  Feel free to call the clinic you have any questions or concerns. The clinic phone number is [phone].

## 2014-02-09 NOTE — Progress Notes (Signed)
Hematology and Oncology Follow Up Visit  Joe Park [id number] 18-Nov-1940 73 y.o. 02/09/2014 10:20 AM   Principle Diagnosis: 73 year old man diagnosed with bladder cancer in 09/2011. Now he has recurrent advanced disease.  Prior Therapy: He is S/P cystoprostatectomy and bilateral pelvic lymph node dissection and creation of an ileal conduit done on 11/11/2011. 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 The pathological stage was T3b N0.  Secondary Diagnosis: Prostate cancer diagnosed in 2002. Not active at this time.   Current therapy: Systemic chemotherapy with carboplatin for an AUC of 5 given on day 1 and gemcitabine at 1000 mg per meter square given on days 1 and 8 every 3 weeks. Here for cycle 5 day 1.   Interim History:  Joe Park returns today for a follow up visit prior to his next treatment of systemic chemotherapy. He continues to tolerate chemotherapy well. He reports no complications at this time. His pelvic pain has resolved since starting chemotherapy. He is able to ambulate without any difficulty. He continues to have reasonable quality of life. He dnies any fever chills, no nausea vomiting or night sweats. Denies cough or hemoptysis or hematemesis. Denies abdominal pain no hematochezia or melena. Has not reported any genitourinary complaints. He declined headaches or blurry vision or double vision. Did not report any syncope or seizures. Did not report any chest pain shortness of breath or cough or hemoptysis. Did not report any hematochezia or melena. Does not report any urinary symptoms. Does not report any musculoskeletal complaints. Did not report any arthralgias myalgias or rash or pruritus. Rest or view of system is unremarkable.   Medications: I have reviewed the patient's current medications. Current Outpatient Prescriptions  Medication Sig Dispense  Refill  . aspirin 81 MG chewable tablet Chew 81 mg by mouth daily with breakfast.       . Chlorhexidine Gluconate (PERIOGARD MT) Use as directed 0.5 oz in the mouth or throat at bedtime.       Marland Kitchen levothyroxine (SYNTHROID, LEVOTHROID) 125 MCG tablet Take 125 mcg by mouth daily with breakfast.       . lidocaine-prilocaine (EMLA) cream Apply 1 application topically as needed. Apply approx 1/2 tsp to skin, over port-a-cath prior to chemotherapy treatments.  30 g  2  . lisinopril (PRINIVIL,ZESTRIL) 40 MG tablet Take 40 mg by mouth every morning.      . Multiple Vitamin (MULITIVITAMIN WITH MINERALS) TABS Take 1 tablet by mouth every morning.      . prochlorperazine (COMPAZINE) 10 MG tablet Take 1 tablet (10 mg total) by mouth every 6 (six) hours as needed for nausea or vomiting.  60 tablet  1  . simvastatin (ZOCOR) 40 MG tablet Take 40 mg by mouth at bedtime.      . traMADol (ULTRAM) 50 MG tablet Take 1 tablet by mouth daily as needed for moderate pain.       . [DISCONTINUED] hydrALAZINE (APRESOLINE) 25 MG tablet Take 12.5 mg by mouth every morning.       No current facility-administered medications for this visit.    Allergies: No Known Allergies  Past Medical History, Surgical history, Social history, and Family History were reviewed and updated.    Physical Exam: Blood pressure 132/66, pulse 57, temperature 97.6 F (36.4 C), temperature source Oral, resp. rate 18, height 5\' 11"  (1.803 m), weight 200 lb 12.8 oz (91.082 kg). ECOG: 1 General  appearance: alert awake not in any distress. Head: Atraumatic no abnormalities Neck: no adenopathy, no masses palpated. Lymph nodes: Cervical, supraclavicular, and axillary nodes normal. Heart:regular rate and rhythm, S1, S2.  Lung:chest clear, no wheezing, rales, normal symmetric air entry no dullness to percussion. Abdomen: soft, non-tender, without masses or organomegaly  EXT:no erythema, induration, or nodules Neurological examination: No deficits  at this time noted.   Lab Results: Lab Results  Component Value Date   WBC 4.0 02/09/2014   HGB 9.7* 02/09/2014   HCT 29.4* 02/09/2014   MCV 96.1 02/09/2014   PLT 200 02/09/2014     Chemistry      Component Value Date/Time   NA 141 02/09/2014 0928   NA 140 02/24/2012 1249   K 3.9 02/09/2014 0928   K 3.9 02/24/2012 1249   CL 106 12/27/2012 0801   CL 110 02/24/2012 1249   CO2 24 02/09/2014 0928   CO2 23 02/24/2012 1249   BUN 13.9 02/09/2014 0928   BUN 18 02/24/2012 1249   CREATININE 0.9 02/09/2014 0928   CREATININE 0.98 02/24/2012 1249      Component Value Date/Time   CALCIUM 9.5 02/09/2014 0928   CALCIUM 8.9 02/24/2012 1249   ALKPHOS 83 02/09/2014 0928   ALKPHOS 85 02/24/2012 1249   AST 26 02/09/2014 0928   AST 18 02/24/2012 1249   ALT 16 02/09/2014 0928   ALT 12 02/24/2012 1249   BILITOT 0.71 02/09/2014 0928   BILITOT 0.6 02/24/2012 1249      Impression and Plan:  73 year old with the following issue:  1.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 He developed pelvic recurrence and he is currently being treated with systemic chemotherapy with carboplatin and gemcitabine. CT scan on 12/13/2013  showed stable disease. I plan on continuing chemotherapy with a total of 6 cycles. He will proceed with day 1 cycle 5 today appear  2. Pelvic discomfort: This could be related to this infiltrative mass. Pain is resolved now with chemotherapy.  3. Anti-emetics: He has Compazine at home.  4. Weight loss. He is doing better from that standpoint and his weight is up at this time.  5. IV access: Port-A-Cath has been placed without any complications.  6. Followup: He will followup on 02/16/2014 for day 8 of cycle 5 at his counts are adequate. I will see him prior to cycle 6 on 03/02/2014.  RFVOHK,GOVPC  7/31/201510:20 AM

## 2014-02-09 NOTE — Telephone Encounter (Signed)
Per [facility] staff message scheduled appts. Advised scheduler 

## 2014-02-09 NOTE — Telephone Encounter (Signed)
Pt confirmed labs/ov per 07/31 [facility], gave pt AVS...KJ °

## 2014-02-09 NOTE — Patient Instructions (Signed)

## 2014-02-16 ENCOUNTER — Other Ambulatory Visit (HOSPITAL_BASED_OUTPATIENT_CLINIC_OR_DEPARTMENT_OTHER): Payer: Medicare Other

## 2014-02-16 ENCOUNTER — Ambulatory Visit: Payer: Medicare Other

## 2014-02-16 ENCOUNTER — Ambulatory Visit (HOSPITAL_BASED_OUTPATIENT_CLINIC_OR_DEPARTMENT_OTHER): Payer: Medicare Other

## 2014-02-16 VITALS — BP 129/68 | HR 95 | Temp 98.5°F | Resp 20

## 2014-02-16 DIAGNOSIS — Z5111 Encounter for antineoplastic chemotherapy: Secondary | ICD-10-CM

## 2014-02-16 DIAGNOSIS — Z95828 Presence of other vascular implants and grafts: Secondary | ICD-10-CM

## 2014-02-16 DIAGNOSIS — C679 Malignant neoplasm of bladder, unspecified: Secondary | ICD-10-CM

## 2014-02-16 DIAGNOSIS — C670 Malignant neoplasm of trigone of bladder: Secondary | ICD-10-CM

## 2014-02-16 LAB — CBC WITH DIFFERENTIAL/PLATELET
BASO%: 0.1 % (ref 0.0–2.0)
Basophils Absolute: 0 10*3/uL (ref 0.0–0.1)
EOS%: 0.1 % (ref 0.0–7.0)
Eosinophils Absolute: 0 10*3/uL (ref 0.0–0.5)
HCT: 27.4 % — ABNORMAL LOW (ref 38.4–49.9)
HGB: 9 g/dL — ABNORMAL LOW (ref 13.0–17.1)
LYMPH%: 10.1 % — AB (ref 14.0–49.0)
MCH: 31.6 pg (ref 27.2–33.4)
MCHC: 33 g/dL (ref 32.0–36.0)
MCV: 95.8 fL (ref 79.3–98.0)
MONO#: 0.2 10*3/uL (ref 0.1–0.9)
MONO%: 3.5 % (ref 0.0–14.0)
NEUT#: 4 10*3/uL (ref 1.5–6.5)
NEUT%: 86.2 % — ABNORMAL HIGH (ref 39.0–75.0)
PLATELETS: 85 10*3/uL — AB (ref 140–400)
RBC: 2.86 10*6/uL — AB (ref 4.20–5.82)
RDW: 16.8 % — ABNORMAL HIGH (ref 11.0–14.6)
WBC: 4.6 10*3/uL (ref 4.0–10.3)
lymph#: 0.5 10*3/uL — ABNORMAL LOW (ref 0.9–3.3)

## 2014-02-16 LAB — COMPREHENSIVE METABOLIC PANEL (CC13)
ALK PHOS: 78 U/L (ref 40–150)
ALT: 18 U/L (ref 0–55)
AST: 23 U/L (ref 5–34)
Albumin: 3.2 g/dL — ABNORMAL LOW (ref 3.5–5.0)
Anion Gap: 9 mEq/L (ref 3–11)
BILIRUBIN TOTAL: 1.28 mg/dL — AB (ref 0.20–1.20)
BUN: 23.8 mg/dL (ref 7.0–26.0)
CO2: 21 mEq/L — ABNORMAL LOW (ref 22–29)
Calcium: 9.5 mg/dL (ref 8.4–10.4)
Chloride: 104 mEq/L (ref 98–109)
Creatinine: 1.4 mg/dL — ABNORMAL HIGH (ref 0.7–1.3)
GLUCOSE: 115 mg/dL (ref 70–140)
Potassium: 3.8 mEq/L (ref 3.5–5.1)
Sodium: 134 mEq/L — ABNORMAL LOW (ref 136–145)
TOTAL PROTEIN: 7.3 g/dL (ref 6.4–8.3)

## 2014-02-16 MED ORDER — HEPARIN SOD (PORK) LOCK FLUSH 100 UNIT/ML IV SOLN
500.0000 [IU] | Freq: Once | INTRAVENOUS | Status: AC | PRN
  Administered 2014-02-16: 500 [IU]
  Filled 2014-02-16: qty 5

## 2014-02-16 MED ORDER — PROCHLORPERAZINE MALEATE 10 MG PO TABS
ORAL_TABLET | ORAL | Status: AC
  Filled 2014-02-16: qty 1

## 2014-02-16 MED ORDER — SODIUM CHLORIDE 0.9 % IV SOLN
Freq: Once | INTRAVENOUS | Status: AC
  Administered 2014-02-16: 11:00:00 via INTRAVENOUS

## 2014-02-16 MED ORDER — SODIUM CHLORIDE 0.9 % IJ SOLN
10.0000 mL | INTRAMUSCULAR | Status: DC | PRN
  Administered 2014-02-16: 10 mL
  Filled 2014-02-16: qty 10

## 2014-02-16 MED ORDER — PROCHLORPERAZINE MALEATE 10 MG PO TABS
10.0000 mg | ORAL_TABLET | Freq: Once | ORAL | Status: AC
  Administered 2014-02-16: 10 mg via ORAL

## 2014-02-16 MED ORDER — SODIUM CHLORIDE 0.9 % IJ SOLN
10.0000 mL | INTRAMUSCULAR | Status: DC | PRN
  Administered 2014-02-16: 10 mL via INTRAVENOUS
  Filled 2014-02-16: qty 10

## 2014-02-16 MED ORDER — SODIUM CHLORIDE 0.9 % IV SOLN
1000.0000 mg/m2 | Freq: Once | INTRAVENOUS | Status: AC
  Administered 2014-02-16: 2204 mg via INTRAVENOUS
  Filled 2014-02-16: qty 57.97

## 2014-02-16 NOTE — Patient Instructions (Signed)
[facility] Discharge Instructions for Patients Receiving Chemotherapy  Today you received the following chemotherapy agents Gemzar    To help prevent nausea and vomiting after your treatment, we encourage you to take your nausea medication Compazine 10 mg every 6 hours   If you develop nausea and vomiting that is not controlled by your nausea medication, call the clinic.   BELOW ARE SYMPTOMS THAT SHOULD BE REPORTED IMMEDIATELY:  *FEVER GREATER THAN 100.5 F  *CHILLS WITH OR WITHOUT FEVER  NAUSEA AND VOMITING THAT IS NOT CONTROLLED WITH YOUR NAUSEA MEDICATION  *UNUSUAL SHORTNESS OF BREATH  *UNUSUAL BRUISING OR BLEEDING  TENDERNESS IN MOUTH AND THROAT WITH OR WITHOUT PRESENCE OF ULCERS  *URINARY PROBLEMS  *BOWEL PROBLEMS  UNUSUAL RASH Items with * indicate a potential emergency and should be followed up as soon as possible.  Feel free to call the clinic you have any questions or concerns. The clinic phone number is [phone].

## 2014-02-16 NOTE — Progress Notes (Signed)
CBC, CMET reviewed by Dr. Alen Blew; OK to treat despite labs.

## 2014-03-02 ENCOUNTER — Telehealth: Payer: Self-pay | Admitting: Oncology

## 2014-03-02 ENCOUNTER — Other Ambulatory Visit: Payer: Self-pay | Admitting: Medical Oncology

## 2014-03-02 ENCOUNTER — Other Ambulatory Visit (HOSPITAL_BASED_OUTPATIENT_CLINIC_OR_DEPARTMENT_OTHER): Payer: Medicare Other

## 2014-03-02 ENCOUNTER — Ambulatory Visit (HOSPITAL_BASED_OUTPATIENT_CLINIC_OR_DEPARTMENT_OTHER): Payer: Medicare Other

## 2014-03-02 ENCOUNTER — Ambulatory Visit (HOSPITAL_COMMUNITY)
Admission: RE | Admit: 2014-03-02 | Discharge: 2014-03-02 | Disposition: A | Discharge status: Home / Self Care | Payer: Medicare Other | Source: Ambulatory Visit | Attending: Oncology | Admitting: Oncology

## 2014-03-02 ENCOUNTER — Other Ambulatory Visit: Payer: Medicare Other

## 2014-03-02 ENCOUNTER — Other Ambulatory Visit: Payer: Self-pay | Admitting: *Deleted

## 2014-03-02 ENCOUNTER — Ambulatory Visit: Payer: Medicare Other

## 2014-03-02 ENCOUNTER — Ambulatory Visit (HOSPITAL_BASED_OUTPATIENT_CLINIC_OR_DEPARTMENT_OTHER): Payer: Medicare Other | Admitting: Oncology

## 2014-03-02 VITALS — BP 114/54 | HR 70 | Temp 98.8°F | Resp 18 | Ht 71.0 in | Wt 193.7 lb

## 2014-03-02 VITALS — BP 124/71 | HR 55 | Temp 97.3°F | Resp 18

## 2014-03-02 DIAGNOSIS — C679 Malignant neoplasm of bladder, unspecified: Secondary | ICD-10-CM | POA: Diagnosis not present

## 2014-03-02 DIAGNOSIS — D649 Anemia, unspecified: Secondary | ICD-10-CM

## 2014-03-02 DIAGNOSIS — Z95828 Presence of other vascular implants and grafts: Secondary | ICD-10-CM

## 2014-03-02 DIAGNOSIS — C50919 Malignant neoplasm of unspecified site of unspecified female breast: Secondary | ICD-10-CM

## 2014-03-02 DIAGNOSIS — C779 Secondary and unspecified malignant neoplasm of lymph node, unspecified: Secondary | ICD-10-CM

## 2014-03-02 LAB — CBC WITH DIFFERENTIAL/PLATELET
BASO%: 0 % (ref 0.0–2.0)
BASOS ABS: 0 10*3/uL (ref 0.0–0.1)
EOS ABS: 0 10*3/uL (ref 0.0–0.5)
EOS%: 0 % (ref 0.0–7.0)
HCT: 21.6 % — ABNORMAL LOW (ref 38.4–49.9)
HGB: 7.2 g/dL — ABNORMAL LOW (ref 13.0–17.1)
LYMPH%: 31 % (ref 14.0–49.0)
MCH: 30.6 pg (ref 27.2–33.4)
MCHC: 33.3 g/dL (ref 32.0–36.0)
MCV: 91.9 fL (ref 79.3–98.0)
MONO#: 0.8 10*3/uL (ref 0.1–0.9)
MONO%: 25.2 % — ABNORMAL HIGH (ref 0.0–14.0)
NEUT#: 1.4 10*3/uL — ABNORMAL LOW (ref 1.5–6.5)
NEUT%: 43.8 % (ref 39.0–75.0)
NRBC: 0 % (ref 0–0)
PLATELETS: 100 10*3/uL — AB (ref 140–400)
RBC: 2.35 10*6/uL — AB (ref 4.20–5.82)
RDW: 14.8 % — AB (ref 11.0–14.6)
WBC: 3.3 10*3/uL — ABNORMAL LOW (ref 4.0–10.3)
lymph#: 1 10*3/uL (ref 0.9–3.3)

## 2014-03-02 LAB — COMPREHENSIVE METABOLIC PANEL (CC13)
ALT: 169 U/L — ABNORMAL HIGH (ref 0–55)
ANION GAP: 9 meq/L (ref 3–11)
AST: 95 U/L — ABNORMAL HIGH (ref 5–34)
Albumin: 2.5 g/dL — ABNORMAL LOW (ref 3.5–5.0)
Alkaline Phosphatase: 147 U/L (ref 40–150)
BUN: 22.9 mg/dL (ref 7.0–26.0)
CO2: 21 meq/L — AB (ref 22–29)
CREATININE: 1.2 mg/dL (ref 0.7–1.3)
Calcium: 9.3 mg/dL (ref 8.4–10.4)
Chloride: 105 mEq/L (ref 98–109)
GLUCOSE: 102 mg/dL (ref 70–140)
Potassium: 4.2 mEq/L (ref 3.5–5.1)
SODIUM: 136 meq/L (ref 136–145)
TOTAL PROTEIN: 7.4 g/dL (ref 6.4–8.3)
Total Bilirubin: 0.36 mg/dL (ref 0.20–1.20)

## 2014-03-02 LAB — PREPARE RBC (CROSSMATCH)

## 2014-03-02 LAB — HOLD TUBE, BLOOD BANK

## 2014-03-02 MED ORDER — ACETAMINOPHEN 325 MG PO TABS
650.0000 mg | ORAL_TABLET | Freq: Once | ORAL | Status: AC
  Administered 2014-03-02: 650 mg via ORAL

## 2014-03-02 MED ORDER — HEPARIN SOD (PORK) LOCK FLUSH 100 UNIT/ML IV SOLN
500.0000 [IU] | Freq: Every day | INTRAVENOUS | Status: AC | PRN
  Administered 2014-03-02: 500 [IU]
  Filled 2014-03-02: qty 5

## 2014-03-02 MED ORDER — SODIUM CHLORIDE 0.9 % IJ SOLN
10.0000 mL | INTRAMUSCULAR | Status: AC | PRN
  Administered 2014-03-02: 10 mL
  Filled 2014-03-02: qty 10

## 2014-03-02 MED ORDER — SODIUM CHLORIDE 0.9 % IV SOLN
250.0000 mL | Freq: Once | INTRAVENOUS | Status: AC
  Administered 2014-03-02: 250 mL via INTRAVENOUS

## 2014-03-02 MED ORDER — DIPHENHYDRAMINE HCL 25 MG PO CAPS
25.0000 mg | ORAL_CAPSULE | Freq: Once | ORAL | Status: AC
  Administered 2014-03-02: 25 mg via ORAL

## 2014-03-02 MED ORDER — TRAMADOL HCL 50 MG PO TABS: 50.0000 mg | ORAL_TABLET | Freq: Every day | ORAL | Status: DC | PRN

## 2014-03-02 MED ORDER — ACETAMINOPHEN 325 MG PO TABS
ORAL_TABLET | ORAL | Status: AC
  Filled 2014-03-02: qty 2

## 2014-03-02 MED ORDER — SODIUM CHLORIDE 0.9 % IJ SOLN
10.0000 mL | INTRAMUSCULAR | Status: DC | PRN
  Administered 2014-03-02: 10 mL via INTRAVENOUS
  Filled 2014-03-02: qty 10

## 2014-03-02 MED ORDER — DIPHENHYDRAMINE HCL 25 MG PO CAPS
ORAL_CAPSULE | ORAL | Status: AC
  Filled 2014-03-02: qty 1

## 2014-03-02 NOTE — Telephone Encounter (Signed)
Pt confirmed labs/ov per 08/21 [facility], gave pt AVS....KJ

## 2014-03-02 NOTE — Patient Instructions (Signed)

## 2014-03-02 NOTE — Progress Notes (Signed)
Hematology and Oncology Follow Up Visit  Joe Park [id number] 07-11-1941 73 y.o. 03/02/2014 9:33 AM   Principle Diagnosis: 73 year old man diagnosed with bladder cancer in 09/2011. Now he has recurrent advanced disease.  Prior Therapy: He is S/P cystoprostatectomy and bilateral pelvic lymph node dissection and creation of an ileal conduit done on 11/11/2011. 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 The pathological stage was T3b N0.  Secondary Diagnosis: Prostate cancer diagnosed in 2002. Not active at this time.   Current therapy: Systemic chemotherapy with carboplatin for an AUC of 5 given on day 1 and gemcitabine at 1000 mg per meter square given on days 1 and 8 every 3 weeks. Here for cycle 6 day 1.   Interim History:  [name] returns today for a follow up visit. He continues to tolerate chemotherapy well. He reports no complications at this time. He is more fatigued and tired today but not reporting any shortness of breath. His pelvic pain has resolved since starting chemotherapy. He is able to ambulate without any difficulty. He continues to have reasonable quality of life and continues to live independently. He dnies any fever chills, no nausea vomiting or night sweats. Denies cough or hemoptysis or hematemesis. Denies abdominal pain no hematochezia or melena. Has not reported any genitourinary complaints. He declined headaches or blurry vision or double vision. Did not report any syncope or seizures. Did not report any chest pain shortness of breath or cough or hemoptysis. Did not report any hematochezia or melena. Does not report any urinary symptoms. Does not report any musculoskeletal complaints. Did not report any arthralgias myalgias or rash or pruritus. Rest or view of system is unremarkable.   Medications: I have reviewed the patient's current  medications. Current Outpatient Prescriptions  Medication Sig Dispense Refill  . aspirin 81 MG chewable tablet Chew 81 mg by mouth daily with breakfast.       . Chlorhexidine Gluconate (PERIOGARD MT) Use as directed 0.5 oz in the mouth or throat at bedtime.       Marland Kitchen levothyroxine (SYNTHROID, LEVOTHROID) 125 MCG tablet Take 125 mcg by mouth daily with breakfast.       . lidocaine-prilocaine (EMLA) cream Apply 1 application topically as needed. Apply approx 1/2 tsp to skin, over port-a-cath prior to chemotherapy treatments.  30 g  2  . lisinopril (PRINIVIL,ZESTRIL) 40 MG tablet Take 40 mg by mouth every morning.      . Multiple Vitamin (MULITIVITAMIN WITH MINERALS) TABS Take 1 tablet by mouth every morning.      . prochlorperazine (COMPAZINE) 10 MG tablet Take 1 tablet (10 mg total) by mouth every 6 (six) hours as needed for nausea or vomiting.  60 tablet  1  . simvastatin (ZOCOR) 40 MG tablet Take 40 mg by mouth at bedtime.      . traMADol (ULTRAM) 50 MG tablet Take 1 tablet (50 mg total) by mouth daily as needed for moderate pain.  30 tablet  0  . [DISCONTINUED] hydrALAZINE (APRESOLINE) 25 MG tablet Take 12.5 mg by mouth every morning.       No current facility-administered medications for this visit.    Allergies: No Known Allergies  Past Medical History, Surgical history, Social history, and Family History were reviewed and updated.    Physical Exam: Blood pressure 114/54, pulse 70, temperature 98.8 F (37.1 C), temperature source Oral, resp. rate 18, height  5\' 11"  (1.803 m), weight 193 lb 11.2 oz (87.862 kg). ECOG: 1 General appearance: alert awake not in any distress. Head: Atraumatic no abnormalities Neck: no adenopathy, no masses palpated. Lymph nodes: Cervical, supraclavicular, and axillary nodes normal. Heart:regular rate and rhythm, S1, S2. No murmurs rubs or gallops. Lung:chest clear, no wheezing, rales, normal symmetric air entry no dullness to percussion. Abdomen: soft,  non-tender, without masses or organomegaly good bowel sounds noted. EXT:no erythema, induration, or nodules Neurological examination: No deficits at this time noted.   Lab Results: Lab Results  Component Value Date   WBC 3.3* 03/02/2014   HGB 7.2* 03/02/2014   HCT 21.6* 03/02/2014   MCV 91.9 03/02/2014   PLT 100* 03/02/2014     Chemistry      Component Value Date/Time   NA 134* 02/16/2014 0919   NA 140 02/24/2012 1249   K 3.8 02/16/2014 0919   K 3.9 02/24/2012 1249   CL 106 12/27/2012 0801   CL 110 02/24/2012 1249   CO2 21* 02/16/2014 0919   CO2 23 02/24/2012 1249   BUN 23.8 02/16/2014 0919   BUN 18 02/24/2012 1249   CREATININE 1.4* 02/16/2014 0919   CREATININE 0.98 02/24/2012 1249      Component Value Date/Time   CALCIUM 9.5 02/16/2014 0919   CALCIUM 8.9 02/24/2012 1249   ALKPHOS 78 02/16/2014 0919   ALKPHOS 85 02/24/2012 1249   AST 23 02/16/2014 0919   AST 18 02/24/2012 1249   ALT 18 02/16/2014 0919   ALT 12 02/24/2012 1249   BILITOT 1.28* 02/16/2014 0919   BILITOT 0.6 02/24/2012 1249      Impression and Plan:  73 year old with the following issue:  1.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 He developed pelvic recurrence and he is currently being treated with systemic chemotherapy with carboplatin and gemcitabine. CT scan on 12/13/2013  showed stable disease. I plan on continuing chemotherapy with a total of 6 cycles. His hemoglobin is low today and his blood counts as borderline. I will hold off on chemotherapy today and delayed the start of chemotherapy cycle 6 to 03/09/2014. He'll receive a combination of carboplatin and Gemzar on that day. Date of the cycle which is Gemzar we will be eliminated. A repeat CT scan on 03/21/2014 for staging purposes. We'll determine further chemotherapy based on these results.  2. Pelvic discomfort: This could be related to this infiltrative mass. Pain is resolved now with chemotherapy.  3. Anti-emetics: He has  Compazine at home.  4. Anemia: She has mild symptoms and it is likely related to chemotherapy. I will set him up for packed red cell transfusions today instead of the chemotherapy.  5. IV access: Port-A-Cath has been placed without any complications.  6. Followup: He will followup on 828 for day 1 of cycle 6 of chemotherapy. They it will be eliminated the cycle. He will also have a CT scan on 03/21/2014 and M.D. followup on 03/23/2014.  CHYIFO,YDXAJ  8/21/20159:33 AM

## 2014-03-03 LAB — TYPE AND SCREEN
ABO/RH(D): O POS
ANTIBODY SCREEN: NEGATIVE
Unit division: 0

## 2014-03-09 ENCOUNTER — Ambulatory Visit (HOSPITAL_BASED_OUTPATIENT_CLINIC_OR_DEPARTMENT_OTHER): Payer: Medicare Other

## 2014-03-09 ENCOUNTER — Other Ambulatory Visit: Payer: Self-pay | Admitting: Oncology

## 2014-03-09 ENCOUNTER — Ambulatory Visit: Payer: Medicare Other

## 2014-03-09 VITALS — BP 114/71 | HR 65 | Temp 97.5°F | Resp 18

## 2014-03-09 DIAGNOSIS — Z5111 Encounter for antineoplastic chemotherapy: Secondary | ICD-10-CM

## 2014-03-09 DIAGNOSIS — C679 Malignant neoplasm of bladder, unspecified: Secondary | ICD-10-CM

## 2014-03-09 DIAGNOSIS — C670 Malignant neoplasm of trigone of bladder: Secondary | ICD-10-CM

## 2014-03-09 DIAGNOSIS — Z95828 Presence of other vascular implants and grafts: Secondary | ICD-10-CM

## 2014-03-09 LAB — CBC WITH DIFFERENTIAL/PLATELET
BASO%: 0.4 % (ref 0.0–2.0)
BASOS ABS: 0 10*3/uL (ref 0.0–0.1)
EOS%: 0 % (ref 0.0–7.0)
Eosinophils Absolute: 0 10*3/uL (ref 0.0–0.5)
HEMATOCRIT: 26.9 % — AB (ref 38.4–49.9)
HEMOGLOBIN: 8.6 g/dL — AB (ref 13.0–17.1)
LYMPH%: 17.1 % (ref 14.0–49.0)
MCH: 30 pg (ref 27.2–33.4)
MCHC: 32 g/dL (ref 32.0–36.0)
MCV: 93.7 fL (ref 79.3–98.0)
MONO#: 1.2 10*3/uL — ABNORMAL HIGH (ref 0.1–0.9)
MONO%: 23.6 % — AB (ref 0.0–14.0)
NEUT%: 58.9 % (ref 39.0–75.0)
NEUTROS ABS: 3 10*3/uL (ref 1.5–6.5)
PLATELETS: 286 10*3/uL (ref 140–400)
RBC: 2.87 10*6/uL — ABNORMAL LOW (ref 4.20–5.82)
RDW: 15.3 % — ABNORMAL HIGH (ref 11.0–14.6)
WBC: 5.2 10*3/uL (ref 4.0–10.3)
lymph#: 0.9 10*3/uL (ref 0.9–3.3)

## 2014-03-09 LAB — COMPREHENSIVE METABOLIC PANEL (CC13)
ALT: 77 U/L — ABNORMAL HIGH (ref 0–55)
AST: 46 U/L — ABNORMAL HIGH (ref 5–34)
Albumin: 2.8 g/dL — ABNORMAL LOW (ref 3.5–5.0)
Alkaline Phosphatase: 127 U/L (ref 40–150)
Anion Gap: 9 meq/L (ref 3–11)
BUN: 19.1 mg/dL (ref 7.0–26.0)
CO2: 22 meq/L (ref 22–29)
Calcium: 9.3 mg/dL (ref 8.4–10.4)
Chloride: 109 meq/L (ref 98–109)
Creatinine: 1.2 mg/dL (ref 0.7–1.3)
Glucose: 125 mg/dL (ref 70–140)
Potassium: 3.8 meq/L (ref 3.5–5.1)
Sodium: 140 meq/L (ref 136–145)
Total Bilirubin: 0.45 mg/dL (ref 0.20–1.20)
Total Protein: 7.6 g/dL (ref 6.4–8.3)

## 2014-03-09 LAB — TECHNOLOGIST REVIEW

## 2014-03-09 MED ORDER — SODIUM CHLORIDE 0.9 % IJ SOLN
10.0000 mL | INTRAMUSCULAR | Status: DC | PRN
  Administered 2014-03-09: 10 mL
  Filled 2014-03-09: qty 10

## 2014-03-09 MED ORDER — DEXAMETHASONE SODIUM PHOSPHATE 20 MG/5ML IJ SOLN
INTRAMUSCULAR | Status: AC
  Filled 2014-03-09: qty 5

## 2014-03-09 MED ORDER — GEMCITABINE HCL CHEMO INJECTION 1 GM/26.3ML
1000.0000 mg/m2 | Freq: Once | INTRAVENOUS | Status: AC
  Administered 2014-03-09: 2204 mg via INTRAVENOUS
  Filled 2014-03-09: qty 57.97

## 2014-03-09 MED ORDER — SODIUM CHLORIDE 0.9 % IV SOLN
Freq: Once | INTRAVENOUS | Status: AC
  Administered 2014-03-09: 11:00:00 via INTRAVENOUS

## 2014-03-09 MED ORDER — HEPARIN SOD (PORK) LOCK FLUSH 100 UNIT/ML IV SOLN
500.0000 [IU] | Freq: Once | INTRAVENOUS | Status: AC | PRN
  Administered 2014-03-09: 500 [IU]
  Filled 2014-03-09: qty 5

## 2014-03-09 MED ORDER — SODIUM CHLORIDE 0.9 % IV SOLN
451.5000 mg | Freq: Once | INTRAVENOUS | Status: AC
  Administered 2014-03-09: 450 mg via INTRAVENOUS
  Filled 2014-03-09: qty 45

## 2014-03-09 MED ORDER — SODIUM CHLORIDE 0.9 % IJ SOLN
10.0000 mL | INTRAMUSCULAR | Status: DC | PRN
  Administered 2014-03-09: 10 mL via INTRAVENOUS
  Filled 2014-03-09: qty 10

## 2014-03-09 MED ORDER — ONDANSETRON 16 MG/50ML IVPB (CHCC)
16.0000 mg | Freq: Once | INTRAVENOUS | Status: AC
  Administered 2014-03-09: 16 mg via INTRAVENOUS

## 2014-03-09 MED ORDER — DEXAMETHASONE SODIUM PHOSPHATE 20 MG/5ML IJ SOLN
20.0000 mg | Freq: Once | INTRAMUSCULAR | Status: AC
  Administered 2014-03-09: 20 mg via INTRAVENOUS

## 2014-03-09 MED ORDER — ONDANSETRON 16 MG/50ML IVPB (CHCC)
INTRAVENOUS | Status: AC
  Filled 2014-03-09: qty 16

## 2014-03-09 NOTE — Patient Instructions (Signed)
[facility] Discharge Instructions for Patients Receiving Chemotherapy  Today you received the following chemotherapy agents Gemcitabine/Carboplatin.  To help prevent nausea and vomiting after your treatment, we encourage you to take your nausea medication as directed.   If you develop nausea and vomiting that is not controlled by your nausea medication, call the clinic.   BELOW ARE SYMPTOMS THAT SHOULD BE REPORTED IMMEDIATELY:  *FEVER GREATER THAN 100.5 F  *CHILLS WITH OR WITHOUT FEVER  NAUSEA AND VOMITING THAT IS NOT CONTROLLED WITH YOUR NAUSEA MEDICATION  *UNUSUAL SHORTNESS OF BREATH  *UNUSUAL BRUISING OR BLEEDING  TENDERNESS IN MOUTH AND THROAT WITH OR WITHOUT PRESENCE OF ULCERS  *URINARY PROBLEMS  *BOWEL PROBLEMS  UNUSUAL RASH Items with * indicate a potential emergency and should be followed up as soon as possible.  Feel free to call the clinic you have any questions or concerns. The clinic phone number is [phone].    

## 2014-03-21 ENCOUNTER — Ambulatory Visit (HOSPITAL_BASED_OUTPATIENT_CLINIC_OR_DEPARTMENT_OTHER): Payer: Medicare Other

## 2014-03-21 ENCOUNTER — Ambulatory Visit (HOSPITAL_COMMUNITY)
Admission: RE | Admit: 2014-03-21 | Discharge: 2014-03-21 | Disposition: A | Discharge status: Home / Self Care | Payer: Medicare Other | Source: Ambulatory Visit | Attending: Oncology | Admitting: Oncology

## 2014-03-21 ENCOUNTER — Other Ambulatory Visit (HOSPITAL_BASED_OUTPATIENT_CLINIC_OR_DEPARTMENT_OTHER): Payer: Medicare Other

## 2014-03-21 ENCOUNTER — Encounter (HOSPITAL_COMMUNITY): Payer: Self-pay

## 2014-03-21 VITALS — BP 129/64 | HR 61 | Temp 97.5°F

## 2014-03-21 DIAGNOSIS — Q619 Cystic kidney disease, unspecified: Secondary | ICD-10-CM | POA: Diagnosis not present

## 2014-03-21 DIAGNOSIS — R911 Solitary pulmonary nodule: Secondary | ICD-10-CM | POA: Insufficient documentation

## 2014-03-21 DIAGNOSIS — R935 Abnormal findings on diagnostic imaging of other abdominal regions, including retroperitoneum: Secondary | ICD-10-CM | POA: Diagnosis not present

## 2014-03-21 DIAGNOSIS — C679 Malignant neoplasm of bladder, unspecified: Secondary | ICD-10-CM

## 2014-03-21 DIAGNOSIS — M8448XA Pathological fracture, other site, initial encounter for fracture: Secondary | ICD-10-CM | POA: Insufficient documentation

## 2014-03-21 DIAGNOSIS — Z95828 Presence of other vascular implants and grafts: Secondary | ICD-10-CM

## 2014-03-21 DIAGNOSIS — Z452 Encounter for adjustment and management of vascular access device: Secondary | ICD-10-CM

## 2014-03-21 LAB — CBC WITH DIFFERENTIAL/PLATELET
BASO%: 0.4 % (ref 0.0–2.0)
Basophils Absolute: 0 10*3/uL (ref 0.0–0.1)
EOS%: 0.1 % (ref 0.0–7.0)
Eosinophils Absolute: 0 10*3/uL (ref 0.0–0.5)
HCT: 23.8 % — ABNORMAL LOW (ref 38.4–49.9)
HGB: 7.8 g/dL — ABNORMAL LOW (ref 13.0–17.1)
LYMPH#: 0.9 10*3/uL (ref 0.9–3.3)
LYMPH%: 28.9 % (ref 14.0–49.0)
MCH: 30 pg (ref 27.2–33.4)
MCHC: 32.8 g/dL (ref 32.0–36.0)
MCV: 91.5 fL (ref 79.3–98.0)
MONO#: 0.6 10*3/uL (ref 0.1–0.9)
MONO%: 18.8 % — ABNORMAL HIGH (ref 0.0–14.0)
NEUT#: 1.7 10*3/uL (ref 1.5–6.5)
NEUT%: 51.8 % (ref 39.0–75.0)
Platelets: 55 10*3/uL — ABNORMAL LOW (ref 140–400)
RBC: 2.6 10*6/uL — ABNORMAL LOW (ref 4.20–5.82)
RDW: 15.9 % — ABNORMAL HIGH (ref 11.0–14.6)
WBC: 3.3 10*3/uL — ABNORMAL LOW (ref 4.0–10.3)

## 2014-03-21 LAB — COMPREHENSIVE METABOLIC PANEL (CC13)
ALK PHOS: 106 U/L (ref 40–150)
ALT: 46 U/L (ref 0–55)
AST: 45 U/L — ABNORMAL HIGH (ref 5–34)
Albumin: 3.4 g/dL — ABNORMAL LOW (ref 3.5–5.0)
Anion Gap: 10 mEq/L (ref 3–11)
BILIRUBIN TOTAL: 0.65 mg/dL (ref 0.20–1.20)
BUN: 18.6 mg/dL (ref 7.0–26.0)
CHLORIDE: 108 meq/L (ref 98–109)
CO2: 21 mEq/L — ABNORMAL LOW (ref 22–29)
CREATININE: 1.1 mg/dL (ref 0.7–1.3)
Calcium: 9.2 mg/dL (ref 8.4–10.4)
Glucose: 94 mg/dl (ref 70–140)
Potassium: 4.1 mEq/L (ref 3.5–5.1)
Sodium: 139 mEq/L (ref 136–145)
Total Protein: 7.7 g/dL (ref 6.4–8.3)

## 2014-03-21 MED ORDER — HEPARIN SOD (PORK) LOCK FLUSH 100 UNIT/ML IV SOLN
500.0000 [IU] | Freq: Once | INTRAVENOUS | Status: AC
  Administered 2014-03-21: 500 [IU] via INTRAVENOUS
  Filled 2014-03-21: qty 5

## 2014-03-21 MED ORDER — IOHEXOL 300 MG/ML  SOLN
125.0000 mL | Freq: Once | INTRAMUSCULAR | Status: AC | PRN
  Administered 2014-03-21: 125 mL via INTRAVENOUS

## 2014-03-21 MED ORDER — SODIUM CHLORIDE 0.9 % IJ SOLN
10.0000 mL | INTRAMUSCULAR | Status: DC | PRN
  Administered 2014-03-21: 10 mL via INTRAVENOUS
  Filled 2014-03-21: qty 10

## 2014-03-21 NOTE — Patient Instructions (Signed)

## 2014-03-23 ENCOUNTER — Encounter: Payer: Self-pay | Admitting: Oncology

## 2014-03-23 ENCOUNTER — Telehealth: Payer: Self-pay | Admitting: Oncology

## 2014-03-23 ENCOUNTER — Ambulatory Visit (HOSPITAL_BASED_OUTPATIENT_CLINIC_OR_DEPARTMENT_OTHER): Payer: Medicare Other | Admitting: Oncology

## 2014-03-23 VITALS — BP 108/58 | HR 77 | Temp 98.2°F | Resp 18 | Ht 71.0 in | Wt 197.5 lb

## 2014-03-23 DIAGNOSIS — D6481 Anemia due to antineoplastic chemotherapy: Secondary | ICD-10-CM

## 2014-03-23 DIAGNOSIS — T451X5A Adverse effect of antineoplastic and immunosuppressive drugs, initial encounter: Secondary | ICD-10-CM

## 2014-03-23 DIAGNOSIS — C679 Malignant neoplasm of bladder, unspecified: Secondary | ICD-10-CM

## 2014-03-23 NOTE — Telephone Encounter (Signed)
gv adn printed appt sched and avs for pt for NOV °

## 2014-03-23 NOTE — Progress Notes (Signed)
Hematology and Oncology Follow Up Visit  Joe Park [id number] June 04, 1941 73 y.o. 03/23/2014 4:28 PM   Principle Diagnosis: 73 year old man diagnosed with bladder cancer in 09/2011. Now he has recurrent advanced disease.  Prior Therapy: He is S/P cystoprostatectomy and bilateral pelvic lymph node dissection and creation of an ileal conduit done on 11/11/2011. 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 The pathological stage was T3b N0.  Secondary Diagnosis: Prostate cancer diagnosed in 2002. Not active at this time.   Current therapy: Systemic chemotherapy with carboplatin for an AUC of 5 given on day 1 and gemcitabine at 1000 mg per meter square given on days 1 and 8 every 3 weeks. He is S/P 6 cycles of therapy concluded on 03/09/2014.   Interim History:  [name] returns today for a follow up visit. He reports no complications at this time. She tolerated chemotherapy without any complications. His pelvic pain has resolved since starting chemotherapy and continues to report no need for pain medication. He is able to ambulate without any difficulty. He is not reporting any delayed complications. His fatigue will have also improved. He continues to have reasonable quality of life and continues to live independently. He dnies any fever chills, no nausea vomiting or night sweats. Denies cough or hemoptysis or hematemesis. Denies abdominal pain no hematochezia or melena. Has not reported any genitourinary complaints. He declined headaches or blurry vision or double vision. Did not report any syncope or seizures. Did not report any chest pain shortness of breath or cough or hemoptysis. Did not report any hematochezia or melena. Does not report any urinary symptoms. Does not report any musculoskeletal complaints. Did not report any arthralgias myalgias or rash or pruritus. Rest or view  of system is unremarkable.   Medications: I have reviewed the patient's current medications. Current Outpatient Prescriptions  Medication Sig Dispense Refill  . aspirin 81 MG chewable tablet Chew 81 mg by mouth daily with breakfast.       . Chlorhexidine Gluconate (PERIOGARD MT) Use as directed 0.5 oz in the mouth or throat at bedtime.       Marland Kitchen levothyroxine (SYNTHROID, LEVOTHROID) 125 MCG tablet Take 125 mcg by mouth daily with breakfast.       . lidocaine-prilocaine (EMLA) cream Apply 1 application topically as needed. Apply approx 1/2 tsp to skin, over port-a-cath prior to chemotherapy treatments.  30 g  2  . lisinopril (PRINIVIL,ZESTRIL) 40 MG tablet Take 40 mg by mouth every morning.      . Multiple Vitamin (MULITIVITAMIN WITH MINERALS) TABS Take 1 tablet by mouth every morning.      . prochlorperazine (COMPAZINE) 10 MG tablet Take 1 tablet (10 mg total) by mouth every 6 (six) hours as needed for nausea or vomiting.  60 tablet  1  . simvastatin (ZOCOR) 40 MG tablet Take 40 mg by mouth at bedtime.      . traMADol (ULTRAM) 50 MG tablet Take 1 tablet (50 mg total) by mouth daily as needed for moderate pain.  30 tablet  0  . [DISCONTINUED] hydrALAZINE (APRESOLINE) 25 MG tablet Take 12.5 mg by mouth every morning.       No current facility-administered medications for this visit.    Allergies: No Known Allergies  Past Medical History, Surgical history, Social history, and Family History were reviewed and updated.    Physical Exam: Blood pressure 108/58, pulse 77,  temperature 98.2 F (36.8 C), temperature source Oral, resp. rate 18, height 5\' 11"  (1.803 m), weight 197 lb 8 oz (89.585 kg). ECOG: 1 General appearance: alert awake not in any distress. Head: Atraumatic no abnormalities Neck: no adenopathy, no masses palpated. Lymph nodes: Cervical, supraclavicular, and axillary nodes normal. Heart:regular rate and rhythm, S1, S2. No murmurs rubs or gallops. Lung:chest clear, no wheezing,  rales, normal symmetric air entry no dullness to percussion. Abdomen: soft, non-tender, without masses or organomegaly good bowel sounds noted. EXT:no erythema, induration, or nodules Neurological examination: No deficits at this time noted.   Lab Results: Lab Results  Component Value Date   WBC 3.3* 03/21/2014   HGB 7.8* 03/21/2014   HCT 23.8* 03/21/2014   MCV 91.5 03/21/2014   PLT 55* 03/21/2014     Chemistry      Component Value Date/Time   NA 139 03/21/2014 0929   NA 140 02/24/2012 1249   K 4.1 03/21/2014 0929   K 3.9 02/24/2012 1249   CL 106 12/27/2012 0801   CL 110 02/24/2012 1249   CO2 21* 03/21/2014 0929   CO2 23 02/24/2012 1249   BUN 18.6 03/21/2014 0929   BUN 18 02/24/2012 1249   CREATININE 1.1 03/21/2014 0929   CREATININE 0.98 02/24/2012 1249      Component Value Date/Time   CALCIUM 9.2 03/21/2014 0929   CALCIUM 8.9 02/24/2012 1249   ALKPHOS 106 03/21/2014 0929   ALKPHOS 85 02/24/2012 1249   AST 45* 03/21/2014 0929   AST 18 02/24/2012 1249   ALT 46 03/21/2014 0929   ALT 12 02/24/2012 1249   BILITOT 0.65 03/21/2014 0929   BILITOT 0.6 02/24/2012 1249     EXAM:  CT CHEST, ABDOMEN, AND PELVIS WITH CONTRAST  TECHNIQUE:  Multidetector CT imaging of the chest, abdomen and pelvis was  performed following the standard protocol during bolus  administration of intravenous contrast.  CONTRAST: 137mL OMNIPAQUE IOHEXOL 300 MG/ML SOLN  COMPARISON: 12/13/2013  FINDINGS:  CT CHEST FINDINGS  There is no pleural effusion. Stable tiny subpleural nodule along  the heart zonal fissure measuring 2-3 mm, image 26/series 4. No new  pulmonary nodules identified.  The heart size is normal. No pericardial effusion. The trachea  appears patent and is midline. Normal appearance of the esophagus.  No enlarged mediastinal or hilar lymph nodes. There is no axillary  or supra clavicular adenopathy.  CT ABDOMEN AND PELVIS FINDINGS  Normal appearance of the liver. No suspicious liver lesion. The  gallbladder appears  normal. No biliary dilatation. Normal appearance  of the pancreas and spleen.  The adrenal glands are both normal. Several small right renal cysts  appear unchanged measuring up to 1 cm, image 68/series 2. Striated  nephrographic appearance of the left kidney is identified. There is  left-sided pelvocaliectasis and mild perinephric fat stranding which  is new from the previous exam. On the delayed images on the delayed  images there is symmetric excretion of contrast material into the  ureters. The patient is status post cystectomy. Right lower quadrant  ileal conduit is again noted. A small parastomal hernia at the  urostomy site is noted which contains fat only. Seed implants are  identified within the prostate gland.  Normal caliber of the abdominal aorta. There is mild calcified  atherosclerotic disease noted. No retroperitoneal adenopathy. There  is no pelvic or inguinal adenopathy.  The stomach appears normal. The small bowel loops have a normal  course and caliber without obstruction. Normal appearance  of the  colon.  Review of the visualized bony structures again shows pathologic  fractures involving the right superior and inferior pubic rami. The  right superior pubic rami fracture now extends into the anterior  column of the right acetabulum, image number 89/series 6. This may  be of orthopedic significance. Low-attenuation pelvic floor mass  measures 6.1 x 5.3 cm, image 99/series 6. Previously 8 x 7.2 cm.  There is a mild scoliosis deformity with multi level spondylosis  involving the lumbar spine.  IMPRESSION:  1. Hypo attenuating lesion within the pelvic floor is decreased in  size from previous exam.  2. Again seen are pathologic fractures involving the right superior  and inferior pubic rami. The superior pubic rami fracture has now  extended into the anterior column of the right acetabulum which may  be of orthopedic significance.  3. No mass or adenopathy identified  within the abdomen or pelvis.  4. Asymmetric striated nephrogram and perinephric fat stranding  involving the left kidney is new from previous exam and concerning  for pyelonephritis.  Electronically Signed  By: Kerby Moors M.D.  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 He developed pelvic recurrence and he is S/P 6 cycles of chemotherapy and have tolerated it well. CT scan on 03/21/2014 was reviewed today and continued to show positive response to chemotherapy. Options of treatments were discussed today including maintenance chemotherapy versus a treatment break and resumption of chemotherapy upon relapse. We have decided to give him a treatment break given the excellent response he had as well as accumulated complications particularly cytopenias. He understands he might have to go back on chemotherapy in the future as his disease relapse  2. Pelvic discomfort: This could be related to this infiltrative mass. Pain is resolved now with chemotherapy.  3. Anti-emetics: This is no longer an issue at this time.   4. Anemia: She has mild symptoms and it is likely related to chemotherapy. This will resolve and upon stopping systemic chemotherapy.  5. IV access: Port-A-Cath has been placed without any complications. As we'll be flushed every 2 months while he is on treatment.  6. Followup: In 2 months for an assessment.  [facility]  9/11/20154:28 PM

## 2014-04-27 ENCOUNTER — Other Ambulatory Visit: Payer: Self-pay

## 2014-05-17 ENCOUNTER — Telehealth: Payer: Self-pay | Admitting: Oncology

## 2014-05-17 ENCOUNTER — Other Ambulatory Visit (HOSPITAL_BASED_OUTPATIENT_CLINIC_OR_DEPARTMENT_OTHER): Payer: Medicare Other

## 2014-05-17 ENCOUNTER — Other Ambulatory Visit: Payer: Self-pay | Admitting: *Deleted

## 2014-05-17 ENCOUNTER — Ambulatory Visit (HOSPITAL_BASED_OUTPATIENT_CLINIC_OR_DEPARTMENT_OTHER): Payer: Medicare Other | Admitting: Oncology

## 2014-05-17 ENCOUNTER — Ambulatory Visit (HOSPITAL_BASED_OUTPATIENT_CLINIC_OR_DEPARTMENT_OTHER): Payer: Medicare Other

## 2014-05-17 ENCOUNTER — Encounter: Payer: Self-pay | Admitting: *Deleted

## 2014-05-17 VITALS — BP 136/71 | HR 63 | Temp 98.2°F | Resp 19 | Ht 71.0 in | Wt 195.3 lb

## 2014-05-17 DIAGNOSIS — C679 Malignant neoplasm of bladder, unspecified: Secondary | ICD-10-CM

## 2014-05-17 DIAGNOSIS — D649 Anemia, unspecified: Secondary | ICD-10-CM

## 2014-05-17 DIAGNOSIS — C7989 Secondary malignant neoplasm of other specified sites: Secondary | ICD-10-CM

## 2014-05-17 DIAGNOSIS — Z452 Encounter for adjustment and management of vascular access device: Secondary | ICD-10-CM

## 2014-05-17 DIAGNOSIS — Z95828 Presence of other vascular implants and grafts: Secondary | ICD-10-CM

## 2014-05-17 LAB — COMPREHENSIVE METABOLIC PANEL (CC13)
ALT: 15 U/L (ref 0–55)
AST: 27 U/L (ref 5–34)
Albumin: 3.7 g/dL (ref 3.5–5.0)
Alkaline Phosphatase: 103 U/L (ref 40–150)
Anion Gap: 9 meq/L (ref 3–11)
BUN: 17.6 mg/dL (ref 7.0–26.0)
CO2: 23 meq/L (ref 22–29)
Calcium: 9.7 mg/dL (ref 8.4–10.4)
Chloride: 106 meq/L (ref 98–109)
Creatinine: 1.3 mg/dL (ref 0.7–1.3)
Glucose: 102 mg/dL (ref 70–140)
Potassium: 3.8 meq/L (ref 3.5–5.1)
Sodium: 139 meq/L (ref 136–145)
Total Bilirubin: 0.58 mg/dL (ref 0.20–1.20)
Total Protein: 7.7 g/dL (ref 6.4–8.3)

## 2014-05-17 LAB — CBC WITH DIFFERENTIAL/PLATELET
BASO%: 0.9 % (ref 0.0–2.0)
Basophils Absolute: 0 10e3/uL (ref 0.0–0.1)
EOS%: 2.2 % (ref 0.0–7.0)
Eosinophils Absolute: 0.1 10e3/uL (ref 0.0–0.5)
HCT: 32.6 % — ABNORMAL LOW (ref 38.4–49.9)
HGB: 10.8 g/dL — ABNORMAL LOW (ref 13.0–17.1)
LYMPH%: 24.3 % (ref 14.0–49.0)
MCH: 30.1 pg (ref 27.2–33.4)
MCHC: 33.1 g/dL (ref 32.0–36.0)
MCV: 91 fL (ref 79.3–98.0)
MONO#: 0.7 10e3/uL (ref 0.1–0.9)
MONO%: 16 % — ABNORMAL HIGH (ref 0.0–14.0)
NEUT#: 2.5 10e3/uL (ref 1.5–6.5)
NEUT%: 56.6 % (ref 39.0–75.0)
Platelets: 179 10e3/uL (ref 140–400)
RBC: 3.59 10e6/uL — ABNORMAL LOW (ref 4.20–5.82)
RDW: 15.9 % — ABNORMAL HIGH (ref 11.0–14.6)
WBC: 4.4 10e3/uL (ref 4.0–10.3)
lymph#: 1.1 10e3/uL (ref 0.9–3.3)

## 2014-05-17 MED ORDER — HYDROCODONE-ACETAMINOPHEN 5-325 MG PO TABS
1.0000 | ORAL_TABLET | Freq: Four times a day (QID) | ORAL | Status: DC | PRN
Start: 2014-05-17 — End: 2014-06-26

## 2014-05-17 MED ORDER — SODIUM CHLORIDE 0.9 % IJ SOLN
10.0000 mL | INTRAMUSCULAR | Status: DC | PRN
  Administered 2014-05-17: 10 mL via INTRAVENOUS
  Filled 2014-05-17: qty 10

## 2014-05-17 MED ORDER — HEPARIN SOD (PORK) LOCK FLUSH 100 UNIT/ML IV SOLN
500.0000 [IU] | Freq: Once | INTRAVENOUS | Status: AC
  Administered 2014-05-17: 500 [IU] via INTRAVENOUS
  Filled 2014-05-17: qty 5

## 2014-05-17 MED ORDER — HYDROCODONE-ACETAMINOPHEN 5-500 MG PO TABS: 1.0000 | ORAL_TABLET | Freq: Four times a day (QID) | ORAL | Status: DC | PRN

## 2014-05-17 NOTE — Patient Instructions (Signed)

## 2014-05-17 NOTE — Telephone Encounter (Signed)
Gave avs & cal for Jan 2016. Also pt has the contrast for CT scan

## 2014-05-17 NOTE — Progress Notes (Signed)
Hematology and Oncology Follow Up Visit  Joe Park [id number] [birth year] 73 y.o. 05/17/2014 9:50 AM   Principle Diagnosis: 73 year old man diagnosed with bladder cancer in 09/2011. Now he has recurrent advanced disease.  Prior Therapy: He is S/P cystoprostatectomy and bilateral pelvic lymph node dissection and creation of an ileal conduit done on 11/11/2011. 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 The pathological stage was T3b N0.  Secondary Diagnosis: Prostate cancer diagnosed in 2002. Not active at this time.   Current therapy: Systemic chemotherapy with carboplatin for an AUC of 5 given on day 1 and gemcitabine at 1000 mg per meter square given on days 1 and 8 every 3 weeks. He is S/P 6 cycles of therapy concluded on 03/09/2014. He is currently on a treatment break.  Interim History:  Joe Park returns today for a follow up visit. Since the last visit, he has been doing very well. He did report right thigh and groin pain which is slightly different than previous pain. He is able to ambulate without any difficulty. He is not reporting any neurological deficits or weakness.Marland Kitchen His fatigue will have also improved. He continues to have reasonable quality of life and continues to live independently. His appetite has been excellent.He dnies any fever chills, no nausea vomiting or night sweats. Denies cough or hemoptysis or hematemesis. Denies abdominal pain no hematochezia or melena. Has not reported any genitourinary complaints. He declined headaches or blurry vision or double vision. Did not report any syncope or seizures. Did not report any chest pain shortness of breath or cough or hemoptysis. Did not report any hematochezia or melena. Does not report any urinary symptoms. Does not report any musculoskeletal complaints. Did not report any arthralgias myalgias or rash or  pruritus. Rest or view of system is unremarkable.   Medications: I have reviewed the patient's current medications. Current Outpatient Prescriptions  Medication Sig Dispense Refill  . aspirin 81 MG chewable tablet Chew 81 mg by mouth daily with breakfast.     . Chlorhexidine Gluconate (PERIOGARD MT) Use as directed 0.5 oz in the mouth or throat at bedtime.     Marland Kitchen HYDROcodone-acetaminophen (VICODIN) 5-500 MG per tablet Take 1 tablet by mouth every 6 (six) hours as needed for pain. 30 tablet 0  . levothyroxine (SYNTHROID, LEVOTHROID) 125 MCG tablet Take 125 mcg by mouth daily with breakfast.     . lidocaine-prilocaine (EMLA) cream Apply 1 application topically as needed. Apply approx 1/2 tsp to skin, over port-a-cath prior to chemotherapy treatments. 30 g 2  . lisinopril (PRINIVIL,ZESTRIL) 40 MG tablet Take 40 mg by mouth every morning.    . Multiple Vitamin (MULITIVITAMIN WITH MINERALS) TABS Take 1 tablet by mouth every morning.    . prochlorperazine (COMPAZINE) 10 MG tablet Take 1 tablet (10 mg total) by mouth every 6 (six) hours as needed for nausea or vomiting. 60 tablet 1  . simvastatin (ZOCOR) 40 MG tablet Take 40 mg by mouth at bedtime.    . traMADol (ULTRAM) 50 MG tablet Take 1 tablet (50 mg total) by mouth daily as needed for moderate pain. 30 tablet 0  . [DISCONTINUED] hydrALAZINE (APRESOLINE) 25 MG tablet Take 12.5 mg by mouth every morning.     No current facility-administered medications for this visit.    Allergies: No Known Allergies  Past Medical History, Surgical history, Social history, and Family History were reviewed and  updated.    Physical Exam: Blood pressure 136/71, pulse 63, temperature 98.2 F (36.8 C), temperature source Oral, resp. rate 19, height 5\' 11"  (1.803 m), weight 195 lb 4.8 oz (88.587 kg), SpO2 100 %. ECOG: 1 General appearance: alert awake not in any distress. Head: Atraumatic no abnormalities Neck: no adenopathy, no masses palpated. Lymph nodes:  Cervical, supraclavicular, and axillary nodes normal. Heart:regular rate and rhythm, S1, S2. No murmurs rubs or gallops. Lung:chest clear, no wheezing, rales, normal symmetric air entry no dullness to percussion. Abdomen: soft, non-tender, without masses or organomegaly good bowel sounds noted. EXT:no erythema, induration, or nodules Neurological examination: No deficits at this time noted.   Lab Results: Lab Results  Component Value Date   WBC 4.4 05/17/2014   HGB 10.8* 05/17/2014   HCT 32.6* 05/17/2014   MCV 91.0 05/17/2014   PLT 179 05/17/2014     Chemistry      Component Value Date/Time   NA 139 03/21/2014 0929   NA 140 02/24/2012 1249   K 4.1 03/21/2014 0929   K 3.9 02/24/2012 1249   CL 106 12/27/2012 0801   CL 110 02/24/2012 1249   CO2 21* 03/21/2014 0929   CO2 23 02/24/2012 1249   BUN 18.6 03/21/2014 0929   BUN 18 02/24/2012 1249   CREATININE 1.1 03/21/2014 0929   CREATININE 0.98 02/24/2012 1249      Component Value Date/Time   CALCIUM 9.2 03/21/2014 0929   CALCIUM 8.9 02/24/2012 1249   ALKPHOS 106 03/21/2014 0929   ALKPHOS 85 02/24/2012 1249   AST 45* 03/21/2014 0929   AST 18 02/24/2012 1249   ALT 46 03/21/2014 0929   ALT 12 02/24/2012 1249   BILITOT 0.65 03/21/2014 0929   BILITOT 0.6 02/24/2012 1249       Impression and Plan:  73 year old with the following issue:  1.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 He developed pelvic recurrence and he is S/P 6 cycles of chemotherapy and have tolerated it well. CT scan on 03/21/2014 showed  positive response to chemotherapy. He is currently on a treatment break and would like to stay on it as long as possible. I plan on repeating his CT scan in 2 months to assess the status of his cancer   2. Right thigh pain: Unclear etiology but could be related to his tumor. I gave a prescription for Vicodin today and will repeat imaging studies to assist at 40.  3.  Anti-emetics: This is no longer an issue at this time.   4. Anemia: She has mild symptoms and it is likely related to chemotherapy. His hemoglobin is better.  5. IV access: Port-A-Cath will be flushed routinely every 2 months.  6. Followup: In 2 months for an assessment.  Shealynn Saulnier  11/5/20159:50 AM

## 2014-06-25 ENCOUNTER — Encounter (HOSPITAL_COMMUNITY): Payer: Self-pay

## 2014-06-25 ENCOUNTER — Emergency Department (HOSPITAL_COMMUNITY)
Admission: EM | Admit: 2014-06-25 | Discharge: 2014-06-26 | Disposition: A | Discharge status: Home / Self Care | Payer: Medicare Other | Attending: Emergency Medicine | Admitting: Emergency Medicine

## 2014-06-25 ENCOUNTER — Emergency Department (HOSPITAL_COMMUNITY): Payer: Medicare Other

## 2014-06-25 DIAGNOSIS — I10 Essential (primary) hypertension: Secondary | ICD-10-CM | POA: Diagnosis not present

## 2014-06-25 DIAGNOSIS — Z7982 Long term (current) use of aspirin: Secondary | ICD-10-CM | POA: Insufficient documentation

## 2014-06-25 DIAGNOSIS — Z8551 Personal history of malignant neoplasm of bladder: Secondary | ICD-10-CM | POA: Diagnosis not present

## 2014-06-25 DIAGNOSIS — Z8669 Personal history of other diseases of the nervous system and sense organs: Secondary | ICD-10-CM | POA: Diagnosis not present

## 2014-06-25 DIAGNOSIS — Z79899 Other long term (current) drug therapy: Secondary | ICD-10-CM | POA: Insufficient documentation

## 2014-06-25 DIAGNOSIS — Z87448 Personal history of other diseases of urinary system: Secondary | ICD-10-CM | POA: Insufficient documentation

## 2014-06-25 DIAGNOSIS — E039 Hypothyroidism, unspecified: Secondary | ICD-10-CM | POA: Diagnosis not present

## 2014-06-25 DIAGNOSIS — M84454S Pathological fracture, pelvis, sequela: Secondary | ICD-10-CM

## 2014-06-25 DIAGNOSIS — Z8546 Personal history of malignant neoplasm of prostate: Secondary | ICD-10-CM | POA: Diagnosis not present

## 2014-06-25 DIAGNOSIS — M25551 Pain in right hip: Secondary | ICD-10-CM | POA: Diagnosis present

## 2014-06-25 DIAGNOSIS — Z8719 Personal history of other diseases of the digestive system: Secondary | ICD-10-CM | POA: Diagnosis not present

## 2014-06-25 DIAGNOSIS — E780 Pure hypercholesterolemia: Secondary | ICD-10-CM | POA: Insufficient documentation

## 2014-06-25 DIAGNOSIS — M25559 Pain in unspecified hip: Secondary | ICD-10-CM

## 2014-06-25 MED ORDER — HYDROCODONE-ACETAMINOPHEN 5-325 MG PO TABS
2.0000 | ORAL_TABLET | Freq: Once | ORAL | Status: AC
  Administered 2014-06-25: 2 via ORAL
  Filled 2014-06-25: qty 2

## 2014-06-25 MED ORDER — HYDROCODONE-ACETAMINOPHEN 5-325 MG PO TABS: 1.0000 | ORAL_TABLET | Freq: Once | ORAL | Status: DC

## 2014-06-25 NOTE — ED Provider Notes (Signed)
CSN: [id number]     Arrival date & time 06/25/14  2210 History   First MD Initiated Contact with Patient 06/25/14 2239     Chief Complaint  Patient presents with  . Hip Pain      HPI  Patient presents evaluation of pelvic pain. History of metastatic prostate cancer. Has known bony metastases pelvic pathologic fractures. States he intermittently has pain and has had worsening pain over the last few weeks. No new falls or injuries or trauma. Points to his low buttock and in the area of positional tuberosities is his area of pain.  Past Medical History  Diagnosis Date  . Hypertension   . Elevated cholesterol   . GERD (gastroesophageal reflux disease)   . Bladder tumor   . Difficulty urinating   . Hypothyroidism     UNSURE IF TOO LOW OR TOO HIGH  . Difficulty sleeping   . Speech impediment     SINCE BIRTH  . Sleep apnea     STOP BANG SCORE 5  . Cancer     HX PROSTATE CANCER AND BLADDER CANCER   Past Surgical History  Procedure Laterality Date  . Cataracts  2012    RT CATARACT REMOVED  . Radioactive seed implant  1990'S    PROSTATE CANCER  . Hernia repair  1980'S  . Transurethral resection of bladder tumor  09/22/2011    Procedure: TRANSURETHRAL RESECTION OF BLADDER TUMOR (TURBT);  Surgeon: Malka So, MD;  Location: [facility];  Service: Urology;  Laterality: N/A;  Examination Under Anesthesia/TUR-BT with Gyrus  . Cystectomy  11/11/2011    Procedure: CYSTECTOMY COMPLETE;  Surgeon: Franchot Gallo, MD;  Location: [facility];  Service: Urology;  Laterality: N/A;  RADICAL CYSTECTOMY WITH NODE DISSECTION AND ILEAL CONDUIT   . Lymph node dissection  11/11/2011    Procedure: LYMPH NODE DISSECTION;  Surgeon: Franchot Gallo, MD;  Location: [facility];  Service: Urology;  Laterality: N/A;   No family history on file. History  Substance Use Topics  . Smoking status: Never Smoker   . Smokeless tobacco: Never Used  . Alcohol Use: No    Review of Systems  Constitutional: Negative for  fever, chills, diaphoresis, appetite change and fatigue.  HENT: Negative for mouth sores, sore throat and trouble swallowing.   Eyes: Negative for visual disturbance.  Respiratory: Negative for cough, chest tightness, shortness of breath and wheezing.   Cardiovascular: Negative for chest pain.  Gastrointestinal: Negative for nausea, vomiting, abdominal pain, diarrhea and abdominal distention.  Endocrine: Negative for polydipsia, polyphagia and polyuria.  Genitourinary: Negative for dysuria, frequency and hematuria.  Musculoskeletal: Negative for gait problem.       Pelvic pain  Skin: Negative for color change, pallor and rash.  Neurological: Negative for dizziness, syncope, light-headedness and headaches.  Hematological: Does not bruise/bleed easily.  Psychiatric/Behavioral: Negative for behavioral problems and confusion.      Allergies  Review of patient's allergies indicates no known allergies.  Home Medications   Prior to Admission medications   Medication Sig Start Date End Date Taking? Authorizing Provider  aspirin 81 MG chewable tablet Chew 81 mg by mouth daily with breakfast.    Yes Historical Provider, MD  Chlorhexidine Gluconate (PERIOGARD MT) Use as directed 0.5 oz in the mouth or throat at bedtime.    Yes Historical Provider, MD  levothyroxine (SYNTHROID, LEVOTHROID) 125 MCG tablet Take 125 mcg by mouth daily with breakfast.    Yes Historical Provider, MD  lidocaine-prilocaine (EMLA) cream Apply 1  application topically as needed. Apply approx 1/2 tsp to skin, over port-a-cath prior to chemotherapy treatments. 12/29/13  Yes Wyatt Portela, MD  lisinopril (PRINIVIL,ZESTRIL) 40 MG tablet Take 40 mg by mouth every morning.   Yes Historical Provider, MD  Multiple Vitamin (MULITIVITAMIN WITH MINERALS) TABS Take 1 tablet by mouth every morning.   Yes Historical Provider, MD  naproxen sodium (ANAPROX) 220 MG tablet Take 660 mg by mouth 2 (two) times daily as needed (for pain.).   Yes  Historical Provider, MD  prochlorperazine (COMPAZINE) 10 MG tablet Take 1 tablet (10 mg total) by mouth every 6 (six) hours as needed for nausea or vomiting. 10/13/13  Yes Maryanna Shape, NP  simvastatin (ZOCOR) 40 MG tablet Take 40 mg by mouth at bedtime.   Yes Historical Provider, MD  HYDROcodone-acetaminophen (NORCO/VICODIN) 5-325 MG per tablet Take 1 tablet by mouth every 6 (six) hours as needed. Patient not taking: Reported on 06/25/2014 05/17/14   Wyatt Portela, MD   BP 144/79 mmHg  Pulse 91  Temp(Src) 97.9 F (36.6 C) (Oral)  Resp 22  SpO2 96% Physical Exam  Constitutional: He is oriented to person, place, and time. He appears well-developed and well-nourished. No distress.  HENT:  Head: Normocephalic.  Eyes: Conjunctivae are normal. Pupils are equal, round, and reactive to light. No scleral icterus.  Neck: Normal range of motion. Neck supple. No thyromegaly present.  Cardiovascular: Normal rate and regular rhythm.  Exam reveals no gallop and no friction rub.   No murmur heard. Pulmonary/Chest: Effort normal and breath sounds normal. No respiratory distress. He has no wheezes. He has no rales.  Abdominal: Soft. Bowel sounds are normal. He exhibits no distension. There is no tenderness. There is no rebound.  Genitourinary:     Pain and tenderness in the area of the initial tuberosities. No anterior pelvic pain. No pain over the trochanter of the hips.  Musculoskeletal: Normal range of motion.  Neurological: He is alert and oriented to person, place, and time.  Skin: Skin is warm and dry. No rash noted.  Psychiatric: He has a normal mood and affect. His behavior is normal.    ED Course  Procedures (including critical care time) Labs Review Labs Reviewed - No data to display  Imaging Review No results found.   EKG Interpretation None      MDM   Final diagnoses:  None    X-ray show no new abnormalities. Do show persistence of his pathologic pelvic fractures. No  new injuries or episodes. I think is appropriate for outpatient treatment. He has an appointment tomorrow with his physician. Given a prescription for hydrocodone for his pain.    Tanna Furry, MD 06/26/14 [phone]

## 2014-06-25 NOTE — ED Notes (Signed)
Pt presents with c/o right hip pain. Pt is a cancer patient, not undergoing chemo at this time. Pt reports that he thinks he has bone cancer. Pt says he has a right hip fracture and that is what is causing him pain, pt unsure of when he injured his hip.

## 2014-06-26 MED ORDER — HYDROCODONE-ACETAMINOPHEN 5-325 MG PO TABS: 2.0000 | ORAL_TABLET | ORAL | Status: DC | PRN

## 2014-06-26 NOTE — Discharge Instructions (Signed)
Hip Pain Your hip is the joint between your upper legs and your lower pelvis. The bones, cartilage, tendons, and muscles of your hip joint perform a lot of work each day supporting your body weight and allowing you to move around. Hip pain can range from a minor ache to severe pain in one or both of your hips. Pain may be felt on the inside of the hip joint near the groin, or the outside near the buttocks and upper thigh. You may have swelling or stiffness as well.  HOME CARE INSTRUCTIONS   Take medicines only as directed by your health care provider.  Apply ice to the injured area:  Put ice in a plastic bag.  Place a towel between your skin and the bag.  Leave the ice on for 15-20 minutes at a time, 3-4 times a day.  Keep your leg raised (elevated) when possible to lessen swelling.  Avoid activities that cause pain.  Follow specific exercises as directed by your health care provider.  Sleep with a pillow between your legs on your most comfortable side.  Record how often you have hip pain, the location of the pain, and what it feels like. SEEK MEDICAL CARE IF:   You are unable to put weight on your leg.  Your hip is red or swollen or very tender to touch.  Your pain or swelling continues or worsens after 1 week.  You have increasing difficulty walking.  You have a fever. SEEK IMMEDIATE MEDICAL CARE IF:   You have fallen.  You have a sudden increase in pain and swelling in your hip. MAKE SURE YOU:   Understand these instructions.  Will watch your condition.  Will get help right away if you are not doing well or get worse. Document Released: 12/17/2009 Document Revised: 11/13/2013 Document Reviewed: 02/23/2013 [employer] Patient Information 2015 [employer]. This information is not intended to replace advice given to you by your health care provider. Make sure you discuss any questions you have with your health care provider.  

## 2014-07-17 ENCOUNTER — Encounter (HOSPITAL_COMMUNITY): Payer: Self-pay

## 2014-07-17 ENCOUNTER — Other Ambulatory Visit (HOSPITAL_BASED_OUTPATIENT_CLINIC_OR_DEPARTMENT_OTHER): Payer: PPO

## 2014-07-17 ENCOUNTER — Ambulatory Visit (HOSPITAL_BASED_OUTPATIENT_CLINIC_OR_DEPARTMENT_OTHER): Payer: PPO

## 2014-07-17 ENCOUNTER — Ambulatory Visit (HOSPITAL_COMMUNITY)
Admission: RE | Admit: 2014-07-17 | Discharge: 2014-07-17 | Disposition: A | Discharge status: Home / Self Care | Payer: PPO | Source: Ambulatory Visit | Attending: Oncology | Admitting: Oncology

## 2014-07-17 VITALS — BP 128/85 | HR 68 | Temp 98.0°F

## 2014-07-17 DIAGNOSIS — C679 Malignant neoplasm of bladder, unspecified: Secondary | ICD-10-CM | POA: Insufficient documentation

## 2014-07-17 DIAGNOSIS — Z95828 Presence of other vascular implants and grafts: Secondary | ICD-10-CM

## 2014-07-17 DIAGNOSIS — R102 Pelvic and perineal pain: Secondary | ICD-10-CM | POA: Insufficient documentation

## 2014-07-17 DIAGNOSIS — D649 Anemia, unspecified: Secondary | ICD-10-CM

## 2014-07-17 DIAGNOSIS — M25551 Pain in right hip: Secondary | ICD-10-CM | POA: Insufficient documentation

## 2014-07-17 DIAGNOSIS — Z452 Encounter for adjustment and management of vascular access device: Secondary | ICD-10-CM

## 2014-07-17 LAB — CBC WITH DIFFERENTIAL/PLATELET
BASO%: 0.5 % (ref 0.0–2.0)
BASOS ABS: 0 10*3/uL (ref 0.0–0.1)
EOS ABS: 0.1 10*3/uL (ref 0.0–0.5)
EOS%: 0.9 % (ref 0.0–7.0)
HCT: 32.9 % — ABNORMAL LOW (ref 38.4–49.9)
HGB: 10.6 g/dL — ABNORMAL LOW (ref 13.0–17.1)
LYMPH%: 21.8 % (ref 14.0–49.0)
MCH: 27.8 pg (ref 27.2–33.4)
MCHC: 32.3 g/dL (ref 32.0–36.0)
MCV: 86.2 fL (ref 79.3–98.0)
MONO#: 0.8 10*3/uL (ref 0.1–0.9)
MONO%: 13.9 % (ref 0.0–14.0)
NEUT#: 3.8 10*3/uL (ref 1.5–6.5)
NEUT%: 62.9 % (ref 39.0–75.0)
Platelets: 246 10*3/uL (ref 140–400)
RBC: 3.82 10*6/uL — ABNORMAL LOW (ref 4.20–5.82)
RDW: 16 % — ABNORMAL HIGH (ref 11.0–14.6)
WBC: 6 10*3/uL (ref 4.0–10.3)
lymph#: 1.3 10*3/uL (ref 0.9–3.3)

## 2014-07-17 LAB — COMPREHENSIVE METABOLIC PANEL (CC13)
ALT: 14 U/L (ref 0–55)
AST: 24 U/L (ref 5–34)
Albumin: 3.5 g/dL (ref 3.5–5.0)
Alkaline Phosphatase: 108 U/L (ref 40–150)
Anion Gap: 10 mEq/L (ref 3–11)
BILIRUBIN TOTAL: 0.54 mg/dL (ref 0.20–1.20)
BUN: 19.1 mg/dL (ref 7.0–26.0)
CO2: 25 meq/L (ref 22–29)
CREATININE: 1 mg/dL (ref 0.7–1.3)
Calcium: 10 mg/dL (ref 8.4–10.4)
Chloride: 105 mEq/L (ref 98–109)
EGFR: 73 mL/min/{1.73_m2} — ABNORMAL LOW (ref >90)
Glucose: 98 mg/dl (ref 70–140)
Potassium: 3.9 mEq/L (ref 3.5–5.1)
Sodium: 140 mEq/L (ref 136–145)
TOTAL PROTEIN: 8.2 g/dL (ref 6.4–8.3)

## 2014-07-17 MED ORDER — SODIUM CHLORIDE 0.9 % IJ SOLN
10.0000 mL | INTRAMUSCULAR | Status: DC | PRN
  Administered 2014-07-17: 10 mL via INTRAVENOUS
  Filled 2014-07-17: qty 10

## 2014-07-17 MED ORDER — IOHEXOL 300 MG/ML  SOLN
100.0000 mL | Freq: Once | INTRAMUSCULAR | Status: AC | PRN
Start: 2014-07-17 — End: 2014-07-17
  Administered 2014-07-17: 100 mL via INTRAVENOUS

## 2014-07-17 MED ORDER — HEPARIN SOD (PORK) LOCK FLUSH 100 UNIT/ML IV SOLN
500.0000 [IU] | Freq: Once | INTRAVENOUS | Status: AC
  Administered 2014-07-17: 500 [IU] via INTRAVENOUS
  Filled 2014-07-17: qty 5

## 2014-07-17 NOTE — Patient Instructions (Signed)

## 2014-07-19 ENCOUNTER — Ambulatory Visit (HOSPITAL_BASED_OUTPATIENT_CLINIC_OR_DEPARTMENT_OTHER): Payer: PPO | Admitting: Oncology

## 2014-07-19 ENCOUNTER — Telehealth: Payer: Self-pay | Admitting: Oncology

## 2014-07-19 VITALS — BP 134/70 | HR 72 | Temp 98.0°F | Resp 18 | Ht 71.0 in | Wt 198.6 lb

## 2014-07-19 DIAGNOSIS — C679 Malignant neoplasm of bladder, unspecified: Secondary | ICD-10-CM

## 2014-07-19 MED ORDER — FOLIC ACID 1 MG PO TABS: 1.0000 mg | ORAL_TABLET | Freq: Every day | ORAL | Status: AC

## 2014-07-19 MED ORDER — HYDROCODONE-ACETAMINOPHEN 5-325 MG PO TABS: 2.0000 | ORAL_TABLET | ORAL | Status: DC | PRN

## 2014-07-19 NOTE — Progress Notes (Signed)
Hematology and Oncology Follow Up Visit  Joe Park [id number] 1941-06-28 74 y.o. 07/19/2014 2:54 PM   Principle Diagnosis: 74 year old man diagnosed with bladder cancer in 09/2011. Now he has recurrent advanced disease.  Prior Therapy: He is S/P cystoprostatectomy and bilateral pelvic lymph node dissection and creation of an ileal conduit done on 11/11/2011. 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 The pathological stage was T3b N0.  Secondary Diagnosis: Prostate cancer diagnosed in 2002. Not active at this time.   Current therapy: Systemic chemotherapy with carboplatin for an AUC of 5 given on day 1 and gemcitabine at 1000 mg per meter square given on days 1 and 8 every 3 weeks. He is S/P 6 cycles of therapy concluded on 03/09/2014. He is currently on a treatment break.  Interim History:  Joe Park returns today for a follow up visit. Since the last visit, he continues to do relatively well. He does report occasional pelvic discomfort especially when he is sitting down or laying down. He does take hydrocodone which have helped his symptoms. He is able to ambulate without any difficulty. He is not reporting any neurological deficits or weakness. He continues to have reasonable quality of life and continues to live independently. His appetite has been excellent.He dnies any fever chills, no nausea vomiting or night sweats. Denies cough or hemoptysis or hematemesis. Denies abdominal pain no hematochezia or melena. Has not reported any genitourinary complaints. He declined headaches or blurry vision or double vision. Did not report any syncope or seizures. Did not report any chest pain shortness of breath or cough or hemoptysis. Did not report any hematochezia or melena. Does not report any urinary symptoms. Does not report any musculoskeletal complaints. Did not report any  arthralgias myalgias or rash or pruritus. Rest or view of system is unremarkable.   Medications: I have reviewed the patient's current medications. Current Outpatient Prescriptions  Medication Sig Dispense Refill  . aspirin 81 MG chewable tablet Chew 81 mg by mouth daily with breakfast.     . Chlorhexidine Gluconate (PERIOGARD MT) Use as directed 0.5 oz in the mouth or throat at bedtime.     Marland Kitchen HYDROcodone-acetaminophen (NORCO/VICODIN) 5-325 MG per tablet Take 2 tablets by mouth every 4 (four) hours as needed. 24 tablet 0  . levothyroxine (SYNTHROID, LEVOTHROID) 125 MCG tablet Take 125 mcg by mouth daily with breakfast.     . lidocaine-prilocaine (EMLA) cream Apply 1 application topically as needed. Apply approx 1/2 tsp to skin, over port-a-cath prior to chemotherapy treatments. 30 g 2  . lisinopril (PRINIVIL,ZESTRIL) 20 MG tablet Take 20 mg by mouth daily.    Marland Kitchen lisinopril (PRINIVIL,ZESTRIL) 40 MG tablet Take 40 mg by mouth every morning.    . Multiple Vitamin (MULITIVITAMIN WITH MINERALS) TABS Take 1 tablet by mouth every morning.    . naproxen sodium (ANAPROX) 220 MG tablet Take 660 mg by mouth 2 (two) times daily as needed (for pain.).    Marland Kitchen prochlorperazine (COMPAZINE) 10 MG tablet Take 1 tablet (10 mg total) by mouth every 6 (six) hours as needed for nausea or vomiting. 60 tablet 1  . simvastatin (ZOCOR) 40 MG tablet Take 40 mg by mouth at bedtime.    . folic acid (FOLVITE) 1 MG tablet Take 1 tablet (1 mg total) by mouth daily. 60 tablet 3  . [DISCONTINUED] hydrALAZINE (APRESOLINE) 25 MG tablet Take 12.5 mg by  mouth every morning.     No current facility-administered medications for this visit.    Allergies: No Known Allergies  Past Medical History, Surgical history, Social history, and Family History were reviewed and updated.    Physical Exam: Blood pressure 134/70, pulse 72, temperature 98 F (36.7 C), temperature source Oral, resp. rate 18, height 5\' 11"  (1.803 m), weight 198 lb  9.6 oz (90.084 kg), SpO2 100 %. ECOG: 1 General appearance: alert awake not in any distress. Head: Atraumatic no abnormalities Neck: no adenopathy, no masses palpated. Lymph nodes: Cervical, supraclavicular, and axillary nodes normal. Heart:regular rate and rhythm, S1, S2. No murmurs rubs or gallops. Lung:chest clear, no wheezing, rales, normal symmetric air entry no dullness to percussion. Abdomen: soft, non-tender, without masses or organomegaly good bowel sounds noted. EXT:no erythema, induration, or nodules Neurological examination: No deficits at this time noted.   Lab Results: Lab Results  Component Value Date   WBC 6.0 07/17/2014   HGB 10.6* 07/17/2014   HCT 32.9* 07/17/2014   MCV 86.2 07/17/2014   PLT 246 07/17/2014     Chemistry      Component Value Date/Time   NA 140 07/17/2014 1312   NA 140 02/24/2012 1249   K 3.9 07/17/2014 1312   K 3.9 02/24/2012 1249   CL 106 12/27/2012 0801   CL 110 02/24/2012 1249   CO2 25 07/17/2014 1312   CO2 23 02/24/2012 1249   BUN 19.1 07/17/2014 1312   BUN 18 02/24/2012 1249   CREATININE 1.0 07/17/2014 1312   CREATININE 0.98 02/24/2012 1249      Component Value Date/Time   CALCIUM 10.0 07/17/2014 1312   CALCIUM 8.9 02/24/2012 1249   ALKPHOS 108 07/17/2014 1312   ALKPHOS 85 02/24/2012 1249   AST 24 07/17/2014 1312   AST 18 02/24/2012 1249   ALT 14 07/17/2014 1312   ALT 12 02/24/2012 1249   BILITOT 0.54 07/17/2014 1312   BILITOT 0.6 02/24/2012 1249     EXAM: CT CHEST, ABDOMEN, AND PELVIS WITH CONTRAST  TECHNIQUE: Multidetector CT imaging of the chest, abdomen and pelvis was performed following the standard protocol during bolus administration of intravenous contrast.  CONTRAST: 125mL OMNIPAQUE IOHEXOL 300 MG/ML SOLN  COMPARISON: 03/21/2014  FINDINGS: CT CHEST FINDINGS  Mediastinum/Lymph Nodes: No masses or pathologically enlarged lymph nodes identified.  Lungs/Pleura: No pulmonary infiltrate or mass  identified. No effusion present.  Musculoskeletal/Soft Tissues: No suspicious bone lesions identified or significant chest wall abnormality.  Upper Abdomen: Unremarkable.  CT ABDOMEN AND PELVIS FINDINGS  Hepatobiliary: No masses or other significant abnormality identified.  Pancreas: No mass, inflammatory changes, or other parenchymal abnormality identified.  Spleen: Within normal limits in size and appearance.  Adrenal Glands: No mass identified.  Kidneys/Urinary Tract: No masses identified. Stable tiny right renal cysts. No evidence of hydronephrosis.  Previous cystectomy and ileal loop diversion again seen with small parastomal hernia containing only fat.  Stomach/Bowel/Peritoneum: No evidence of wall thickening, mass, or obstruction.  Vascular/Lymphatic: No pathologically enlarged lymph nodes identified. No other significant abnormality identified.  Reproductive: Brachytherapy seeds again seen in the prostate bed. A soft tissue mass is seen in the cystectomy bed in the central pelvis which is new since previous study. This measures 3.9 x 3.3 cm on image 119 of series 2, consistent with carcinoma.  Other: Increased size of a large pelvic or soft tissue masses seen involving the pubic symphysis and base of the penis. This measures 10.5 x 5.7 cm on image 131/series 2  compared to 3.9 x 6.9 cm previously. There is increased associated osteolysis involving the pubic bones bilaterally.  Musculoskeletal: Benign appearing fractures are again seen involving the pubic rami bilaterally.  IMPRESSION: New 3.9 cm soft tissue mass in the central pelvis cystectomy surgical bed, consistent with carcinoma.  Increased large soft tissue mass in the pelvic floor involving the base of penis and bilateral pubic bones, consistent with progressive carcinoma.  Previous benign appearing bilateral pubic rami fractures again noted.  No evidence of metastatic  disease within the abdomen or chest.  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 He developed pelvic recurrence and he is S/P 6 cycles of chemotherapy and have tolerated it well. CT scan on 07/17/2014 was discussed with the patient today and showed clear progression of disease. Treatment options were discussed today including salvage chemotherapy versus observation which I advised against. He is willing to proceed with chemotherapy. Different agents were discussed today including Taxotere chemotherapy versus Alimta. Risks and benefits of both medications were discussed and we decided to proceed with Alimta. Complications include nausea, vomiting, myelosuppression, anemia, vitamin deficiency as well as skin rash were discussed and he is willing to proceed.   2. Pelvic pain: Seems to be manageable with hydrocodone and I have refilled his medication today.  3. Anti-emetics: He has a prescription for antibiotics at this time.  4. Anemia: His hemoglobin appear adequate at this time.  5. IV access: Port-A-Cath in place and will be used for systemic chemotherapy.  6. Vitamin supplementation: He was started on folic acid today 1 mg daily and we will initiate vitamin B12 injections with the start of Alimta and every third cycle.  7. Followup: On 08/02/2014 to the start of the first cycle of Alimta. He will also follow-up every 3 weeks for clinical evaluation for his next cycle of chemotherapy.  VOHYWV,PXTGG  1/7/20162:54 PM

## 2014-07-19 NOTE — Telephone Encounter (Signed)
gv and printed appt sched and avs for pt fro Jan thru March 2016

## 2014-08-02 ENCOUNTER — Ambulatory Visit (HOSPITAL_BASED_OUTPATIENT_CLINIC_OR_DEPARTMENT_OTHER): Payer: PPO

## 2014-08-02 ENCOUNTER — Other Ambulatory Visit: Payer: Self-pay | Admitting: Oncology

## 2014-08-02 ENCOUNTER — Other Ambulatory Visit (HOSPITAL_BASED_OUTPATIENT_CLINIC_OR_DEPARTMENT_OTHER): Payer: PPO

## 2014-08-02 ENCOUNTER — Ambulatory Visit: Payer: PPO

## 2014-08-02 DIAGNOSIS — C679 Malignant neoplasm of bladder, unspecified: Secondary | ICD-10-CM

## 2014-08-02 DIAGNOSIS — Z5111 Encounter for antineoplastic chemotherapy: Secondary | ICD-10-CM

## 2014-08-02 DIAGNOSIS — Z95828 Presence of other vascular implants and grafts: Secondary | ICD-10-CM

## 2014-08-02 DIAGNOSIS — C7989 Secondary malignant neoplasm of other specified sites: Secondary | ICD-10-CM

## 2014-08-02 LAB — COMPREHENSIVE METABOLIC PANEL (CC13)
ALK PHOS: 106 U/L (ref 40–150)
ALT: 17 U/L (ref 0–55)
ANION GAP: 9 meq/L (ref 3–11)
AST: 21 U/L (ref 5–34)
Albumin: 3.2 g/dL — ABNORMAL LOW (ref 3.5–5.0)
BILIRUBIN TOTAL: 0.67 mg/dL (ref 0.20–1.20)
BUN: 20.1 mg/dL (ref 7.0–26.0)
CALCIUM: 9.6 mg/dL (ref 8.4–10.4)
CO2: 25 mEq/L (ref 22–29)
CREATININE: 1 mg/dL (ref 0.7–1.3)
Chloride: 105 mEq/L (ref 98–109)
EGFR: 74 mL/min/{1.73_m2} — ABNORMAL LOW (ref >90)
Glucose: 101 mg/dl (ref 70–140)
Potassium: 3.7 mEq/L (ref 3.5–5.1)
SODIUM: 139 meq/L (ref 136–145)
TOTAL PROTEIN: 7.6 g/dL (ref 6.4–8.3)

## 2014-08-02 LAB — CBC WITH DIFFERENTIAL/PLATELET
BASO%: 0.6 % (ref 0.0–2.0)
Basophils Absolute: 0 10*3/uL (ref 0.0–0.1)
EOS%: 0.4 % (ref 0.0–7.0)
Eosinophils Absolute: 0 10*3/uL (ref 0.0–0.5)
HEMATOCRIT: 31.5 % — AB (ref 38.4–49.9)
HGB: 10.1 g/dL — ABNORMAL LOW (ref 13.0–17.1)
LYMPH#: 1.2 10*3/uL (ref 0.9–3.3)
LYMPH%: 17.7 % (ref 14.0–49.0)
MCH: 27.2 pg (ref 27.2–33.4)
MCHC: 32 g/dL (ref 32.0–36.0)
MCV: 84.9 fL (ref 79.3–98.0)
MONO#: 0.8 10*3/uL (ref 0.1–0.9)
MONO%: 12.2 % (ref 0.0–14.0)
NEUT%: 69.1 % (ref 39.0–75.0)
NEUTROS ABS: 4.7 10*3/uL (ref 1.5–6.5)
Platelets: 231 10*3/uL (ref 140–400)
RBC: 3.71 10*6/uL — AB (ref 4.20–5.82)
RDW: 16.3 % — AB (ref 11.0–14.6)
WBC: 6.8 10*3/uL (ref 4.0–10.3)

## 2014-08-02 MED ORDER — SODIUM CHLORIDE 0.9 % IJ SOLN
10.0000 mL | INTRAMUSCULAR | Status: DC | PRN
  Administered 2014-08-02: 10 mL
  Filled 2014-08-02: qty 10

## 2014-08-02 MED ORDER — HEPARIN SOD (PORK) LOCK FLUSH 100 UNIT/ML IV SOLN
500.0000 [IU] | Freq: Once | INTRAVENOUS | Status: AC
  Administered 2014-08-02: 500 [IU] via INTRAVENOUS
  Filled 2014-08-02: qty 5

## 2014-08-02 MED ORDER — ONDANSETRON 8 MG/50ML IVPB (CHCC)
8.0000 mg | Freq: Once | INTRAVENOUS | Status: AC
  Administered 2014-08-02: 8 mg via INTRAVENOUS

## 2014-08-02 MED ORDER — CYANOCOBALAMIN 1000 MCG/ML IJ SOLN
1000.0000 ug | Freq: Once | INTRAMUSCULAR | Status: AC
  Administered 2014-08-02: 1000 ug via INTRAMUSCULAR

## 2014-08-02 MED ORDER — ONDANSETRON 8 MG/NS 50 ML IVPB
INTRAVENOUS | Status: AC
  Filled 2014-08-02: qty 8

## 2014-08-02 MED ORDER — SODIUM CHLORIDE 0.9 % IJ SOLN
10.0000 mL | INTRAMUSCULAR | Status: DC | PRN
  Administered 2014-08-02: 10 mL via INTRAVENOUS
  Filled 2014-08-02: qty 10

## 2014-08-02 MED ORDER — CYANOCOBALAMIN 1000 MCG/ML IJ SOLN
INTRAMUSCULAR | Status: AC
  Filled 2014-08-02: qty 1

## 2014-08-02 MED ORDER — SODIUM CHLORIDE 0.9 % IV SOLN
Freq: Once | INTRAVENOUS | Status: AC
  Administered 2014-08-02: 12:00:00 via INTRAVENOUS

## 2014-08-02 MED ORDER — DEXAMETHASONE SODIUM PHOSPHATE 10 MG/ML IJ SOLN
10.0000 mg | Freq: Once | INTRAMUSCULAR | Status: AC
Start: 2014-08-02 — End: 2014-08-02
  Administered 2014-08-02: 10 mg via INTRAVENOUS

## 2014-08-02 MED ORDER — HEPARIN SOD (PORK) LOCK FLUSH 100 UNIT/ML IV SOLN
500.0000 [IU] | Freq: Once | INTRAVENOUS | Status: AC | PRN
  Administered 2014-08-02: 500 [IU]
  Filled 2014-08-02: qty 5

## 2014-08-02 MED ORDER — SODIUM CHLORIDE 0.9 % IV SOLN
1000.0000 mg | Freq: Once | INTRAVENOUS | Status: AC
  Administered 2014-08-02: 1000 mg via INTRAVENOUS
  Filled 2014-08-02: qty 40

## 2014-08-02 MED ORDER — DEXAMETHASONE SODIUM PHOSPHATE 10 MG/ML IJ SOLN
INTRAMUSCULAR | Status: AC
  Filled 2014-08-02: qty 1

## 2014-08-02 NOTE — Patient Instructions (Signed)
[facility] Discharge Instructions for Patients Receiving Chemotherapy  Today you received the following chemotherapy agents alimta  To help prevent nausea and vomiting after your treatment, we encourage you to take your nausea medication as directed   If you develop nausea and vomiting that is not controlled by your nausea medication, call the clinic.   BELOW ARE SYMPTOMS THAT SHOULD BE REPORTED IMMEDIATELY:  *FEVER GREATER THAN 100.5 F  *CHILLS WITH OR WITHOUT FEVER  NAUSEA AND VOMITING THAT IS NOT CONTROLLED WITH YOUR NAUSEA MEDICATION  *UNUSUAL SHORTNESS OF BREATH  *UNUSUAL BRUISING OR BLEEDING  TENDERNESS IN MOUTH AND THROAT WITH OR WITHOUT PRESENCE OF ULCERS  *URINARY PROBLEMS  *BOWEL PROBLEMS  UNUSUAL RASH Items with * indicate a potential emergency and should be followed up as soon as possible.  Feel free to call the clinic you have any questions or concerns. The clinic phone number is [phone].  Pemetrexed injection What is this medicine? PEMETREXED (PEM e TREX ed) is a chemotherapy drug. This medicine affects cells that are rapidly growing, such as cancer cells and cells in your mouth and stomach. It is usually used to treat lung cancers like non-small cell lung cancer and mesothelioma. It may also be used to treat other cancers. This medicine may be used for other purposes; ask your health care provider or pharmacist if you have questions. COMMON BRAND NAME(S): Alimta What should I tell my health care provider before I take this medicine? They need to know if you have any of these conditions: -if you frequently drink alcohol containing beverages -infection (especially a virus infection such as chickenpox, cold sores, or herpes) -kidney disease -liver disease -low blood counts, like low platelets, red bloods, or white blood cells -an unusual or allergic reaction to pemetrexed, mannitol, other medicines, foods, dyes, or preservatives -pregnant  or trying to get pregnant -breast-feeding How should I use this medicine? This drug is given as an infusion into a vein. It is administered in a hospital or clinic by a specially trained health care professional. Talk to your pediatrician regarding the use of this medicine in children. Special care may be needed. Overdosage: If you think you have taken too much of this medicine contact a poison control center or emergency room at once. NOTE: This medicine is only for you. Do not share this medicine with others. What if I miss a dose? It is important not to miss your dose. Call your doctor or health care professional if you are unable to keep an appointment. What may interact with this medicine? -aspirin and aspirin-like medicines -medicines to increase blood counts like filgrastim, pegfilgrastim, sargramostim -methotrexate -NSAIDS, medicines for pain and inflammation, like ibuprofen or naproxen -probenecid -pyrimethamine -vaccines Talk to your doctor or health care professional before taking any of these medicines: -acetaminophen -aspirin -ibuprofen -ketoprofen -naproxen This list may not describe all possible interactions. Give your health care provider a list of all the medicines, herbs, non-prescription drugs, or dietary supplements you use. Also tell them if you smoke, drink alcohol, or use illegal drugs. Some items may interact with your medicine. What should I watch for while using this medicine? Visit your doctor for checks on your progress. This drug may make you feel generally unwell. This is not uncommon, as chemotherapy can affect healthy cells as well as cancer cells. Report any side effects. Continue your course of treatment even though you feel ill unless your doctor tells you to stop. In some cases, you may be  given additional medicines to help with side effects. Follow all directions for their use. Call your doctor or health care professional for advice if you get a fever,  chills or sore throat, or other symptoms of a cold or flu. Do not treat yourself. This drug decreases your body's ability to fight infections. Try to avoid being around people who are sick. This medicine may increase your risk to bruise or bleed. Call your doctor or health care professional if you notice any unusual bleeding. Be careful brushing and flossing your teeth or using a toothpick because you may get an infection or bleed more easily. If you have any dental work done, tell your dentist you are receiving this medicine. Avoid taking products that contain aspirin, acetaminophen, ibuprofen, naproxen, or ketoprofen unless instructed by your doctor. These medicines may hide a fever. Call your doctor or health care professional if you get diarrhea or mouth sores. Do not treat yourself. To protect your kidneys, drink water or other fluids as directed while you are taking this medicine. Men and women must use effective birth control while taking this medicine. You may also need to continue using effective birth control for a time after stopping this medicine. Do not become pregnant while taking this medicine. Tell your doctor right away if you think that you or your partner might be pregnant. There is a potential for serious side effects to an unborn child. Talk to your health care professional or pharmacist for more information. Do not breast-feed an infant while taking this medicine. This medicine may lower sperm counts. What side effects may I notice from receiving this medicine? Side effects that you should report to your doctor or health care professional as soon as possible: -allergic reactions like skin rash, itching or hives, swelling of the face, lips, or tongue -low blood counts - this medicine may decrease the number of white blood cells, red blood cells and platelets. You may be at increased risk for infections and bleeding. -signs of infection - fever or chills, cough, sore throat, pain or  difficulty passing urine -signs of decreased platelets or bleeding - bruising, pinpoint red spots on the skin, black, tarry stools, blood in the urine -signs of decreased red blood cells - unusually weak or tired, fainting spells, lightheadedness -breathing problems, like a dry cough -changes in emotions or moods -chest pain -confusion -diarrhea -high blood pressure -mouth or throat sores or ulcers -pain, swelling, warmth in the leg -pain on swallowing -swelling of the ankles, feet, hands -trouble passing urine or change in the amount of urine -vomiting -yellowing of the eyes or skin Side effects that usually do not require medical attention (report to your doctor or health care professional if they continue or are bothersome): -hair loss -loss of appetite -nausea -stomach upset This list may not describe all possible side effects. Call your doctor for medical advice about side effects. You may report side effects to FDA at 1-800-FDA-1088. Where should I keep my medicine? This drug is given in a hospital or clinic and will not be stored at home. NOTE: This sheet is a summary. It may not cover all possible information. If you have questions about this medicine, talk to your doctor, pharmacist, or health care provider.  2015, [employer]vier/[employer]. (2008-01-31 13:24:03)

## 2014-08-08 ENCOUNTER — Encounter: Payer: Self-pay | Admitting: *Deleted

## 2014-08-08 NOTE — Progress Notes (Signed)
[facility]  Clinical Social Work received call from patient's sister/POA that patient recently lost 35 of income from [place] and she is "worried for how he'll pay his cancer bills".  She stated he doesn't currently have bills, but will be starting chemotherapy again soon.  CSW gave patient financial advocate information to determine if he qualifies for copay assistance programs.  CSW will make referral to financial advocates to see patient at upcoming appointment.  CSW will follow up with patient's sister at a later time.  Polo Riley, MSW, LCSW, OSW-C Clinical Social Worker [facility] [phone]

## 2014-08-10 ENCOUNTER — Encounter: Payer: Self-pay | Admitting: Oncology

## 2014-08-10 NOTE — Progress Notes (Signed)
Spoke w/ pt's sister in law regarding financial assistance.  I informed her of the $32 [place] that pt can apply for that will assist with medication and other personal bills.  I also made her aware that I can reach out to different foundations that offer copay assistance for Alimta if needed.  I suggested we wait to see how much his insurance will pay first.  She agreed.  She stated that right now he's fine but she knows later on down the road he will need assistance and will apply for the grant at that time.  I assured her that it will be available whenever they're ready.  She has my number and will contact me in the near future.

## 2014-08-23 ENCOUNTER — Ambulatory Visit: Payer: PPO | Admitting: Physician Assistant

## 2014-08-23 ENCOUNTER — Other Ambulatory Visit: Payer: PPO

## 2014-08-23 ENCOUNTER — Ambulatory Visit: Payer: PPO

## 2014-08-24 ENCOUNTER — Ambulatory Visit: Payer: PPO

## 2014-08-24 ENCOUNTER — Ambulatory Visit (HOSPITAL_BASED_OUTPATIENT_CLINIC_OR_DEPARTMENT_OTHER): Payer: PPO

## 2014-08-24 ENCOUNTER — Ambulatory Visit (HOSPITAL_BASED_OUTPATIENT_CLINIC_OR_DEPARTMENT_OTHER): Payer: PPO | Admitting: Physician Assistant

## 2014-08-24 ENCOUNTER — Encounter: Payer: Self-pay | Admitting: Physician Assistant

## 2014-08-24 ENCOUNTER — Other Ambulatory Visit (HOSPITAL_BASED_OUTPATIENT_CLINIC_OR_DEPARTMENT_OTHER): Payer: PPO

## 2014-08-24 ENCOUNTER — Other Ambulatory Visit: Payer: Self-pay | Admitting: Oncology

## 2014-08-24 VITALS — BP 137/67 | HR 69 | Temp 97.7°F | Resp 20 | Ht 71.0 in | Wt 194.9 lb

## 2014-08-24 DIAGNOSIS — C7989 Secondary malignant neoplasm of other specified sites: Secondary | ICD-10-CM

## 2014-08-24 DIAGNOSIS — C679 Malignant neoplasm of bladder, unspecified: Secondary | ICD-10-CM

## 2014-08-24 DIAGNOSIS — Z5111 Encounter for antineoplastic chemotherapy: Secondary | ICD-10-CM

## 2014-08-24 DIAGNOSIS — R102 Pelvic and perineal pain: Secondary | ICD-10-CM

## 2014-08-24 LAB — CBC WITH DIFFERENTIAL/PLATELET
BASO%: 0.9 % (ref 0.0–2.0)
Basophils Absolute: 0 10*3/uL (ref 0.0–0.1)
EOS ABS: 0 10*3/uL (ref 0.0–0.5)
EOS%: 0.8 % (ref 0.0–7.0)
HCT: 29 % — ABNORMAL LOW (ref 38.4–49.9)
HGB: 9.3 g/dL — ABNORMAL LOW (ref 13.0–17.1)
LYMPH#: 1.1 10*3/uL (ref 0.9–3.3)
LYMPH%: 22.5 % (ref 14.0–49.0)
MCH: 27 pg — ABNORMAL LOW (ref 27.2–33.4)
MCHC: 32 g/dL (ref 32.0–36.0)
MCV: 84.2 fL (ref 79.3–98.0)
MONO#: 0.7 10*3/uL (ref 0.1–0.9)
MONO%: 14 % (ref 0.0–14.0)
NEUT%: 61.8 % (ref 39.0–75.0)
NEUTROS ABS: 3 10*3/uL (ref 1.5–6.5)
Platelets: 263 10*3/uL (ref 140–400)
RBC: 3.45 10*6/uL — AB (ref 4.20–5.82)
RDW: 17.7 % — ABNORMAL HIGH (ref 11.0–14.6)
WBC: 4.9 10*3/uL (ref 4.0–10.3)

## 2014-08-24 LAB — COMPREHENSIVE METABOLIC PANEL (CC13)
ALT: 16 U/L (ref 0–55)
AST: 21 U/L (ref 5–34)
Albumin: 3.2 g/dL — ABNORMAL LOW (ref 3.5–5.0)
Alkaline Phosphatase: 106 U/L (ref 40–150)
Anion Gap: 7 mEq/L (ref 3–11)
BUN: 16.2 mg/dL (ref 7.0–26.0)
CALCIUM: 9.4 mg/dL (ref 8.4–10.4)
CHLORIDE: 107 meq/L (ref 98–109)
CO2: 22 meq/L (ref 22–29)
Creatinine: 1.1 mg/dL (ref 0.7–1.3)
EGFR: 66 mL/min/{1.73_m2} — ABNORMAL LOW (ref >90)
GLUCOSE: 149 mg/dL — AB (ref 70–140)
Potassium: 3.3 mEq/L — ABNORMAL LOW (ref 3.5–5.1)
SODIUM: 136 meq/L (ref 136–145)
Total Bilirubin: 0.56 mg/dL (ref 0.20–1.20)
Total Protein: 7.4 g/dL (ref 6.4–8.3)

## 2014-08-24 MED ORDER — SODIUM CHLORIDE 0.9 % IV SOLN
Freq: Once | INTRAVENOUS | Status: AC
  Administered 2014-08-24: 13:00:00 via INTRAVENOUS

## 2014-08-24 MED ORDER — DEXAMETHASONE SODIUM PHOSPHATE 10 MG/ML IJ SOLN
INTRAMUSCULAR | Status: AC
  Filled 2014-08-24: qty 1

## 2014-08-24 MED ORDER — SODIUM CHLORIDE 0.9 % IJ SOLN
10.0000 mL | INTRAMUSCULAR | Status: DC | PRN
  Administered 2014-08-24: 10 mL
  Filled 2014-08-24: qty 10

## 2014-08-24 MED ORDER — DEXAMETHASONE SODIUM PHOSPHATE 10 MG/ML IJ SOLN
10.0000 mg | Freq: Once | INTRAMUSCULAR | Status: AC
  Administered 2014-08-24: 10 mg via INTRAVENOUS

## 2014-08-24 MED ORDER — HYDROCODONE-ACETAMINOPHEN 5-325 MG PO TABS: 2.0000 | ORAL_TABLET | ORAL | Status: DC | PRN

## 2014-08-24 MED ORDER — ONDANSETRON 8 MG/NS 50 ML IVPB
INTRAVENOUS | Status: AC
  Filled 2014-08-24: qty 8

## 2014-08-24 MED ORDER — ONDANSETRON 8 MG/50ML IVPB (CHCC)
8.0000 mg | Freq: Once | INTRAVENOUS | Status: AC
  Administered 2014-08-24: 8 mg via INTRAVENOUS

## 2014-08-24 MED ORDER — SODIUM CHLORIDE 0.9 % IV SOLN
1000.0000 mg | Freq: Once | INTRAVENOUS | Status: AC
  Administered 2014-08-24: 1000 mg via INTRAVENOUS
  Filled 2014-08-24: qty 40

## 2014-08-24 MED ORDER — HEPARIN SOD (PORK) LOCK FLUSH 100 UNIT/ML IV SOLN
500.0000 [IU] | Freq: Once | INTRAVENOUS | Status: AC | PRN
  Administered 2014-08-24: 500 [IU]
  Filled 2014-08-24: qty 5

## 2014-08-24 NOTE — Progress Notes (Signed)
Hematology and Oncology Follow Up Visit  Joe Park [id number] 23-Jun-1941 74 y.o. 08/24/2014 1:58 PM   Principle Diagnosis: 74 year old man diagnosed with bladder cancer in 09/2011. Now he has recurrent advanced disease.  Prior Therapy:  1. He is S/P cystoprostatectomy and bilateral pelvic lymph node dissection and creation of an ileal conduit done on 11/11/2011. 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 The pathological stage was T3b N0.  2. Systemic chemotherapy with carboplatin for an AUC of 5 given on day 1 and gemcitabine at 1000 mg per meter square given on days 1 and 8 every 3 weeks. He is S/P 6 cycles of therapy concluded on 03/09/2014.  Secondary Diagnosis: Prostate cancer diagnosed in 2002. Not active at this time.   Current therapy:  Systemic chemotherapy with Alimta at 500 mg/m given every 3 weeks. Status post 1 cycle  Interim History:  Mr. Speciale returns today for a follow up visit. Since the last visit, he continues to do relatively well. He tolerated his first cycle of systemic chemotherapy with Alimta without difficulty. He does report occasional pelvic discomfort especially when he is sitting down or laying down. He does take hydrocodone which have helped his symptoms. He requests a refill for his hydrocodone. He is able to ambulate without any difficulty. He is not reporting any neurological deficits or weakness. He continues to have reasonable quality of life and continues to live independently. His appetite has been excellent.He dnies any fever chills, no nausea vomiting or night sweats. Denies cough or hemoptysis or hematemesis. Denies abdominal pain no hematochezia or melena. Has not reported any genitourinary complaints. He declined headaches or blurry vision or double vision. Did not report any syncope or seizures. Did not report any chest pain shortness  of breath or cough or hemoptysis. Did not report any hematochezia or melena. Does not report any urinary symptoms. Does not report any other musculoskeletal complaints. Did not report any arthralgias myalgias or rash or pruritus. Remainder or view of system is unremarkable.   Medications: I have reviewed the patient's current medications. Current Outpatient Prescriptions  Medication Sig Dispense Refill  . aspirin 81 MG chewable tablet Chew 81 mg by mouth daily with breakfast.     . Chlorhexidine Gluconate (PERIOGARD MT) Use as directed 0.5 oz in the mouth or throat at bedtime.     . folic acid (FOLVITE) 1 MG tablet Take 1 tablet (1 mg total) by mouth daily. 60 tablet 3  . HYDROcodone-acetaminophen (NORCO/VICODIN) 5-325 MG per tablet Take 2 tablets by mouth every 4 (four) hours as needed. 24 tablet 0  . levothyroxine (SYNTHROID, LEVOTHROID) 125 MCG tablet Take 125 mcg by mouth daily with breakfast.     . lidocaine-prilocaine (EMLA) cream Apply 1 application topically as needed. Apply approx 1/2 tsp to skin, over port-a-cath prior to chemotherapy treatments. 30 g 2  . lisinopril (PRINIVIL,ZESTRIL) 20 MG tablet Take 20 mg by mouth daily.    . Multiple Vitamin (MULITIVITAMIN WITH MINERALS) TABS Take 1 tablet by mouth every morning.    . naproxen sodium (ANAPROX) 220 MG tablet Take 660 mg by mouth 2 (two) times daily as needed (for pain.).    Marland Kitchen prochlorperazine (COMPAZINE) 10 MG tablet Take 1 tablet (10 mg total) by mouth every 6 (six) hours as needed for nausea or vomiting. 60 tablet 1  . simvastatin (ZOCOR) 40 MG tablet Take 40 mg by  mouth at bedtime.    . [DISCONTINUED] hydrALAZINE (APRESOLINE) 25 MG tablet Take 12.5 mg by mouth every morning.     No current facility-administered medications for this visit.   Facility-Administered Medications Ordered in Other Visits  Medication Dose Route Frequency Provider Last Rate Last Dose  . sodium chloride 0.9 % injection 10 mL  10 mL Intracatheter PRN  Wyatt Portela, MD   10 mL at 08/24/14 1331    Allergies: No Known Allergies  Past Medical History, Surgical history, Social history, and Family History were reviewed and updated.    Physical Exam: Blood pressure 137/67, pulse 69, temperature 97.7 F (36.5 C), temperature source Oral, resp. rate 20, height 5\' 11"  (1.803 m), weight 194 lb 14.4 oz (88.406 kg), SpO2 100 %. ECOG: 1 General appearance: alert awake not in any distress. Head: Atraumatic no abnormalities Neck: no adenopathy, no masses palpated. Lymph nodes: Cervical, supraclavicular, and axillary nodes normal. Heart:regular rate and rhythm, S1, S2. No murmurs rubs or gallops. Lung:chest clear, no wheezing, rales, normal symmetric air entry no dullness to percussion. Abdomen: soft, non-tender, without masses or organomegaly good bowel sounds noted. EXT:no erythema, induration, or nodules Neurological examination: No deficits at this time noted.   Lab Results: Lab Results  Component Value Date   WBC 4.9 08/24/2014   HGB 9.3* 08/24/2014   HCT 29.0* 08/24/2014   MCV 84.2 08/24/2014   PLT 263 08/24/2014     Chemistry      Component Value Date/Time   NA 136 08/24/2014 1012   NA 140 02/24/2012 1249   K 3.3* 08/24/2014 1012   K 3.9 02/24/2012 1249   CL 106 12/27/2012 0801   CL 110 02/24/2012 1249   CO2 22 08/24/2014 1012   CO2 23 02/24/2012 1249   BUN 16.2 08/24/2014 1012   BUN 18 02/24/2012 1249   CREATININE 1.1 08/24/2014 1012   CREATININE 0.98 02/24/2012 1249      Component Value Date/Time   CALCIUM 9.4 08/24/2014 1012   CALCIUM 8.9 02/24/2012 1249   ALKPHOS 106 08/24/2014 1012   ALKPHOS 85 02/24/2012 1249   AST 21 08/24/2014 1012   AST 18 02/24/2012 1249   ALT 16 08/24/2014 1012   ALT 12 02/24/2012 1249   BILITOT 0.56 08/24/2014 1012   BILITOT 0.6 02/24/2012 1249     EXAM: CT CHEST, ABDOMEN, AND PELVIS WITH CONTRAST  TECHNIQUE: Multidetector CT imaging of the chest, abdomen and pelvis  was performed following the standard protocol during bolus administration of intravenous contrast.  CONTRAST: 115mL OMNIPAQUE IOHEXOL 300 MG/ML SOLN  COMPARISON: 03/21/2014  FINDINGS: CT CHEST FINDINGS  Mediastinum/Lymph Nodes: No masses or pathologically enlarged lymph nodes identified.  Lungs/Pleura: No pulmonary infiltrate or mass identified. No effusion present.  Musculoskeletal/Soft Tissues: No suspicious bone lesions identified or significant chest wall abnormality.  Upper Abdomen: Unremarkable.  CT ABDOMEN AND PELVIS FINDINGS  Hepatobiliary: No masses or other significant abnormality identified.  Pancreas: No mass, inflammatory changes, or other parenchymal abnormality identified.  Spleen: Within normal limits in size and appearance.  Adrenal Glands: No mass identified.  Kidneys/Urinary Tract: No masses identified. Stable tiny right renal cysts. No evidence of hydronephrosis.  Previous cystectomy and ileal loop diversion again seen with small parastomal hernia containing only fat.  Stomach/Bowel/Peritoneum: No evidence of wall thickening, mass, or obstruction.  Vascular/Lymphatic: No pathologically enlarged lymph nodes identified. No other significant abnormality identified.  Reproductive: Brachytherapy seeds again seen in the prostate bed. A soft tissue mass is seen in  the cystectomy bed in the central pelvis which is new since previous study. This measures 3.9 x 3.3 cm on image 119 of series 2, consistent with carcinoma.  Other: Increased size of a large pelvic or soft tissue masses seen involving the pubic symphysis and base of the penis. This measures 10.5 x 5.7 cm on image 131/series 2 compared to 3.9 x 6.9 cm previously. There is increased associated osteolysis involving the pubic bones bilaterally.  Musculoskeletal: Benign appearing fractures are again seen involving the pubic rami bilaterally.  IMPRESSION: New 3.9  cm soft tissue mass in the central pelvis cystectomy surgical bed, consistent with carcinoma.  Increased large soft tissue mass in the pelvic floor involving the base of penis and bilateral pubic bones, consistent with progressive carcinoma.  Previous benign appearing bilateral pubic rami fractures again noted.  No evidence of metastatic disease within the abdomen or chest.  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 He developed pelvic recurrence and he is S/P 6 cycles of chemotherapy and have tolerated it well. CT scan on 07/17/2014  showed clear progression of disease. He is now being treated with salvage chemotherapy with single agent Alimta at 500 mg/m every 3 weeks, status post 1 cycle.   2. Pelvic pain: Seems to be manageable with hydrocodone and I have refilled his medication today.  3. Anti-emetics: He has a prescription for antibiotics at this time.  4. Anemia: His hemoglobin appear adequate at this time.  5. IV access: Port-A-Cath in place and will be used for systemic chemotherapy.  6. Vitamin supplementation: He will continue on folic acid today 1 mg daily and we will continue vitamin B12 injections with every third cycle of Alimta.   7. Followup: On 09/14/2014 for clinical evaluation prior to the start of cycle #3. of chemotherapy.  [name] , PA-C 2/12/20161:58 PM

## 2014-08-24 NOTE — Patient Instructions (Signed)
Follow-up in 3 weeks prior to the start of your next scheduled cycle of chemotherapy

## 2014-08-24 NOTE — Patient Instructions (Signed)
[facility] Discharge Instructions for Patients Receiving Chemotherapy  Today you received the following chemotherapy agents Alimta  To help prevent nausea and vomiting after your treatment, we encourage you to take your nausea medication as prescribed.   If you develop nausea and vomiting that is not controlled by your nausea medication, call the clinic.   BELOW ARE SYMPTOMS THAT SHOULD BE REPORTED IMMEDIATELY:  *FEVER GREATER THAN 100.5 F  *CHILLS WITH OR WITHOUT FEVER  NAUSEA AND VOMITING THAT IS NOT CONTROLLED WITH YOUR NAUSEA MEDICATION  *UNUSUAL SHORTNESS OF BREATH  *UNUSUAL BRUISING OR BLEEDING  TENDERNESS IN MOUTH AND THROAT WITH OR WITHOUT PRESENCE OF ULCERS  *URINARY PROBLEMS  *BOWEL PROBLEMS  UNUSUAL RASH Items with * indicate a potential emergency and should be followed up as soon as possible.  Feel free to call the clinic you have any questions or concerns. The clinic phone number is [phone].    

## 2014-09-12 ENCOUNTER — Ambulatory Visit: Payer: PPO | Admitting: Oncology

## 2014-09-12 ENCOUNTER — Other Ambulatory Visit: Payer: PPO

## 2014-09-14 ENCOUNTER — Ambulatory Visit (HOSPITAL_BASED_OUTPATIENT_CLINIC_OR_DEPARTMENT_OTHER): Payer: PPO | Admitting: Oncology

## 2014-09-14 ENCOUNTER — Telehealth: Payer: Self-pay | Admitting: Oncology

## 2014-09-14 ENCOUNTER — Ambulatory Visit (HOSPITAL_BASED_OUTPATIENT_CLINIC_OR_DEPARTMENT_OTHER): Payer: PPO

## 2014-09-14 ENCOUNTER — Ambulatory Visit: Payer: PPO

## 2014-09-14 ENCOUNTER — Other Ambulatory Visit: Payer: Self-pay | Admitting: *Deleted

## 2014-09-14 ENCOUNTER — Other Ambulatory Visit (HOSPITAL_BASED_OUTPATIENT_CLINIC_OR_DEPARTMENT_OTHER): Payer: PPO

## 2014-09-14 VITALS — BP 140/70 | HR 92 | Temp 97.5°F | Resp 19 | Ht 71.0 in | Wt 196.1 lb

## 2014-09-14 VITALS — BP 130/65 | HR 90 | Temp 98.0°F

## 2014-09-14 DIAGNOSIS — R102 Pelvic and perineal pain: Secondary | ICD-10-CM

## 2014-09-14 DIAGNOSIS — C679 Malignant neoplasm of bladder, unspecified: Secondary | ICD-10-CM

## 2014-09-14 DIAGNOSIS — C7989 Secondary malignant neoplasm of other specified sites: Secondary | ICD-10-CM

## 2014-09-14 DIAGNOSIS — Z5111 Encounter for antineoplastic chemotherapy: Secondary | ICD-10-CM

## 2014-09-14 DIAGNOSIS — D649 Anemia, unspecified: Secondary | ICD-10-CM

## 2014-09-14 DIAGNOSIS — C670 Malignant neoplasm of trigone of bladder: Secondary | ICD-10-CM

## 2014-09-14 DIAGNOSIS — Z95828 Presence of other vascular implants and grafts: Secondary | ICD-10-CM

## 2014-09-14 LAB — COMPREHENSIVE METABOLIC PANEL (CC13)
ALK PHOS: 110 U/L (ref 40–150)
ALT: 18 U/L (ref 0–55)
ANION GAP: 13 meq/L — AB (ref 3–11)
AST: 28 U/L (ref 5–34)
Albumin: 3.1 g/dL — ABNORMAL LOW (ref 3.5–5.0)
BILIRUBIN TOTAL: 0.58 mg/dL (ref 0.20–1.20)
BUN: 18 mg/dL (ref 7.0–26.0)
CO2: 20 mEq/L — ABNORMAL LOW (ref 22–29)
CREATININE: 1.2 mg/dL (ref 0.7–1.3)
Calcium: 9.7 mg/dL (ref 8.4–10.4)
Chloride: 104 mEq/L (ref 98–109)
EGFR: 61 mL/min/{1.73_m2} — AB (ref >90)
GLUCOSE: 156 mg/dL — AB (ref 70–140)
Potassium: 3.6 mEq/L (ref 3.5–5.1)
Sodium: 138 mEq/L (ref 136–145)
Total Protein: 7.5 g/dL (ref 6.4–8.3)

## 2014-09-14 LAB — CBC WITH DIFFERENTIAL/PLATELET
BASO%: 0.2 % (ref 0.0–2.0)
BASOS ABS: 0 10*3/uL (ref 0.0–0.1)
EOS ABS: 0.1 10*3/uL (ref 0.0–0.5)
EOS%: 1 % (ref 0.0–7.0)
HEMATOCRIT: 28.8 % — AB (ref 38.4–49.9)
HEMOGLOBIN: 9.2 g/dL — AB (ref 13.0–17.1)
LYMPH%: 17.4 % (ref 14.0–49.0)
MCH: 27.4 pg (ref 27.2–33.4)
MCHC: 31.9 g/dL — ABNORMAL LOW (ref 32.0–36.0)
MCV: 85.8 fL (ref 79.3–98.0)
MONO#: 0.9 10*3/uL (ref 0.1–0.9)
MONO%: 16.4 % — AB (ref 0.0–14.0)
NEUT%: 65 % (ref 39.0–75.0)
NEUTROS ABS: 3.6 10*3/uL (ref 1.5–6.5)
Platelets: 272 10*3/uL (ref 140–400)
RBC: 3.35 10*6/uL — ABNORMAL LOW (ref 4.20–5.82)
RDW: 20 % — AB (ref 11.0–14.6)
WBC: 5.5 10*3/uL (ref 4.0–10.3)
lymph#: 1 10*3/uL (ref 0.9–3.3)

## 2014-09-14 LAB — TECHNOLOGIST REVIEW

## 2014-09-14 MED ORDER — ONDANSETRON 8 MG/50ML IVPB (CHCC)
8.0000 mg | Freq: Once | INTRAVENOUS | Status: AC
  Administered 2014-09-14: 8 mg via INTRAVENOUS

## 2014-09-14 MED ORDER — SODIUM CHLORIDE 0.9 % IJ SOLN
10.0000 mL | INTRAMUSCULAR | Status: DC | PRN
  Administered 2014-09-14: 10 mL via INTRAVENOUS
  Filled 2014-09-14: qty 10

## 2014-09-14 MED ORDER — HEPARIN SOD (PORK) LOCK FLUSH 100 UNIT/ML IV SOLN
500.0000 [IU] | Freq: Once | INTRAVENOUS | Status: AC
  Administered 2014-09-14: 500 [IU] via INTRAVENOUS
  Filled 2014-09-14: qty 5

## 2014-09-14 MED ORDER — ONDANSETRON 8 MG/NS 50 ML IVPB
INTRAVENOUS | Status: AC
  Filled 2014-09-14: qty 8

## 2014-09-14 MED ORDER — HYDROCODONE-ACETAMINOPHEN 5-325 MG PO TABS
2.0000 | ORAL_TABLET | ORAL | Status: DC | PRN
Start: 2014-09-14 — End: 2014-10-05

## 2014-09-14 MED ORDER — HEPARIN SOD (PORK) LOCK FLUSH 100 UNIT/ML IV SOLN
500.0000 [IU] | Freq: Once | INTRAVENOUS | Status: AC | PRN
  Administered 2014-09-14: 500 [IU]
  Filled 2014-09-14: qty 5

## 2014-09-14 MED ORDER — SODIUM CHLORIDE 0.9 % IJ SOLN
10.0000 mL | INTRAMUSCULAR | Status: DC | PRN
  Administered 2014-09-14: 10 mL
  Filled 2014-09-14: qty 10

## 2014-09-14 MED ORDER — SODIUM CHLORIDE 0.9 % IV SOLN
Freq: Once | INTRAVENOUS | Status: AC
  Administered 2014-09-14: 13:00:00 via INTRAVENOUS

## 2014-09-14 MED ORDER — DEXAMETHASONE SODIUM PHOSPHATE 10 MG/ML IJ SOLN
10.0000 mg | Freq: Once | INTRAMUSCULAR | Status: AC
  Administered 2014-09-14: 10 mg via INTRAVENOUS

## 2014-09-14 MED ORDER — SODIUM CHLORIDE 0.9 % IV SOLN
475.0000 mg/m2 | Freq: Once | INTRAVENOUS | Status: AC
  Administered 2014-09-14: 1000 mg via INTRAVENOUS
  Filled 2014-09-14: qty 40

## 2014-09-14 MED ORDER — DEXAMETHASONE SODIUM PHOSPHATE 10 MG/ML IJ SOLN
INTRAMUSCULAR | Status: AC
  Filled 2014-09-14: qty 1

## 2014-09-14 NOTE — Progress Notes (Signed)
Hematology and Oncology Follow Up Visit  Joe Park [id number] May 31, 1941 74 y.o. 09/14/2014 11:18 AM   Principle Diagnosis: 74 year old man diagnosed with bladder cancer in 09/2011. Now he has recurrent advanced disease.  Prior Therapy:  1. He is S/P cystoprostatectomy and bilateral pelvic lymph node dissection and creation of an ileal conduit done on 11/11/2011. 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 The pathological stage was T3b N0.  2. Systemic chemotherapy with carboplatin for an AUC of 5 given on day 1 and gemcitabine at 1000 mg per meter square given on days 1 and 8 every 3 weeks. He is S/P 6 cycles of therapy concluded on 03/09/2014.  Secondary Diagnosis: Prostate cancer diagnosed in 2002. Not active at this time.   Current therapy:  Systemic chemotherapy with Alimta at 500 mg/m given every 3 weeks. First cycle given 08/02/2014. He is here for cycle 3.  Interim History:  Joe Park returns today for a follow up visit. Since the last visit, he tolerated systemic chemotherapy with Alimta without difficulty. He has not reported any nausea, vomiting or rash. He did not have any infusion-related complications. He does report occasional pelvic discomfort especially when he is sitting down or laying down. He does take hydrocodone which have helped his symptoms.  He is not reporting any neurological deficits or weakness. He continues to have reasonable quality of life and continues to live independently. His appetite has been excellent.He dnies any fever chills, no nausea vomiting or night sweats. Denies cough or hemoptysis or hematemesis. Denies abdominal pain no hematochezia or melena. Has not reported any genitourinary complaints. He declined headaches or blurry vision or double vision. Did not report any syncope or seizures. Did not report any chest pain shortness of  breath or cough or hemoptysis. Did not report any hematochezia or melena. Does not report any urinary symptoms. Does not report any other musculoskeletal complaints. Did not report any arthralgias myalgias or rash or pruritus. Remainder or view of system is unremarkable.   Medications: I have reviewed the patient's current medications. Current Outpatient Prescriptions  Medication Sig Dispense Refill  . aspirin 81 MG chewable tablet Chew 81 mg by mouth daily with breakfast.     . Chlorhexidine Gluconate (PERIOGARD MT) Use as directed 0.5 oz in the mouth or throat at bedtime.     . folic acid (FOLVITE) 1 MG tablet Take 1 tablet (1 mg total) by mouth daily. 60 tablet 3  . levothyroxine (SYNTHROID, LEVOTHROID) 125 MCG tablet Take 125 mcg by mouth daily with breakfast.     . lidocaine-prilocaine (EMLA) cream Apply 1 application topically as needed. Apply approx 1/2 tsp to skin, over port-a-cath prior to chemotherapy treatments. 30 g 2  . lisinopril (PRINIVIL,ZESTRIL) 20 MG tablet Take 20 mg by mouth daily.    . Multiple Vitamin (MULITIVITAMIN WITH MINERALS) TABS Take 1 tablet by mouth every morning.    . naproxen sodium (ANAPROX) 220 MG tablet Take 660 mg by mouth 2 (two) times daily as needed (for pain.).    Marland Kitchen prochlorperazine (COMPAZINE) 10 MG tablet Take 1 tablet (10 mg total) by mouth every 6 (six) hours as needed for nausea or vomiting. 60 tablet 1  . simvastatin (ZOCOR) 40 MG tablet Take 40 mg by mouth at bedtime.    Marland Kitchen HYDROcodone-acetaminophen (NORCO/VICODIN) 5-325 MG per tablet Take 2 tablets by mouth every 4 (four) hours as needed.  30 tablet 0  . [DISCONTINUED] hydrALAZINE (APRESOLINE) 25 MG tablet Take 12.5 mg by mouth every morning.     No current facility-administered medications for this visit.   Facility-Administered Medications Ordered in Other Visits  Medication Dose Route Frequency Provider Last Rate Last Dose  . sodium chloride 0.9 % injection 10 mL  10 mL Intravenous PRN Wyatt Portela, MD   10 mL at 09/14/14 1047    Allergies: No Known Allergies  Past Medical History, Surgical history, Social history, and Family History were reviewed and updated.    Physical Exam: Blood pressure 140/70, pulse 92, temperature 97.5 F (36.4 C), temperature source Oral, resp. rate 19, height 5\' 11"  (1.803 m), weight 196 lb 1.6 oz (88.95 kg), SpO2 99 %. ECOG: 1 General appearance: alert awake not in any distress. Head: Atraumatic no abnormalities Neck: no adenopathy, no masses palpated. Lymph nodes: Cervical, supraclavicular, and axillary nodes normal. Heart:regular rate and rhythm, S1, S2. No murmurs rubs or gallops. Lung:chest clear, no wheezing, rales, normal symmetric air entry no dullness to percussion. Abdomen: soft, non-tender, without masses or organomegaly good bowel sounds noted. EXT:no erythema, induration, or nodules Neurological examination: No deficits at this time noted.  He has good range of motion in both hips.  Lab Results: Lab Results  Component Value Date   WBC 5.5 09/14/2014   HGB 9.2* 09/14/2014   HCT 28.8* 09/14/2014   MCV 85.8 09/14/2014   PLT 272 09/14/2014     Chemistry      Component Value Date/Time   NA 136 08/24/2014 1012   NA 140 02/24/2012 1249   K 3.3* 08/24/2014 1012   K 3.9 02/24/2012 1249   CL 106 12/27/2012 0801   CL 110 02/24/2012 1249   CO2 22 08/24/2014 1012   CO2 23 02/24/2012 1249   BUN 16.2 08/24/2014 1012   BUN 18 02/24/2012 1249   CREATININE 1.1 08/24/2014 1012   CREATININE 0.98 02/24/2012 1249      Component Value Date/Time   CALCIUM 9.4 08/24/2014 1012   CALCIUM 8.9 02/24/2012 1249   ALKPHOS 106 08/24/2014 1012   ALKPHOS 85 02/24/2012 1249   AST 21 08/24/2014 1012   AST 18 02/24/2012 1249   ALT 16 08/24/2014 1012   ALT 12 02/24/2012 1249   BILITOT 0.56 08/24/2014 1012   BILITOT 0.6 02/24/2012 1249     Impression and Plan:  74 year old with the following issue:  1.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 He developed pelvic recurrence and he is S/P 6 cycles of chemotherapy and have tolerated it well. CT scan on 07/17/2014  showed clear progression of disease. He is now being treated with salvage chemotherapy with single agent Alimta at 500 mg/m every 3 weeks. He is ready to proceed with cycle 3. The plan is to continue the current dose and schedule and we will staging with a CT scan after cycle 4. This will be scheduled today before he leaves the office.  2. Pelvic pain: Seems to be manageable with hydrocodone and I have refilled his medication today.  3. Anti-emetics: He has a prescription for antibiotics at this time.  4. Anemia: His hemoglobin appear adequate at this time.  5. IV access: Port-A-Cath in place and will be used for systemic chemotherapy.  6. Vitamin supplementation: He will continue on folic acid today 1 mg daily and we will continue vitamin B12 injections with every third cycle of Alimta.   7. Followup: On  10/05/2014 for cycle 4. He will have a CT scan scheduled on 10/24/2014 and M.D. follow-up on 10/26/2014 to discuss the results.  [facility] , MD 3/4/201611:18 AM

## 2014-09-14 NOTE — Telephone Encounter (Signed)
gv adn printed appt sched and avs for pt for March and April....sed added tx.    °

## 2014-09-14 NOTE — Patient Instructions (Signed)
[facility] Discharge Instructions for Patients Receiving Chemotherapy  Today you received the following chemotherapy agents Alimta  To help prevent nausea and vomiting after your treatment, we encourage you to take your nausea medication as prescribed.   If you develop nausea and vomiting that is not controlled by your nausea medication, call the clinic.   BELOW ARE SYMPTOMS THAT SHOULD BE REPORTED IMMEDIATELY:  *FEVER GREATER THAN 100.5 F  *CHILLS WITH OR WITHOUT FEVER  NAUSEA AND VOMITING THAT IS NOT CONTROLLED WITH YOUR NAUSEA MEDICATION  *UNUSUAL SHORTNESS OF BREATH  *UNUSUAL BRUISING OR BLEEDING  TENDERNESS IN MOUTH AND THROAT WITH OR WITHOUT PRESENCE OF ULCERS  *URINARY PROBLEMS  *BOWEL PROBLEMS  UNUSUAL RASH Items with * indicate a potential emergency and should be followed up as soon as possible.  Feel free to call the clinic you have any questions or concerns. The clinic phone number is [phone].    

## 2014-09-17 ENCOUNTER — Telehealth: Payer: Self-pay | Admitting: *Deleted

## 2014-09-17 NOTE — Telephone Encounter (Signed)
Pt called and left message that he had not had bowel movement in 3-4 days.  Spoke with pt and was informed that pt has been taking pain meds regularly.  Pt took 1 stool softener on Sat, and 2 stool softeners Sun with no results.  Instructed pt to get Magnesium Citrate and take it today, and have Miralax available also.   Instructed pt once constipation problem resolves, pt needs to take stool softeners daily, eating high fiber foods, and increased water intake.  Asked pt to call office back with update from constipation after taking Magnesium Citrate.  Pt voiced understanding. Pt's  Phone   [phone].

## 2014-10-05 ENCOUNTER — Encounter: Payer: Self-pay | Admitting: Physician Assistant

## 2014-10-05 ENCOUNTER — Ambulatory Visit: Payer: PPO

## 2014-10-05 ENCOUNTER — Other Ambulatory Visit (HOSPITAL_BASED_OUTPATIENT_CLINIC_OR_DEPARTMENT_OTHER): Payer: PPO

## 2014-10-05 ENCOUNTER — Ambulatory Visit (HOSPITAL_BASED_OUTPATIENT_CLINIC_OR_DEPARTMENT_OTHER): Payer: PPO | Admitting: Physician Assistant

## 2014-10-05 ENCOUNTER — Ambulatory Visit (HOSPITAL_BASED_OUTPATIENT_CLINIC_OR_DEPARTMENT_OTHER): Payer: PPO

## 2014-10-05 VITALS — BP 126/64 | HR 77 | Temp 97.9°F | Resp 18 | Ht 71.0 in | Wt 196.2 lb

## 2014-10-05 DIAGNOSIS — C670 Malignant neoplasm of trigone of bladder: Secondary | ICD-10-CM

## 2014-10-05 DIAGNOSIS — Z5111 Encounter for antineoplastic chemotherapy: Secondary | ICD-10-CM

## 2014-10-05 DIAGNOSIS — Z95828 Presence of other vascular implants and grafts: Secondary | ICD-10-CM

## 2014-10-05 DIAGNOSIS — C679 Malignant neoplasm of bladder, unspecified: Secondary | ICD-10-CM | POA: Diagnosis not present

## 2014-10-05 LAB — CBC WITH DIFFERENTIAL/PLATELET
BASO%: 0.4 % (ref 0.0–2.0)
Basophils Absolute: 0 10*3/uL (ref 0.0–0.1)
EOS%: 0.8 % (ref 0.0–7.0)
Eosinophils Absolute: 0 10*3/uL (ref 0.0–0.5)
HEMATOCRIT: 28.5 % — AB (ref 38.4–49.9)
HEMOGLOBIN: 9 g/dL — AB (ref 13.0–17.1)
LYMPH#: 1.1 10*3/uL (ref 0.9–3.3)
LYMPH%: 21.2 % (ref 14.0–49.0)
MCH: 27.9 pg (ref 27.2–33.4)
MCHC: 31.6 g/dL — ABNORMAL LOW (ref 32.0–36.0)
MCV: 88.2 fL (ref 79.3–98.0)
MONO#: 1.3 10*3/uL — AB (ref 0.1–0.9)
MONO%: 24.5 % — ABNORMAL HIGH (ref 0.0–14.0)
NEUT%: 53.1 % (ref 39.0–75.0)
NEUTROS ABS: 2.8 10*3/uL (ref 1.5–6.5)
Platelets: 250 10*3/uL (ref 140–400)
RBC: 3.23 10*6/uL — ABNORMAL LOW (ref 4.20–5.82)
RDW: 19.6 % — ABNORMAL HIGH (ref 11.0–14.6)
WBC: 5.2 10*3/uL (ref 4.0–10.3)

## 2014-10-05 LAB — COMPREHENSIVE METABOLIC PANEL (CC13)
ALBUMIN: 3.1 g/dL — AB (ref 3.5–5.0)
ALT: 26 U/L (ref 0–55)
ANION GAP: 11 meq/L (ref 3–11)
AST: 33 U/L (ref 5–34)
Alkaline Phosphatase: 119 U/L (ref 40–150)
BILIRUBIN TOTAL: 0.63 mg/dL (ref 0.20–1.20)
BUN: 18 mg/dL (ref 7.0–26.0)
CO2: 22 mEq/L (ref 22–29)
Calcium: 9.7 mg/dL (ref 8.4–10.4)
Chloride: 105 mEq/L (ref 98–109)
Creatinine: 1 mg/dL (ref 0.7–1.3)
EGFR: 76 mL/min/{1.73_m2} — AB (ref >90)
GLUCOSE: 87 mg/dL (ref 70–140)
Potassium: 4.1 mEq/L (ref 3.5–5.1)
Sodium: 138 mEq/L (ref 136–145)
Total Protein: 7.7 g/dL (ref 6.4–8.3)

## 2014-10-05 MED ORDER — HYDROCODONE-ACETAMINOPHEN 5-325 MG PO TABS
2.0000 | ORAL_TABLET | ORAL | Status: DC | PRN
Start: 2014-10-05 — End: 2014-10-26

## 2014-10-05 MED ORDER — SODIUM CHLORIDE 0.9 % IJ SOLN
10.0000 mL | INTRAMUSCULAR | Status: DC | PRN
  Administered 2014-10-05: 10 mL
  Filled 2014-10-05: qty 10

## 2014-10-05 MED ORDER — CYANOCOBALAMIN 1000 MCG/ML IJ SOLN
1000.0000 ug | Freq: Once | INTRAMUSCULAR | Status: AC
  Administered 2014-10-05: 1000 ug via INTRAMUSCULAR

## 2014-10-05 MED ORDER — SODIUM CHLORIDE 0.9 % IV SOLN
Freq: Once | INTRAVENOUS | Status: AC
  Administered 2014-10-05: 12:00:00 via INTRAVENOUS
  Filled 2014-10-05: qty 4

## 2014-10-05 MED ORDER — SODIUM CHLORIDE 0.9 % IJ SOLN
10.0000 mL | INTRAMUSCULAR | Status: AC | PRN
  Administered 2014-10-05: 10 mL via INTRAVENOUS
  Filled 2014-10-05: qty 10

## 2014-10-05 MED ORDER — CYANOCOBALAMIN 1000 MCG/ML IJ SOLN
INTRAMUSCULAR | Status: AC
  Filled 2014-10-05: qty 1

## 2014-10-05 MED ORDER — SODIUM CHLORIDE 0.9 % IV SOLN
470.0000 mg/m2 | Freq: Once | INTRAVENOUS | Status: AC
  Administered 2014-10-05: 1000 mg via INTRAVENOUS
  Filled 2014-10-05: qty 40

## 2014-10-05 MED ORDER — SODIUM CHLORIDE 0.9 % IV SOLN
Freq: Once | INTRAVENOUS | Status: AC
  Administered 2014-10-05: 12:00:00 via INTRAVENOUS

## 2014-10-05 MED ORDER — HEPARIN SOD (PORK) LOCK FLUSH 100 UNIT/ML IV SOLN
500.0000 [IU] | Freq: Once | INTRAVENOUS | Status: AC | PRN
  Administered 2014-10-05: 500 [IU]
  Filled 2014-10-05: qty 5

## 2014-10-05 NOTE — Patient Instructions (Signed)
[facility] Discharge Instructions for Patients Receiving Chemotherapy  Today you received the following chemotherapy agents Alimta.  To help prevent nausea and vomiting after your treatment, we encourage you to take your nausea medication as prescribed.   If you develop nausea and vomiting that is not controlled by your nausea medication, call the clinic.   BELOW ARE SYMPTOMS THAT SHOULD BE REPORTED IMMEDIATELY:  *FEVER GREATER THAN 100.5 F  *CHILLS WITH OR WITHOUT FEVER  NAUSEA AND VOMITING THAT IS NOT CONTROLLED WITH YOUR NAUSEA MEDICATION  *UNUSUAL SHORTNESS OF BREATH  *UNUSUAL BRUISING OR BLEEDING  TENDERNESS IN MOUTH AND THROAT WITH OR WITHOUT PRESENCE OF ULCERS  *URINARY PROBLEMS  *BOWEL PROBLEMS  UNUSUAL RASH Items with * indicate a potential emergency and should be followed up as soon as possible.  Feel free to call the clinic you have any questions or concerns. The clinic phone number is [phone].  Please show the CHEMO ALERT CARD at check-in to the Emergency Department and triage nurse.   

## 2014-10-05 NOTE — Patient Instructions (Signed)

## 2014-10-05 NOTE — Progress Notes (Signed)
Hematology and Oncology Follow Up Visit  Joe Park [id number] May 25, 1941 74 y.o. 10/05/2014 4:46 PM   Principle Diagnosis: 74 year old man diagnosed with bladder cancer in 09/2011. Now he has recurrent advanced disease.  Prior Therapy:  1. He is S/P cystoprostatectomy and bilateral pelvic lymph node dissection and creation of an ileal conduit done on 11/11/2011. 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 The pathological stage was T3b N0.  2. Systemic chemotherapy with carboplatin for an AUC of 5 given on day 1 and gemcitabine at 1000 mg per meter square given on days 1 and 8 every 3 weeks. He is S/P 6 cycles of therapy concluded on 03/09/2014.  Secondary Diagnosis: Prostate cancer diagnosed in 2002. Not active at this time.   Current therapy:  Systemic chemotherapy with Alimta at 500 mg/m given every 3 weeks. First cycle given 08/02/2014. He is here for cycle 4.  Interim History:  Joe Park returns today for a follow up visit. Since the last visit, he tolerated systemic chemotherapy with Alimta without difficulty. He has not reported any nausea, vomiting or rash. He did not have any infusion-related complications. He does report occasional pelvic discomfort especially when he is sitting down or laying down. He does take hydrocodone which have helped his symptoms. He reports occasional constipation but this is well managed with stool softeners. The patient states he had a skin cancer removed from his right ear at the beginning of March. He has a follow up appointment with his dermatologist on Nov 20, 2014. He is not reporting any neurological deficits or weakness. He continues to have reasonable quality of life and continues to live independently. His appetite has been excellent.He dnies any fever chills, no nausea vomiting or night sweats. Denies cough or hemoptysis or  hematemesis. Denies abdominal pain no hematochezia or melena. Has not reported any genitourinary complaints. He declined headaches or blurry vision or double vision. Did not report any syncope or seizures. Did not report any chest pain shortness of breath or cough or hemoptysis. Did not report any hematochezia or melena. Does not report any urinary symptoms. Does not report any other musculoskeletal complaints. Did not report any arthralgias myalgias or rash or pruritus. Remainder or view of system is unremarkable.   Medications: I have reviewed the patient's current medications. Current Outpatient Prescriptions  Medication Sig Dispense Refill  . aspirin 81 MG chewable tablet Chew 81 mg by mouth daily with breakfast.     . Chlorhexidine Gluconate (PERIOGARD MT) Use as directed 0.5 oz in the mouth or throat at bedtime.     . folic acid (FOLVITE) 1 MG tablet Take 1 tablet (1 mg total) by mouth daily. 60 tablet 3  . HYDROcodone-acetaminophen (NORCO/VICODIN) 5-325 MG per tablet Take 2 tablets by mouth every 4 (four) hours as needed. 30 tablet 0  . levothyroxine (SYNTHROID, LEVOTHROID) 125 MCG tablet Take 125 mcg by mouth daily with breakfast.     . lidocaine-prilocaine (EMLA) cream Apply 1 application topically as needed. Apply approx 1/2 tsp to skin, over port-a-cath prior to chemotherapy treatments. 30 g 2  . lisinopril (PRINIVIL,ZESTRIL) 20 MG tablet Take 20 mg by mouth daily.    . Multiple Vitamin (MULITIVITAMIN WITH MINERALS) TABS Take 1 tablet by mouth every morning.    . naproxen sodium (ANAPROX) 220 MG tablet Take 660 mg by mouth 2 (two) times daily as needed (for pain.).    Marland Kitchen  prochlorperazine (COMPAZINE) 10 MG tablet Take 1 tablet (10 mg total) by mouth every 6 (six) hours as needed for nausea or vomiting. 60 tablet 1  . simvastatin (ZOCOR) 40 MG tablet Take 40 mg by mouth at bedtime.    . [DISCONTINUED] hydrALAZINE (APRESOLINE) 25 MG tablet Take 12.5 mg by mouth every morning.     No current  facility-administered medications for this visit.   Facility-Administered Medications Ordered in Other Visits  Medication Dose Route Frequency Provider Last Rate Last Dose  . sodium chloride 0.9 % injection 10 mL  10 mL Intravenous PRN Wyatt Portela, MD   10 mL at 10/05/14 1129  . sodium chloride 0.9 % injection 10 mL  10 mL Intracatheter PRN Wyatt Portela, MD   10 mL at 10/05/14 1349    Allergies: No Known Allergies  Past Medical History, Surgical history, Social history, and Family History were reviewed and updated.    Physical Exam: Blood pressure 126/64, pulse 77, temperature 97.9 F (36.6 C), temperature source Oral, resp. rate 18, height 5\' 11"  (1.803 m), weight 196 lb 3.2 oz (88.996 kg), SpO2 100 %. ECOG: 1 General appearance: alert awake not in any distress. Head: Atraumatic no abnormalities Neck: no adenopathy, no masses palpated. Lymph nodes: Cervical, supraclavicular, and axillary nodes normal. Heart:regular rate and rhythm, S1, S2. No murmurs rubs or gallops. Lung:chest clear, no wheezing, rales, normal symmetric air entry no dullness to percussion. Abdomen: soft, non-tender, without masses or organomegaly good bowel sounds noted. EXT:no erythema, induration, or nodules Neurological examination: No deficits at this time noted.  He has good range of motion in both hips. Skin: right helix healing lesion, approximately 0.5 cm, no evidence of infection  Lab Results: Lab Results  Component Value Date   WBC 5.2 10/05/2014   HGB 9.0* 10/05/2014   HCT 28.5* 10/05/2014   MCV 88.2 10/05/2014   PLT 250 10/05/2014     Chemistry      Component Value Date/Time   NA 138 10/05/2014 1059   NA 140 02/24/2012 1249   K 4.1 10/05/2014 1059   K 3.9 02/24/2012 1249   CL 106 12/27/2012 0801   CL 110 02/24/2012 1249   CO2 22 10/05/2014 1059   CO2 23 02/24/2012 1249   BUN 18.0 10/05/2014 1059   BUN 18 02/24/2012 1249   CREATININE 1.0 10/05/2014 1059   CREATININE 0.98  02/24/2012 1249      Component Value Date/Time   CALCIUM 9.7 10/05/2014 1059   CALCIUM 8.9 02/24/2012 1249   ALKPHOS 119 10/05/2014 1059   ALKPHOS 85 02/24/2012 1249   AST 33 10/05/2014 1059   AST 18 02/24/2012 1249   ALT 26 10/05/2014 1059   ALT 12 02/24/2012 1249   BILITOT 0.63 10/05/2014 1059   BILITOT 0.6 02/24/2012 1249     Impression and Plan:  74 year old with the following issue:  1.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 He developed pelvic recurrence and he is S/P 6 cycles of chemotherapy and have tolerated it well. CT scan on 07/17/2014  showed clear progression of disease. He is now being treated with salvage chemotherapy with single agent Alimta at 500 mg/m every 3 weeks. He is ready to proceed with cycle 4. The plan is to continue the current dose and schedule and we will staging with a CT scan after cycle 4. This has been scheduled.   2. Pelvic pain: Seems to be manageable with hydrocodone and I have refilled  his medication today.  3. Anti-emetics: He has a prescription for antibiotics at this time.  4. Anemia: His hemoglobin appear adequate at this time.  5. IV access: Port-A-Cath in place and will be used for systemic chemotherapy.  6. Vitamin supplementation: He will continue on folic acid today 1 mg daily and we will continue vitamin B12 injections with every third cycle of Alimta.   7. Followup: He will have a CT scan as scheduled on 10/24/2014 and M.D. follow-up on 10/26/2014 to discuss the results.  [name] , [place]  3/25/20164:46 PM

## 2014-10-11 NOTE — Patient Instructions (Signed)
Keep you appointment for your restaging CT scan Follow up with Sr. Shadad as scheduled on 10/26/2014

## 2014-10-24 ENCOUNTER — Ambulatory Visit (HOSPITAL_COMMUNITY)
Admission: RE | Admit: 2014-10-24 | Discharge: 2014-10-24 | Disposition: A | Discharge status: Home / Self Care | Payer: PPO | Source: Ambulatory Visit | Attending: Oncology | Admitting: Oncology

## 2014-10-24 ENCOUNTER — Other Ambulatory Visit (HOSPITAL_BASED_OUTPATIENT_CLINIC_OR_DEPARTMENT_OTHER): Payer: PPO

## 2014-10-24 ENCOUNTER — Ambulatory Visit (HOSPITAL_BASED_OUTPATIENT_CLINIC_OR_DEPARTMENT_OTHER): Payer: PPO

## 2014-10-24 ENCOUNTER — Encounter (HOSPITAL_COMMUNITY): Payer: Self-pay

## 2014-10-24 VITALS — BP 136/71 | HR 71 | Temp 98.0°F

## 2014-10-24 DIAGNOSIS — C670 Malignant neoplasm of trigone of bladder: Secondary | ICD-10-CM

## 2014-10-24 DIAGNOSIS — Z08 Encounter for follow-up examination after completed treatment for malignant neoplasm: Secondary | ICD-10-CM | POA: Insufficient documentation

## 2014-10-24 DIAGNOSIS — C679 Malignant neoplasm of bladder, unspecified: Secondary | ICD-10-CM

## 2014-10-24 DIAGNOSIS — Z8546 Personal history of malignant neoplasm of prostate: Secondary | ICD-10-CM | POA: Diagnosis not present

## 2014-10-24 DIAGNOSIS — Z906 Acquired absence of other parts of urinary tract: Secondary | ICD-10-CM | POA: Insufficient documentation

## 2014-10-24 DIAGNOSIS — Z95828 Presence of other vascular implants and grafts: Secondary | ICD-10-CM

## 2014-10-24 LAB — COMPREHENSIVE METABOLIC PANEL (CC13)
ALBUMIN: 3.1 g/dL — AB (ref 3.5–5.0)
ALK PHOS: 119 U/L (ref 40–150)
ALT: 25 U/L (ref 0–55)
AST: 35 U/L — ABNORMAL HIGH (ref 5–34)
Anion Gap: 8 mEq/L (ref 3–11)
BILIRUBIN TOTAL: 0.45 mg/dL (ref 0.20–1.20)
BUN: 18.3 mg/dL (ref 7.0–26.0)
CO2: 23 mEq/L (ref 22–29)
CREATININE: 0.9 mg/dL (ref 0.7–1.3)
Calcium: 9.2 mg/dL (ref 8.4–10.4)
Chloride: 106 mEq/L (ref 98–109)
EGFR: 81 mL/min/{1.73_m2} — ABNORMAL LOW (ref >90)
Glucose: 101 mg/dl (ref 70–140)
POTASSIUM: 4 meq/L (ref 3.5–5.1)
Sodium: 137 mEq/L (ref 136–145)
Total Protein: 7.5 g/dL (ref 6.4–8.3)

## 2014-10-24 LAB — CBC WITH DIFFERENTIAL/PLATELET
BASO%: 0.2 % (ref 0.0–2.0)
Basophils Absolute: 0 10*3/uL (ref 0.0–0.1)
EOS%: 1.4 % (ref 0.0–7.0)
Eosinophils Absolute: 0.1 10*3/uL (ref 0.0–0.5)
HEMATOCRIT: 26.8 % — AB (ref 38.4–49.9)
HGB: 8.6 g/dL — ABNORMAL LOW (ref 13.0–17.1)
LYMPH%: 26 % (ref 14.0–49.0)
MCH: 28.5 pg (ref 27.2–33.4)
MCHC: 32.1 g/dL (ref 32.0–36.0)
MCV: 88.7 fL (ref 79.3–98.0)
MONO#: 1 10*3/uL — AB (ref 0.1–0.9)
MONO%: 20 % — AB (ref 0.0–14.0)
NEUT#: 2.7 10*3/uL (ref 1.5–6.5)
NEUT%: 52.4 % (ref 39.0–75.0)
PLATELETS: 237 10*3/uL (ref 140–400)
RBC: 3.02 10*6/uL — ABNORMAL LOW (ref 4.20–5.82)
RDW: 19.6 % — ABNORMAL HIGH (ref 11.0–14.6)
WBC: 5.2 10*3/uL (ref 4.0–10.3)
lymph#: 1.3 10*3/uL (ref 0.9–3.3)

## 2014-10-24 MED ORDER — IOHEXOL 300 MG/ML  SOLN
125.0000 mL | Freq: Once | INTRAMUSCULAR | Status: AC | PRN
  Administered 2014-10-24: 125 mL via INTRAVENOUS

## 2014-10-24 MED ORDER — SODIUM CHLORIDE 0.9 % IJ SOLN
10.0000 mL | INTRAMUSCULAR | Status: DC | PRN
  Administered 2014-10-24: 10 mL via INTRAVENOUS
  Filled 2014-10-24: qty 10

## 2014-10-24 MED ORDER — HEPARIN SOD (PORK) LOCK FLUSH 100 UNIT/ML IV SOLN
500.0000 [IU] | Freq: Once | INTRAVENOUS | Status: AC
  Administered 2014-10-24: 500 [IU] via INTRAVENOUS
  Filled 2014-10-24: qty 5

## 2014-10-24 NOTE — Patient Instructions (Signed)

## 2014-10-24 NOTE — Progress Notes (Signed)
Patient in for Port-A-Cath access and lab draw. Patient's port accessed with the Power Loc Safety Infusion Set and dressed with Tegaderm dressing for CT appointment. Port flushed and labs obtained without any difficulty. Explained to Patient that if the Power Loc needle is not removed after CT procedure, to return to the [place] when the CT is complete to have needle removed. Patient verbalized understanding. Patient discharged from the Flush Room without any complaints.

## 2014-10-26 ENCOUNTER — Encounter: Payer: Self-pay | Admitting: *Deleted

## 2014-10-26 ENCOUNTER — Other Ambulatory Visit: Payer: Self-pay | Admitting: *Deleted

## 2014-10-26 ENCOUNTER — Ambulatory Visit (HOSPITAL_BASED_OUTPATIENT_CLINIC_OR_DEPARTMENT_OTHER): Payer: PPO | Admitting: Oncology

## 2014-10-26 ENCOUNTER — Ambulatory Visit (HOSPITAL_BASED_OUTPATIENT_CLINIC_OR_DEPARTMENT_OTHER): Payer: PPO

## 2014-10-26 ENCOUNTER — Telehealth: Payer: Self-pay | Admitting: Oncology

## 2014-10-26 VITALS — BP 142/75 | HR 86 | Temp 98.4°F | Resp 18 | Ht 71.0 in | Wt 199.1 lb

## 2014-10-26 DIAGNOSIS — R102 Pelvic and perineal pain: Secondary | ICD-10-CM | POA: Diagnosis not present

## 2014-10-26 DIAGNOSIS — C679 Malignant neoplasm of bladder, unspecified: Secondary | ICD-10-CM

## 2014-10-26 DIAGNOSIS — Z8546 Personal history of malignant neoplasm of prostate: Secondary | ICD-10-CM | POA: Diagnosis not present

## 2014-10-26 DIAGNOSIS — Z5111 Encounter for antineoplastic chemotherapy: Secondary | ICD-10-CM

## 2014-10-26 DIAGNOSIS — C670 Malignant neoplasm of trigone of bladder: Secondary | ICD-10-CM

## 2014-10-26 MED ORDER — HYDROCODONE-ACETAMINOPHEN 5-325 MG PO TABS: 2.0000 | ORAL_TABLET | ORAL | Status: DC | PRN

## 2014-10-26 MED ORDER — HEPARIN SOD (PORK) LOCK FLUSH 100 UNIT/ML IV SOLN
500.0000 [IU] | Freq: Once | INTRAVENOUS | Status: AC | PRN
  Administered 2014-10-26: 500 [IU]
  Filled 2014-10-26: qty 5

## 2014-10-26 MED ORDER — SODIUM CHLORIDE 0.9 % IV SOLN
1000.0000 mg | Freq: Once | INTRAVENOUS | Status: AC
  Administered 2014-10-26: 1000 mg via INTRAVENOUS
  Filled 2014-10-26: qty 40

## 2014-10-26 MED ORDER — SODIUM CHLORIDE 0.9 % IV SOLN
Freq: Once | INTRAVENOUS | Status: AC
  Administered 2014-10-26: 13:00:00 via INTRAVENOUS
  Filled 2014-10-26: qty 4

## 2014-10-26 MED ORDER — SODIUM CHLORIDE 0.9 % IV SOLN
250.0000 mL | Freq: Once | INTRAVENOUS | Status: AC
  Administered 2014-10-26: 250 mL via INTRAVENOUS

## 2014-10-26 MED ORDER — SODIUM CHLORIDE 0.9 % IJ SOLN
10.0000 mL | INTRAMUSCULAR | Status: DC | PRN
  Administered 2014-10-26: 10 mL
  Filled 2014-10-26: qty 10

## 2014-10-26 NOTE — Patient Instructions (Signed)
[facility] Discharge Instructions for Patients Receiving Chemotherapy  Today you received the following chemotherapy agents; Alimta.   To help prevent nausea and vomiting after your treatment, we encourage you to take your nausea medication as directed.    If you develop nausea and vomiting that is not controlled by your nausea medication, call the clinic.   BELOW ARE SYMPTOMS THAT SHOULD BE REPORTED IMMEDIATELY:  *FEVER GREATER THAN 100.5 F  *CHILLS WITH OR WITHOUT FEVER  NAUSEA AND VOMITING THAT IS NOT CONTROLLED WITH YOUR NAUSEA MEDICATION  *UNUSUAL SHORTNESS OF BREATH  *UNUSUAL BRUISING OR BLEEDING  TENDERNESS IN MOUTH AND THROAT WITH OR WITHOUT PRESENCE OF ULCERS  *URINARY PROBLEMS  *BOWEL PROBLEMS  UNUSUAL RASH Items with * indicate a potential emergency and should be followed up as soon as possible.  Feel free to call the clinic you have any questions or concerns. The clinic phone number is [phone].  Please show the CHEMO ALERT CARD at check-in to the Emergency Department and triage nurse.   

## 2014-10-26 NOTE — Progress Notes (Signed)
Hematology and Oncology Follow Up Visit  Joe Park [id number] 1940-11-30 74 y.o. 10/26/2014 12:11 PM   Principle Diagnosis: 74 year old man diagnosed with bladder cancer in 09/2011. Now he has recurrent advanced disease.  Prior Therapy:  1. He is S/P cystoprostatectomy and bilateral pelvic lymph node dissection and creation of an ileal conduit done on 11/11/2011. 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 The pathological stage was T3b N0.  2. Systemic chemotherapy with carboplatin for an AUC of 5 given on day 1 and gemcitabine at 1000 mg per meter square given on days 1 and 8 every 3 weeks. He is S/P 6 cycles of therapy concluded on 03/09/2014.  Secondary Diagnosis: Prostate cancer diagnosed in 2002. He is status post therapy with radiation that likely caused his bladder cancer I was diagnosed in 2013.  Current therapy:  Systemic chemotherapy with Alimta at 500 mg/m given every 3 weeks. First cycle given 08/02/2014. He is here for cycle 5.  Interim History:  Joe Park returns today for a follow up visit. Since the last visit, he tolerated systemic chemotherapy with Alimta without difficulty. He has not reported any nausea, vomiting or rash. He did not have any infusion-related complications. He does report occasional pelvic discomfort especially when he is sitting down or laying down. He does take hydrocodone which have helped his symptoms. He reports rarely taking it sometimes once every 3 or 4 days. When he does take it and seems to have helped his symptoms. He is not reporting any neurological deficits or weakness. He continues to have reasonable quality of life and continues to live independently. His appetite has been excellent.He dnies any fever chills, no nausea vomiting or night sweats. Denies cough or hemoptysis or hematemesis. Denies abdominal pain no hematochezia or  melena. Has not reported any genitourinary complaints. He declined headaches or blurry vision or double vision. Did not report any syncope or seizures. Did not report any chest pain shortness of breath or cough or hemoptysis. Did not report any hematochezia or melena. Does not report any urinary symptoms. Does not report any other musculoskeletal complaints. Did not report any arthralgias myalgias or rash or pruritus. Remainder or view of system is unremarkable.   Medications: I have reviewed the patient's current medications. Current Outpatient Prescriptions  Medication Sig Dispense Refill  . aspirin 81 MG chewable tablet Chew 81 mg by mouth daily with breakfast.     . Chlorhexidine Gluconate (PERIOGARD MT) Use as directed 0.5 oz in the mouth or throat at bedtime.     . folic acid (FOLVITE) 1 MG tablet Take 1 tablet (1 mg total) by mouth daily. 60 tablet 3  . HYDROcodone-acetaminophen (NORCO/VICODIN) 5-325 MG per tablet Take 2 tablets by mouth every 4 (four) hours as needed. 30 tablet 0  . levothyroxine (SYNTHROID, LEVOTHROID) 125 MCG tablet Take 125 mcg by mouth daily with breakfast.     . lidocaine-prilocaine (EMLA) cream Apply 1 application topically as needed. Apply approx 1/2 tsp to skin, over port-a-cath prior to chemotherapy treatments. 30 g 2  . lisinopril (PRINIVIL,ZESTRIL) 20 MG tablet Take 20 mg by mouth daily.    . Multiple Vitamin (MULITIVITAMIN WITH MINERALS) TABS Take 1 tablet by mouth every morning.    . naproxen sodium (ANAPROX) 220 MG tablet Take 660 mg by mouth 2 (two) times daily as needed (for pain.).    Marland Kitchen prochlorperazine (COMPAZINE) 10 MG  tablet Take 1 tablet (10 mg total) by mouth every 6 (six) hours as needed for nausea or vomiting. 60 tablet 1  . simvastatin (ZOCOR) 40 MG tablet Take 40 mg by mouth at bedtime.    . [DISCONTINUED] hydrALAZINE (APRESOLINE) 25 MG tablet Take 12.5 mg by mouth every morning.     No current facility-administered medications for this visit.    Facility-Administered Medications Ordered in Other Visits  Medication Dose Route Frequency Provider Last Rate Last Dose  . sodium chloride 0.9 % injection 10 mL  10 mL Intravenous PRN Wyatt Portela, MD   10 mL at 10/05/14 1129    Allergies: No Known Allergies  Past Medical History, Surgical history, Social history, and Family History were reviewed and updated.    Physical Exam: Blood pressure 142/75, pulse 86, temperature 98.4 F (36.9 C), temperature source Oral, resp. rate 18, height 5\' 11"  (1.803 m), weight 199 lb 1.6 oz (90.311 kg), SpO2 97 %. ECOG: 1 General appearance: alert awake not in any distress. Head: Atraumatic no abnormalities Neck: no adenopathy, no masses palpated. Lymph nodes: Cervical, supraclavicular, and axillary nodes normal. Heart:regular rate and rhythm, S1, S2. No murmurs rubs or gallops. Lung:chest clear, no wheezing, rales, normal symmetric air entry no dullness to percussion. Abdomen: soft, non-tender, without masses or organomegaly good bowel sounds noted. EXT:no erythema, induration, or nodules Neurological examination: No deficits at this time noted.  He has good range of motion in both hips.   Lab Results: Lab Results  Component Value Date   WBC 5.2 10/24/2014   HGB 8.6* 10/24/2014   HCT 26.8* 10/24/2014   MCV 88.7 10/24/2014   PLT 237 10/24/2014     Chemistry      Component Value Date/Time   NA 137 10/24/2014 1057   NA 140 02/24/2012 1249   K 4.0 10/24/2014 1057   K 3.9 02/24/2012 1249   CL 106 12/27/2012 0801   CL 110 02/24/2012 1249   CO2 23 10/24/2014 1057   CO2 23 02/24/2012 1249   BUN 18.3 10/24/2014 1057   BUN 18 02/24/2012 1249   CREATININE 0.9 10/24/2014 1057   CREATININE 0.98 02/24/2012 1249      Component Value Date/Time   CALCIUM 9.2 10/24/2014 1057   CALCIUM 8.9 02/24/2012 1249   ALKPHOS 119 10/24/2014 1057   ALKPHOS 85 02/24/2012 1249   AST 35* 10/24/2014 1057   AST 18 02/24/2012 1249   ALT 25 10/24/2014 1057    ALT 12 02/24/2012 1249   BILITOT 0.45 10/24/2014 1057   BILITOT 0.6 02/24/2012 1249       EXAM: CT CHEST, ABDOMEN, AND PELVIS WITH CONTRAST  TECHNIQUE: Multidetector CT imaging of the chest, abdomen and pelvis was performed following the standard protocol during bolus administration of intravenous contrast.  CONTRAST: 139mL OMNIPAQUE IOHEXOL 300 MG/ML SOLN  COMPARISON: CTs 07/17/2014 and 03/21/2014.  FINDINGS: CT CHEST FINDINGS  Mediastinum/Nodes: There are no enlarged mediastinal, hilar or axillary lymph nodes. The thyroid gland, trachea and esophagus demonstrate no significant findings. The heart size is normal. There is trace pericardial fluid.Right IJ Port-A-Cath tip in the lower SVC. Stable mild atherosclerosis of the aorta, great vessels and coronary arteries.  Lungs/Pleura: There is no pleural effusion.4 mm perifissural nodule along the inferior aspect of the left major fissure on image 35 is stable. The lungs are otherwise clear without suspicious nodules.  Musculoskeletal/Chest wall: No evidence of chest wall mass or suspicious osseous finding.  CT ABDOMEN AND PELVIS FINDINGS  Hepatobiliary:  Stable 8 mm focus of hyper enhancement in the dome of the right hepatic lobe on image 46, likely a small hemangioma or other incidental vascular finding. No new or enlarging liver lesions. No evidence of gallstones, gallbladder wall thickening or biliary dilatation.  Pancreas: Unremarkable. No pancreatic ductal dilatation or surrounding inflammatory changes.  Spleen: Normal in size without focal abnormality.  Adrenals/Urinary Tract: The adrenal glands appear stable without suspicious findings.There is a stable small nonobstructing calculus in the lower pole of the right kidney. Post-contrast, small right renal cysts are stable. There is no significant ureteral dilatation status post cystectomy and urinary diversion. The ileal conduit appears stable  with stable parastomal herniation of omental fat.  Stomach/Bowel: No evidence of bowel wall thickening, distention or surrounding inflammatory change.  Vascular/Lymphatic: There are no enlarged abdominal or pelvic lymph nodes. Stable aortoiliac atherosclerosis.  Reproductive: Prostate brachytherapy seeds are noted status post cystectomy. There has been progressive enlargement of a large tetrafoil-shaped pelvic mass. This has a component extending superior to the prostate gland, measuring 6.0 x 5.2 cm. There is bilateral extension through the obturator foramina, worse on the right where there is a 7.8 cm component on image 124. There is also significant tumor extending along the base of the penis into the anterior perineum, measuring up to 11.9 x 7.8 cm transverse on image 131. The inferior extent of the tumor is not imaged. On the coronal images, tumor measures at least 10.5 x 13.1 cm (image 60).  Other: Stable asymmetric fat in the right inguinal canal.  Musculoskeletal: Again demonstrated are bilateral pubic rami fractures, mildly displaced on the right. Both rami demonstrate progressive ill-defined lysis which may be related to previous pelvic radiation and subsequent pathologic fractures. Direct intraosseous extension of tumor from the adjacent pelvic mass is difficult to exclude, although not strongly favored.  IMPRESSION: 1. Progressive enlargement of large tetrafoil pelvic mass consistent with local recurrence of tumor, presumably from the patient's bladder cancer. Tumor extends into both obturator foramina and the perineum. 2. The ureters and ileal conduit are patent. 3. No distant metastases identified. 4. Progressive lysis of the pubic bones with associated bilateral pathologic/insufficiency fractures.    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  He developed pelvic recurrence and he is S/P 6 cycles of chemotherapy and have tolerated it well. CT scan on 07/17/2014  showed clear progression of disease.   He is now being treated with salvage chemotherapy with single agent Alimta at 500 mg/m every 3 weeks. CT scan on 10/24/2014 was discussed and showed that he has local progression. He does not have systemic metastasis and is tolerating chemotherapy well. Risks and benefits of continuing this chemotherapy versus switching to a different agents were discussed. Given the fact that he is offered some palliation from this chemotherapy we have elected to continue on this regimen that other than switching. The plan is to proceed with chemotherapy today and repeat imaging studies after 3 more cycles.   2. Pelvic pain: Seems to be manageable with hydrocodone and I have refilled his medication today. I have encouraged him to take his medication more frequently as he might be underusing his pain medication.  3. Anti-emetics: He has a prescription for antibiotics at this time.  4. Anemia: His hemoglobin appear adequate at this time.  5. IV access: Port-A-Cath in place and will be used for systemic chemotherapy.  6. Vitamin supplementation: He will  continue on folic acid today 1 mg daily and we will continue vitamin B12 injections with every third cycle of Alimta.   7. Followup: In 3 weeks for the next cycle of chemotherapy.  Zola Button , MD 4/15/201612:11 PM

## 2014-10-26 NOTE — Progress Notes (Signed)
Copy of progress note 10/26/14 and labs 10/24/14 given to patient, per dr Hazeline Junker request.

## 2014-10-26 NOTE — Telephone Encounter (Signed)
Gave and printed appt sched and avs fo rpt for May..sed added tx

## 2014-11-06 ENCOUNTER — Telehealth: Payer: Self-pay | Admitting: *Deleted

## 2014-11-06 ENCOUNTER — Other Ambulatory Visit: Payer: Self-pay | Admitting: *Deleted

## 2014-11-06 DIAGNOSIS — C670 Malignant neoplasm of trigone of bladder: Secondary | ICD-10-CM

## 2014-11-06 MED ORDER — HYDROCODONE-ACETAMINOPHEN 5-325 MG PO TABS: 2.0000 | ORAL_TABLET | ORAL | Status: DC | PRN

## 2014-11-06 NOTE — Telephone Encounter (Signed)
Per dr Alen Blew, script for  Hydrocodone may be picked up tomorrow. Patient aware.

## 2014-11-06 NOTE — Telephone Encounter (Signed)
Call received from Mr. Donavan requesting refill for Hydrocone.  Request to provider for review.  Return number [phone].  Informed him staff will call when prescription ready for pick up.

## 2014-11-16 ENCOUNTER — Other Ambulatory Visit (HOSPITAL_BASED_OUTPATIENT_CLINIC_OR_DEPARTMENT_OTHER): Payer: PPO

## 2014-11-16 ENCOUNTER — Encounter: Payer: Self-pay | Admitting: Nurse Practitioner

## 2014-11-16 ENCOUNTER — Ambulatory Visit: Payer: PPO

## 2014-11-16 ENCOUNTER — Ambulatory Visit (HOSPITAL_BASED_OUTPATIENT_CLINIC_OR_DEPARTMENT_OTHER): Payer: PPO

## 2014-11-16 ENCOUNTER — Other Ambulatory Visit: Payer: Self-pay | Admitting: *Deleted

## 2014-11-16 ENCOUNTER — Ambulatory Visit (HOSPITAL_BASED_OUTPATIENT_CLINIC_OR_DEPARTMENT_OTHER): Payer: PPO | Admitting: Nurse Practitioner

## 2014-11-16 VITALS — BP 138/75 | HR 76 | Temp 97.9°F | Resp 18 | Ht 71.0 in | Wt 203.5 lb

## 2014-11-16 DIAGNOSIS — C679 Malignant neoplasm of bladder, unspecified: Secondary | ICD-10-CM

## 2014-11-16 DIAGNOSIS — Z8546 Personal history of malignant neoplasm of prostate: Secondary | ICD-10-CM | POA: Diagnosis not present

## 2014-11-16 DIAGNOSIS — R102 Pelvic and perineal pain: Secondary | ICD-10-CM

## 2014-11-16 DIAGNOSIS — Z5111 Encounter for antineoplastic chemotherapy: Secondary | ICD-10-CM | POA: Diagnosis not present

## 2014-11-16 DIAGNOSIS — Z95828 Presence of other vascular implants and grafts: Secondary | ICD-10-CM

## 2014-11-16 DIAGNOSIS — C670 Malignant neoplasm of trigone of bladder: Secondary | ICD-10-CM

## 2014-11-16 LAB — CBC WITH DIFFERENTIAL/PLATELET
BASO%: 0.4 % (ref 0.0–2.0)
BASOS ABS: 0 10*3/uL (ref 0.0–0.1)
EOS ABS: 0.1 10*3/uL (ref 0.0–0.5)
EOS%: 2.4 % (ref 0.0–7.0)
HEMATOCRIT: 27.9 % — AB (ref 38.4–49.9)
HGB: 9 g/dL — ABNORMAL LOW (ref 13.0–17.1)
LYMPH%: 25 % (ref 14.0–49.0)
MCH: 28.5 pg (ref 27.2–33.4)
MCHC: 32.3 g/dL (ref 32.0–36.0)
MCV: 88.3 fL (ref 79.3–98.0)
MONO#: 1.2 10*3/uL — ABNORMAL HIGH (ref 0.1–0.9)
MONO%: 22.2 % — ABNORMAL HIGH (ref 0.0–14.0)
NEUT%: 50 % (ref 39.0–75.0)
NEUTROS ABS: 2.7 10*3/uL (ref 1.5–6.5)
PLATELETS: 260 10*3/uL (ref 140–400)
RBC: 3.16 10*6/uL — ABNORMAL LOW (ref 4.20–5.82)
RDW: 18.5 % — ABNORMAL HIGH (ref 11.0–14.6)
WBC: 5.4 10*3/uL (ref 4.0–10.3)
lymph#: 1.3 10*3/uL (ref 0.9–3.3)
nRBC: 0 % (ref 0–0)

## 2014-11-16 LAB — COMPREHENSIVE METABOLIC PANEL (CC13)
ALK PHOS: 122 U/L (ref 40–150)
ALT: 26 U/L (ref 0–55)
AST: 34 U/L (ref 5–34)
Albumin: 3.1 g/dL — ABNORMAL LOW (ref 3.5–5.0)
Anion Gap: 13 mEq/L — ABNORMAL HIGH (ref 3–11)
BILIRUBIN TOTAL: 0.53 mg/dL (ref 0.20–1.20)
BUN: 16.5 mg/dL (ref 7.0–26.0)
CHLORIDE: 103 meq/L (ref 98–109)
CO2: 21 mEq/L — ABNORMAL LOW (ref 22–29)
CREATININE: 1 mg/dL (ref 0.7–1.3)
Calcium: 9.5 mg/dL (ref 8.4–10.4)
EGFR: 79 mL/min/{1.73_m2} — ABNORMAL LOW (ref >90)
GLUCOSE: 91 mg/dL (ref 70–140)
Potassium: 3.9 mEq/L (ref 3.5–5.1)
SODIUM: 138 meq/L (ref 136–145)
Total Protein: 7.6 g/dL (ref 6.4–8.3)

## 2014-11-16 LAB — TECHNOLOGIST REVIEW

## 2014-11-16 MED ORDER — HYDROCODONE-ACETAMINOPHEN 5-325 MG PO TABS: 2.0000 | ORAL_TABLET | ORAL | Status: DC | PRN

## 2014-11-16 MED ORDER — SODIUM CHLORIDE 0.9 % IV SOLN
475.0000 mg/m2 | Freq: Once | INTRAVENOUS | Status: AC
  Administered 2014-11-16: 1000 mg via INTRAVENOUS
  Filled 2014-11-16: qty 40

## 2014-11-16 MED ORDER — SODIUM CHLORIDE 0.9 % IJ SOLN
10.0000 mL | INTRAMUSCULAR | Status: DC | PRN
  Administered 2014-11-16: 10 mL
  Filled 2014-11-16: qty 10

## 2014-11-16 MED ORDER — HEPARIN SOD (PORK) LOCK FLUSH 100 UNIT/ML IV SOLN
500.0000 [IU] | Freq: Once | INTRAVENOUS | Status: AC | PRN
  Administered 2014-11-16: 500 [IU]
  Filled 2014-11-16: qty 5

## 2014-11-16 MED ORDER — SODIUM CHLORIDE 0.9 % IJ SOLN
10.0000 mL | INTRAMUSCULAR | Status: DC | PRN
  Administered 2014-11-16: 10 mL via INTRAVENOUS
  Filled 2014-11-16: qty 10

## 2014-11-16 MED ORDER — SODIUM CHLORIDE 0.9 % IV SOLN
Freq: Once | INTRAVENOUS | Status: AC
  Administered 2014-11-16: 13:00:00 via INTRAVENOUS
  Filled 2014-11-16: qty 4

## 2014-11-16 MED ORDER — SODIUM CHLORIDE 0.9 % IV SOLN
Freq: Once | INTRAVENOUS | Status: AC
  Administered 2014-11-16: 13:00:00 via INTRAVENOUS

## 2014-11-16 NOTE — Patient Instructions (Signed)

## 2014-11-16 NOTE — Patient Instructions (Signed)
[facility] Discharge Instructions for Patients Receiving Chemotherapy  Today you received the following chemotherapy agents: Alimta.  To help prevent nausea and vomiting after your treatment, we encourage you to take your nausea medication:Compazine 10 mg every 6 hours as needed.   If you develop nausea and vomiting that is not controlled by your nausea medication, call the clinic.   BELOW ARE SYMPTOMS THAT SHOULD BE REPORTED IMMEDIATELY:  *FEVER GREATER THAN 100.5 F  *CHILLS WITH OR WITHOUT FEVER  NAUSEA AND VOMITING THAT IS NOT CONTROLLED WITH YOUR NAUSEA MEDICATION  *UNUSUAL SHORTNESS OF BREATH  *UNUSUAL BRUISING OR BLEEDING  TENDERNESS IN MOUTH AND THROAT WITH OR WITHOUT PRESENCE OF ULCERS  *URINARY PROBLEMS  *BOWEL PROBLEMS  UNUSUAL RASH Items with * indicate a potential emergency and should be followed up as soon as possible.  Feel free to call the clinic you have any questions or concerns. The clinic phone number is [phone].  Please show the CHEMO ALERT CARD at check-in to the Emergency Department and triage nurse.   

## 2014-11-16 NOTE — Progress Notes (Signed)
Hematology and Oncology Follow Up Visit  Joe Park [id number] 08/03/1940 74 y.o. 11/16/2014 12:06 PM   Principle Diagnosis: 74 year old man diagnosed with bladder cancer in 09/2011. Now he has recurrent advanced disease.  Prior Therapy:  1. He is S/P cystoprostatectomy and bilateral pelvic lymph node dissection and creation of an ileal conduit done on 11/11/2011. 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 The pathological stage was T3b N0.  2. Systemic chemotherapy with carboplatin for an AUC of 5 given on day 1 and gemcitabine at 1000 mg per meter square given on days 1 and 8 every 3 weeks. He is S/P 6 cycles of therapy concluded on 03/09/2014.  Secondary Diagnosis: Prostate cancer diagnosed in 2002. He is status post therapy with radiation that likely caused his bladder cancer I was diagnosed in 2013.  Current therapy:  Systemic chemotherapy with Alimta at 500 mg/m given every 3 weeks. First cycle given 08/02/2014. He is here for cycle 6.  Interim History:  Joe Park returns today for a follow up visit. He continues to manage treatment well. He denies fevers, chills, nausea, or vomiting. His appetite is down some, but he makes sure to eat something every couple hours. He drinks lots of water He is using norco for his pelvic discomfort, about 3-4 tabs daily, and it is making him constipated. He has a "vegetable" stool softener that he is using but it has not helped much this week. His bilateral ankles are mildly edematous today. He complains of mild fatigue, though he sleeps well 80% of the time. He had an isolated dizzy spell yesterday, but is fine today.  He denies shortness of breath, chest pain, or palpitations. He denies cough or hemoptysis or hematemesis. Denies abdominal pain no hematochezia or melena. Has not reported any genitourinary complaints. He declined headaches  or blurry vision or double vision. Did not report any syncope or seizures. The remainder or view of system is unremarkable.   Medications: I have reviewed the patient's current medications. Current Outpatient Prescriptions  Medication Sig Dispense Refill  . aspirin 81 MG chewable tablet Chew 81 mg by mouth daily with breakfast.     . Chlorhexidine Gluconate (PERIOGARD MT) Use as directed 0.5 oz in the mouth or throat at bedtime.     . folic acid (FOLVITE) 1 MG tablet Take 1 tablet (1 mg total) by mouth daily. 60 tablet 3  . levothyroxine (SYNTHROID, LEVOTHROID) 125 MCG tablet Take 125 mcg by mouth daily with breakfast.     . lisinopril (PRINIVIL,ZESTRIL) 20 MG tablet Take 20 mg by mouth daily.    . Multiple Vitamin (MULITIVITAMIN WITH MINERALS) TABS Take 1 tablet by mouth every morning.    . SENNA PO Take 1 tablet by mouth 2 times daily at 12 noon and 4 pm.    . simvastatin (ZOCOR) 40 MG tablet Take 40 mg by mouth at bedtime.    Marland Kitchen HYDROcodone-acetaminophen (NORCO/VICODIN) 5-325 MG per tablet Take 2 tablets by mouth every 4 (four) hours as needed. 30 tablet 0  . lidocaine-prilocaine (EMLA) cream Apply 1 application topically as needed. Apply approx 1/2 tsp to skin, over port-a-cath prior to chemotherapy treatments. (Patient not taking: Reported on 11/16/2014) 30 g 2  . naproxen sodium (ANAPROX) 220 MG tablet Take 660 mg by mouth 2 (two) times daily as needed (for pain.).    Marland Kitchen prochlorperazine (COMPAZINE) 10 MG tablet Take  1 tablet (10 mg total) by mouth every 6 (six) hours as needed for nausea or vomiting. (Patient not taking: Reported on 11/16/2014) 60 tablet 1  . [DISCONTINUED] hydrALAZINE (APRESOLINE) 25 MG tablet Take 12.5 mg by mouth every morning.     No current facility-administered medications for this visit.   Facility-Administered Medications Ordered in Other Visits  Medication Dose Route Frequency Provider Last Rate Last Dose  . sodium chloride 0.9 % injection 10 mL  10 mL Intravenous  PRN Wyatt Portela, MD   10 mL at 10/05/14 1129  . sodium chloride 0.9 % injection 10 mL  10 mL Intravenous PRN Wyatt Portela, MD   10 mL at 11/16/14 1107    Allergies: No Known Allergies  Past Medical History, Surgical history, Social history, and Family History were reviewed and updated.    Physical Exam: Blood pressure 138/75, pulse 76, temperature 97.9 F (36.6 C), temperature source Oral, resp. rate 18, height 5\' 11"  (1.803 m), weight 203 lb 8 oz (92.307 kg). ECOG: 1 General appearance: alert awake not in any distress. Head: Atraumatic no abnormalities Neck: no adenopathy, no masses palpated. Lymph nodes: Cervical, supraclavicular, and axillary nodes normal. Heart:regular rate and rhythm, S1, S2. No murmurs rubs or gallops. Lung:chest clear, no wheezing, rales, normal symmetric air entry no dullness to percussion. Abdomen: soft, non-tender, without masses or organomegaly good bowel sounds noted. EXT:no erythema, induration, or nodules Neurological examination: No deficits at this time noted.  He has good range of motion in both hips.   Lab Results: Lab Results  Component Value Date   WBC 5.4 11/16/2014   HGB 9.0* 11/16/2014   HCT 27.9* 11/16/2014   MCV 88.3 11/16/2014   PLT 260 11/16/2014     Chemistry      Component Value Date/Time   NA 138 11/16/2014 1059   NA 140 02/24/2012 1249   K 3.9 11/16/2014 1059   K 3.9 02/24/2012 1249   CL 106 12/27/2012 0801   CL 110 02/24/2012 1249   CO2 21* 11/16/2014 1059   CO2 23 02/24/2012 1249   BUN 16.5 11/16/2014 1059   BUN 18 02/24/2012 1249   CREATININE 1.0 11/16/2014 1059   CREATININE 0.98 02/24/2012 1249      Component Value Date/Time   CALCIUM 9.5 11/16/2014 1059   CALCIUM 8.9 02/24/2012 1249   ALKPHOS 122 11/16/2014 1059   ALKPHOS 85 02/24/2012 1249   AST 34 11/16/2014 1059   AST 18 02/24/2012 1249   ALT 26 11/16/2014 1059   ALT 12 02/24/2012 1249   BILITOT 0.53 11/16/2014 1059   BILITOT 0.6 02/24/2012 1249        EXAM: CT CHEST, ABDOMEN, AND PELVIS WITH CONTRAST  TECHNIQUE: Multidetector CT imaging of the chest, abdomen and pelvis was performed following the standard protocol during bolus administration of intravenous contrast.  CONTRAST: 170mL OMNIPAQUE IOHEXOL 300 MG/ML SOLN  COMPARISON: CTs 07/17/2014 and 03/21/2014.  FINDINGS: CT CHEST FINDINGS  Mediastinum/Nodes: There are no enlarged mediastinal, hilar or axillary lymph nodes. The thyroid gland, trachea and esophagus demonstrate no significant findings. The heart size is normal. There is trace pericardial fluid.Right IJ Port-A-Cath tip in the lower SVC. Stable mild atherosclerosis of the aorta, great vessels and coronary arteries.  Lungs/Pleura: There is no pleural effusion.4 mm perifissural nodule along the inferior aspect of the left major fissure on image 35 is stable. The lungs are otherwise clear without suspicious nodules.  Musculoskeletal/Chest wall: No evidence of chest wall mass or suspicious  osseous finding.  CT ABDOMEN AND PELVIS FINDINGS  Hepatobiliary: Stable 8 mm focus of hyper enhancement in the dome of the right hepatic lobe on image 46, likely a small hemangioma or other incidental vascular finding. No new or enlarging liver lesions. No evidence of gallstones, gallbladder wall thickening or biliary dilatation.  Pancreas: Unremarkable. No pancreatic ductal dilatation or surrounding inflammatory changes.  Spleen: Normal in size without focal abnormality.  Adrenals/Urinary Tract: The adrenal glands appear stable without suspicious findings.There is a stable small nonobstructing calculus in the lower pole of the right kidney. Post-contrast, small right renal cysts are stable. There is no significant ureteral dilatation status post cystectomy and urinary diversion. The ileal conduit appears stable with stable parastomal herniation of omental fat.  Stomach/Bowel: No evidence of  bowel wall thickening, distention or surrounding inflammatory change.  Vascular/Lymphatic: There are no enlarged abdominal or pelvic lymph nodes. Stable aortoiliac atherosclerosis.  Reproductive: Prostate brachytherapy seeds are noted status post cystectomy. There has been progressive enlargement of a large tetrafoil-shaped pelvic mass. This has a component extending superior to the prostate gland, measuring 6.0 x 5.2 cm. There is bilateral extension through the obturator foramina, worse on the right where there is a 7.8 cm component on image 124. There is also significant tumor extending along the base of the penis into the anterior perineum, measuring up to 11.9 x 7.8 cm transverse on image 131. The inferior extent of the tumor is not imaged. On the coronal images, tumor measures at least 10.5 x 13.1 cm (image 60).  Other: Stable asymmetric fat in the right inguinal canal.  Musculoskeletal: Again demonstrated are bilateral pubic rami fractures, mildly displaced on the right. Both rami demonstrate progressive ill-defined lysis which may be related to previous pelvic radiation and subsequent pathologic fractures. Direct intraosseous extension of tumor from the adjacent pelvic mass is difficult to exclude, although not strongly favored.  IMPRESSION: 1. Progressive enlargement of large tetrafoil pelvic mass consistent with local recurrence of tumor, presumably from the patient's bladder cancer. Tumor extends into both obturator foramina and the perineum. 2. The ureters and ileal conduit are patent. 3. No distant metastases identified. 4. Progressive lysis of the pubic bones with associated bilateral pathologic/insufficiency fractures.    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 He developed pelvic recurrence and he is S/P 6 cycles of chemotherapy and have  tolerated it well. CT scan on 07/17/2014 showed clear progression of disease.   He is now being treated with salvage chemotherapy with single agent Alimta at 500 mg/m every 3 weeks. CT scan on 10/24/2014 was discussed and showed that he has local progression. He does not have systemic metastasis and is tolerating chemotherapy well. Risks and benefits of continuing this chemotherapy versus switching to a different agents were discussed. Given the fact that he is offered some palliation from this chemotherapy we have elected to continue on this regimen that other than switching. The plan is to proceed with cycle 6 today and repeat imaging studies after cycle 8 or 9.   2. Pelvic pain: Seems to be manageable with hydrocodone and I have refilled his medication today. He has increased his frequency of usage as advised at his last office visit.  3. Anti-emetics: He has a prescription for compazine at this time.  4. Anemia: His hemoglobin appears adequate at this time.  5. IV access: Port-A-Cath in place and will be used for systemic  chemotherapy.  6. Vitamin supplementation: He will continue on folic acid today 1 mg daily and we will continue vitamin B12 injections with every third cycle of Alimta.   7. Followup: In 3 weeks for the next cycle of chemotherapy.  Laurie Panda, NP  5/6/201612:06 PM

## 2014-12-07 ENCOUNTER — Ambulatory Visit: Payer: PPO

## 2014-12-07 ENCOUNTER — Ambulatory Visit (HOSPITAL_BASED_OUTPATIENT_CLINIC_OR_DEPARTMENT_OTHER): Payer: PPO

## 2014-12-07 ENCOUNTER — Other Ambulatory Visit (HOSPITAL_BASED_OUTPATIENT_CLINIC_OR_DEPARTMENT_OTHER): Payer: PPO

## 2014-12-07 ENCOUNTER — Ambulatory Visit (HOSPITAL_BASED_OUTPATIENT_CLINIC_OR_DEPARTMENT_OTHER): Payer: PPO | Admitting: Oncology

## 2014-12-07 ENCOUNTER — Telehealth: Payer: Self-pay | Admitting: *Deleted

## 2014-12-07 ENCOUNTER — Other Ambulatory Visit: Payer: Self-pay | Admitting: *Deleted

## 2014-12-07 VITALS — BP 121/69 | HR 94 | Temp 97.9°F | Resp 19 | Ht 71.0 in | Wt 197.1 lb

## 2014-12-07 DIAGNOSIS — R102 Pelvic and perineal pain: Secondary | ICD-10-CM | POA: Diagnosis not present

## 2014-12-07 DIAGNOSIS — Z8546 Personal history of malignant neoplasm of prostate: Secondary | ICD-10-CM

## 2014-12-07 DIAGNOSIS — C679 Malignant neoplasm of bladder, unspecified: Secondary | ICD-10-CM

## 2014-12-07 DIAGNOSIS — Z5111 Encounter for antineoplastic chemotherapy: Secondary | ICD-10-CM | POA: Diagnosis not present

## 2014-12-07 DIAGNOSIS — Z95828 Presence of other vascular implants and grafts: Secondary | ICD-10-CM

## 2014-12-07 DIAGNOSIS — C670 Malignant neoplasm of trigone of bladder: Secondary | ICD-10-CM

## 2014-12-07 LAB — CBC WITH DIFFERENTIAL/PLATELET
BASO%: 0.1 % (ref 0.0–2.0)
Basophils Absolute: 0 10*3/uL (ref 0.0–0.1)
EOS%: 1.4 % (ref 0.0–7.0)
Eosinophils Absolute: 0.1 10*3/uL (ref 0.0–0.5)
HCT: 26.7 % — ABNORMAL LOW (ref 38.4–49.9)
HEMOGLOBIN: 8.5 g/dL — AB (ref 13.0–17.1)
LYMPH#: 1.2 10*3/uL (ref 0.9–3.3)
LYMPH%: 15.7 % (ref 14.0–49.0)
MCH: 28.1 pg (ref 27.2–33.4)
MCHC: 31.8 g/dL — AB (ref 32.0–36.0)
MCV: 88.4 fL (ref 79.3–98.0)
MONO#: 1.9 10*3/uL — AB (ref 0.1–0.9)
MONO%: 24.8 % — AB (ref 0.0–14.0)
NEUT#: 4.5 10*3/uL (ref 1.5–6.5)
NEUT%: 58 % (ref 39.0–75.0)
NRBC: 0 % (ref 0–0)
Platelets: 279 10*3/uL (ref 140–400)
RBC: 3.02 10*6/uL — ABNORMAL LOW (ref 4.20–5.82)
RDW: 18.6 % — ABNORMAL HIGH (ref 11.0–14.6)
WBC: 7.7 10*3/uL (ref 4.0–10.3)

## 2014-12-07 LAB — COMPREHENSIVE METABOLIC PANEL (CC13)
ALBUMIN: 2.9 g/dL — AB (ref 3.5–5.0)
ALK PHOS: 125 U/L (ref 40–150)
ALT: 32 U/L (ref 0–55)
ANION GAP: 12 meq/L — AB (ref 3–11)
AST: 40 U/L — AB (ref 5–34)
BUN: 18.3 mg/dL (ref 7.0–26.0)
CO2: 22 mEq/L (ref 22–29)
Calcium: 9.6 mg/dL (ref 8.4–10.4)
Chloride: 103 mEq/L (ref 98–109)
Creatinine: 1 mg/dL (ref 0.7–1.3)
EGFR: 72 mL/min/{1.73_m2} — AB (ref >90)
Glucose: 102 mg/dl (ref 70–140)
POTASSIUM: 4.1 meq/L (ref 3.5–5.1)
SODIUM: 136 meq/L (ref 136–145)
TOTAL PROTEIN: 7.9 g/dL (ref 6.4–8.3)
Total Bilirubin: 0.72 mg/dL (ref 0.20–1.20)

## 2014-12-07 MED ORDER — SODIUM CHLORIDE 0.9 % IV SOLN
475.0000 mg/m2 | Freq: Once | INTRAVENOUS | Status: AC
  Administered 2014-12-07: 1000 mg via INTRAVENOUS
  Filled 2014-12-07: qty 40

## 2014-12-07 MED ORDER — OXYCODONE-ACETAMINOPHEN 5-325 MG PO TABS
2.0000 | ORAL_TABLET | Freq: Once | ORAL | Status: AC
  Administered 2014-12-07: 2 via ORAL

## 2014-12-07 MED ORDER — HYDROCODONE-ACETAMINOPHEN 5-325 MG PO TABS: 2.0000 | ORAL_TABLET | ORAL | Status: DC | PRN

## 2014-12-07 MED ORDER — DEXAMETHASONE SODIUM PHOSPHATE 100 MG/10ML IJ SOLN
Freq: Once | INTRAMUSCULAR | Status: AC
  Administered 2014-12-07: 13:00:00 via INTRAVENOUS
  Filled 2014-12-07: qty 4

## 2014-12-07 MED ORDER — SODIUM CHLORIDE 0.9 % IJ SOLN
10.0000 mL | INTRAMUSCULAR | Status: DC | PRN
  Administered 2014-12-07: 10 mL via INTRAVENOUS
  Filled 2014-12-07: qty 10

## 2014-12-07 MED ORDER — SODIUM CHLORIDE 0.9 % IJ SOLN
10.0000 mL | INTRAMUSCULAR | Status: DC | PRN
  Administered 2014-12-07: 10 mL
  Filled 2014-12-07: qty 10

## 2014-12-07 MED ORDER — SODIUM CHLORIDE 0.9 % IV SOLN
Freq: Once | INTRAVENOUS | Status: AC
  Administered 2014-12-07: 13:00:00 via INTRAVENOUS

## 2014-12-07 MED ORDER — CYANOCOBALAMIN 1000 MCG/ML IJ SOLN
INTRAMUSCULAR | Status: AC
  Filled 2014-12-07: qty 1

## 2014-12-07 MED ORDER — CYANOCOBALAMIN 1000 MCG/ML IJ SOLN
1000.0000 ug | Freq: Once | INTRAMUSCULAR | Status: AC
  Administered 2014-12-07: 1000 ug via INTRAMUSCULAR

## 2014-12-07 MED ORDER — HEPARIN SOD (PORK) LOCK FLUSH 100 UNIT/ML IV SOLN
500.0000 [IU] | Freq: Once | INTRAVENOUS | Status: AC | PRN
  Administered 2014-12-07: 500 [IU]
  Filled 2014-12-07: qty 5

## 2014-12-07 NOTE — Patient Instructions (Signed)

## 2014-12-07 NOTE — Progress Notes (Signed)
Hematology and Oncology Follow Up Visit  Joe Park [id number] [birth year] 74 y.o. 12/07/2014 11:52 AM   Principle Diagnosis: 74 year old man diagnosed with bladder cancer in 09/2011. Now he has recurrent advanced disease.  Prior Therapy:  1. He is S/P cystoprostatectomy and bilateral pelvic lymph node dissection and creation of an ileal conduit done on 11/11/2011. 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 The pathological stage was T3b N0.  2. Systemic chemotherapy with carboplatin for an AUC of 5 given on day 1 and gemcitabine at 1000 mg per meter square given on days 1 and 8 every 3 weeks. He is S/P 6 cycles of therapy concluded on 03/09/2014.  Secondary Diagnosis: Prostate cancer diagnosed in 2002. He is status post therapy with radiation that likely caused his bladder cancer I was diagnosed in 2013.  Current therapy:  Systemic chemotherapy with Alimta at 500 mg/m given every 3 weeks. First cycle given 08/02/2014. He is here for cycle 6.  Interim History:  Joe Park returns today for a follow up visit. Since the last visit, he reports no new complaints. He continues to tolerate systemic chemotherapy with Alimta without difficulty. He has not reported any nausea, vomiting or rash. He did not have any infusion-related complications. He does report occasional pelvic discomfort and  does take hydrocodone which have helped his symptoms. He reports rarely taking it sometimes once every 3  days. Marland Kitchen He is not reporting any neurological deficits or weakness. He continues to have reasonable quality of life and continues to live independently. His appetite has been excellent.He dnies any fever chills, no nausea vomiting or night sweats. Denies cough or hemoptysis or hematemesis. Denies abdominal pain no hematochezia or melena. Has not reported any genitourinary complaints. He declined  headaches or blurry vision or double vision. Did not report any syncope or seizures. Did not report any chest pain shortness of breath or cough or hemoptysis. Did not report any hematochezia or melena. Does not report any urinary symptoms. Does not report any other musculoskeletal complaints. Did not report any arthralgias myalgias or rash or pruritus. Remainder or view of system is unremarkable.   Medications: I have reviewed the patient's current medications. Current Outpatient Prescriptions  Medication Sig Dispense Refill  . aspirin 81 MG chewable tablet Chew 81 mg by mouth daily with breakfast.     . Chlorhexidine Gluconate (PERIOGARD MT) Use as directed 0.5 oz in the mouth or throat at bedtime.     . folic acid (FOLVITE) 1 MG tablet Take 1 tablet (1 mg total) by mouth daily. 60 tablet 3  . HYDROcodone-acetaminophen (NORCO/VICODIN) 5-325 MG per tablet Take 2 tablets by mouth every 4 (four) hours as needed. 30 tablet 0  . levothyroxine (SYNTHROID, LEVOTHROID) 125 MCG tablet Take 125 mcg by mouth daily with breakfast.     . lidocaine-prilocaine (EMLA) cream Apply 1 application topically as needed. Apply approx 1/2 tsp to skin, over port-a-cath prior to chemotherapy treatments. (Patient not taking: Reported on 11/16/2014) 30 g 2  . lisinopril (PRINIVIL,ZESTRIL) 20 MG tablet Take 20 mg by mouth daily.    . Multiple Vitamin (MULITIVITAMIN WITH MINERALS) TABS Take 1 tablet by mouth every morning.    . naproxen sodium (ANAPROX) 220 MG tablet Take 660 mg by mouth 2 (two) times daily as needed (for pain.).    Marland Kitchen prochlorperazine (COMPAZINE) 10 MG tablet Take 1 tablet (10 mg total)  by mouth every 6 (six) hours as needed for nausea or vomiting. (Patient not taking: Reported on 11/16/2014) 60 tablet 1  . SENNA PO Take 1 tablet by mouth 2 times daily at 12 noon and 4 pm.    . simvastatin (ZOCOR) 40 MG tablet Take 40 mg by mouth at bedtime.    . [DISCONTINUED] hydrALAZINE (APRESOLINE) 25 MG tablet Take 12.5 mg by  mouth every morning.     No current facility-administered medications for this visit.   Facility-Administered Medications Ordered in Other Visits  Medication Dose Route Frequency Provider Last Rate Last Dose  . sodium chloride 0.9 % injection 10 mL  10 mL Intravenous PRN Wyatt Portela, MD   10 mL at 10/05/14 1129    Allergies: No Known Allergies  Past Medical History, Surgical history, Social history, and Family History were reviewed and updated.    Physical Exam: Blood pressure 121/69, pulse 94, temperature 97.9 F (36.6 C), temperature source Oral, resp. rate 19, height 5\' 11"  (1.803 m), weight 197 lb 1.6 oz (89.404 kg), SpO2 98 %. ECOG: 1 General appearance: alert awake not in any distress. Head: Atraumatic no abnormalities Neck: no adenopathy, no masses palpated. Lymph nodes: Cervical, supraclavicular, and axillary nodes normal. Heart:regular rate and rhythm, S1, S2. No murmurs rubs or gallops. Lung:chest clear, no wheezing, rales, normal symmetric air entry no dullness to percussion. Abdomen: soft, non-tender, without masses or organomegaly good bowel sounds noted. EXT:no erythema, induration, or nodules Neurological examination: No deficits at this time noted.  He has good range of motion in both hips.   Lab Results: Lab Results  Component Value Date   WBC 7.7 12/07/2014   HGB 8.5* 12/07/2014   HCT 26.7* 12/07/2014   MCV 88.4 12/07/2014   PLT 279 12/07/2014     Chemistry      Component Value Date/Time   NA 138 11/16/2014 1059   NA 140 02/24/2012 1249   K 3.9 11/16/2014 1059   K 3.9 02/24/2012 1249   CL 106 12/27/2012 0801   CL 110 02/24/2012 1249   CO2 21* 11/16/2014 1059   CO2 23 02/24/2012 1249   BUN 16.5 11/16/2014 1059   BUN 18 02/24/2012 1249   CREATININE 1.0 11/16/2014 1059   CREATININE 0.98 02/24/2012 1249      Component Value Date/Time   CALCIUM 9.5 11/16/2014 1059   CALCIUM 8.9 02/24/2012 1249   ALKPHOS 122 11/16/2014 1059   ALKPHOS 85  02/24/2012 1249   AST 34 11/16/2014 1059   AST 18 02/24/2012 1249   ALT 26 11/16/2014 1059   ALT 12 02/24/2012 1249   BILITOT 0.53 11/16/2014 1059   BILITOT 0.6 02/24/2012 1249         Impression and Plan:  74 year old with the following issue:  1.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 He developed pelvic recurrence and he is S/P 6 cycles of chemotherapy and have tolerated it well. CT scan on 07/17/2014  showed clear progression of disease.   He is now being treated with salvage chemotherapy with single agent Alimta at 500 mg/m every 3 weeks. CT scan on 10/24/2014  showed that he has local progression  disease was reasonably controlled otherwise. We have elected to continue with the same regimen for the time being.  The plan is to proceed with chemotherapy today and in 3 weeks. I plan to repeat imaging studies in July 2016 which will be scheduled today.    2. Pelvic pain:  Seems to be manageable with hydrocodone and I have refilled his medication today.  3. Anti-emetics: He has a prescription for antibiotics at this time.  4. Anemia: His hemoglobin appear adequate at this time.  5. IV access: Port-A-Cath in place and will be used for systemic chemotherapy.  6. Vitamin supplementation: He will continue on folic acid today 1 mg daily and we will continue vitamin B12 injections with every third cycle of Alimta.   7. Followup: In 3 weeks for the next cycle of chemotherapy.  [facility] , MD 5/27/201611:52 AM

## 2014-12-07 NOTE — Patient Instructions (Signed)
[facility] Discharge Instructions for Patients Receiving Chemotherapy  Today you received the following chemotherapy agents; Alimta.   To help prevent nausea and vomiting after your treatment, we encourage you to take your nausea medication as directed.    If you develop nausea and vomiting that is not controlled by your nausea medication, call the clinic.   BELOW ARE SYMPTOMS THAT SHOULD BE REPORTED IMMEDIATELY:  *FEVER GREATER THAN 100.5 F  *CHILLS WITH OR WITHOUT FEVER  NAUSEA AND VOMITING THAT IS NOT CONTROLLED WITH YOUR NAUSEA MEDICATION  *UNUSUAL SHORTNESS OF BREATH  *UNUSUAL BRUISING OR BLEEDING  TENDERNESS IN MOUTH AND THROAT WITH OR WITHOUT PRESENCE OF ULCERS  *URINARY PROBLEMS  *BOWEL PROBLEMS  UNUSUAL RASH Items with * indicate a potential emergency and should be followed up as soon as possible.  Feel free to call the clinic you have any questions or concerns. The clinic phone number is [phone].  Please show the CHEMO ALERT CARD at check-in to the Emergency Department and triage nurse.   

## 2014-12-07 NOTE — Telephone Encounter (Signed)
Per staff message and POF I have scheduled appts. Advised scheduler of appts. JMW  

## 2014-12-10 ENCOUNTER — Telehealth: Payer: Self-pay | Admitting: Oncology

## 2014-12-10 NOTE — Telephone Encounter (Signed)
Pt confirmed labs/ov per 05/27 POF, gave pt AVS and Calendar, sent msg to add chemo...Marland KitchenMarland Kitchen

## 2014-12-28 ENCOUNTER — Ambulatory Visit (HOSPITAL_BASED_OUTPATIENT_CLINIC_OR_DEPARTMENT_OTHER): Payer: PPO

## 2014-12-28 ENCOUNTER — Ambulatory Visit: Payer: PPO

## 2014-12-28 ENCOUNTER — Telehealth: Payer: Self-pay | Admitting: Oncology

## 2014-12-28 ENCOUNTER — Encounter: Payer: Self-pay | Admitting: Physician Assistant

## 2014-12-28 ENCOUNTER — Other Ambulatory Visit (HOSPITAL_BASED_OUTPATIENT_CLINIC_OR_DEPARTMENT_OTHER): Payer: PPO

## 2014-12-28 ENCOUNTER — Ambulatory Visit (HOSPITAL_BASED_OUTPATIENT_CLINIC_OR_DEPARTMENT_OTHER): Payer: PPO | Admitting: Physician Assistant

## 2014-12-28 VITALS — BP 142/65 | HR 100 | Temp 98.4°F | Resp 18 | Ht 71.0 in | Wt 191.1 lb

## 2014-12-28 DIAGNOSIS — C670 Malignant neoplasm of trigone of bladder: Secondary | ICD-10-CM

## 2014-12-28 DIAGNOSIS — C679 Malignant neoplasm of bladder, unspecified: Secondary | ICD-10-CM

## 2014-12-28 DIAGNOSIS — R102 Pelvic and perineal pain: Secondary | ICD-10-CM | POA: Diagnosis not present

## 2014-12-28 DIAGNOSIS — Z5111 Encounter for antineoplastic chemotherapy: Secondary | ICD-10-CM | POA: Diagnosis not present

## 2014-12-28 DIAGNOSIS — Z95828 Presence of other vascular implants and grafts: Secondary | ICD-10-CM

## 2014-12-28 LAB — COMPREHENSIVE METABOLIC PANEL (CC13)
ALBUMIN: 2.9 g/dL — AB (ref 3.5–5.0)
ALK PHOS: 144 U/L (ref 40–150)
ALT: 31 U/L (ref 0–55)
ANION GAP: 9 meq/L (ref 3–11)
AST: 32 U/L (ref 5–34)
BILIRUBIN TOTAL: 0.87 mg/dL (ref 0.20–1.20)
BUN: 22.8 mg/dL (ref 7.0–26.0)
CHLORIDE: 102 meq/L (ref 98–109)
CO2: 22 mEq/L (ref 22–29)
Calcium: 9.4 mg/dL (ref 8.4–10.4)
Creatinine: 1.3 mg/dL (ref 0.7–1.3)
EGFR: 56 mL/min/{1.73_m2} — AB (ref >90)
GLUCOSE: 178 mg/dL — AB (ref 70–140)
POTASSIUM: 3.7 meq/L (ref 3.5–5.1)
SODIUM: 134 meq/L — AB (ref 136–145)
Total Protein: 7.2 g/dL (ref 6.4–8.3)

## 2014-12-28 LAB — CBC WITH DIFFERENTIAL/PLATELET
BASO%: 0.2 % (ref 0.0–2.0)
BASOS ABS: 0 10*3/uL (ref 0.0–0.1)
EOS%: 0.6 % (ref 0.0–7.0)
Eosinophils Absolute: 0 10*3/uL (ref 0.0–0.5)
HCT: 26 % — ABNORMAL LOW (ref 38.4–49.9)
HEMOGLOBIN: 8.3 g/dL — AB (ref 13.0–17.1)
LYMPH#: 0.8 10*3/uL — AB (ref 0.9–3.3)
LYMPH%: 16.3 % (ref 14.0–49.0)
MCH: 29 pg (ref 27.2–33.4)
MCHC: 31.9 g/dL — ABNORMAL LOW (ref 32.0–36.0)
MCV: 90.9 fL (ref 79.3–98.0)
MONO#: 0.8 10*3/uL (ref 0.1–0.9)
MONO%: 15.5 % — ABNORMAL HIGH (ref 0.0–14.0)
NEUT#: 3.3 10*3/uL (ref 1.5–6.5)
NEUT%: 67.4 % (ref 39.0–75.0)
Platelets: 219 10*3/uL (ref 140–400)
RBC: 2.86 10*6/uL — ABNORMAL LOW (ref 4.20–5.82)
RDW: 20.5 % — ABNORMAL HIGH (ref 11.0–14.6)
WBC: 4.9 10*3/uL (ref 4.0–10.3)

## 2014-12-28 MED ORDER — HEPARIN SOD (PORK) LOCK FLUSH 100 UNIT/ML IV SOLN
500.0000 [IU] | Freq: Once | INTRAVENOUS | Status: AC | PRN
  Administered 2014-12-28: 500 [IU]
  Filled 2014-12-28: qty 5

## 2014-12-28 MED ORDER — HYDROCODONE-ACETAMINOPHEN 5-325 MG PO TABS: 2.0000 | ORAL_TABLET | ORAL | Status: DC | PRN

## 2014-12-28 MED ORDER — CYANOCOBALAMIN 1000 MCG/ML IJ SOLN
INTRAMUSCULAR | Status: AC
  Filled 2014-12-28: qty 1

## 2014-12-28 MED ORDER — SODIUM CHLORIDE 0.9 % IV SOLN
Freq: Once | INTRAVENOUS | Status: AC
  Administered 2014-12-28: 11:00:00 via INTRAVENOUS
  Filled 2014-12-28: qty 4

## 2014-12-28 MED ORDER — CYANOCOBALAMIN 1000 MCG/ML IJ SOLN: 1000.0000 ug | Freq: Once | INTRAMUSCULAR | Status: DC

## 2014-12-28 MED ORDER — SODIUM CHLORIDE 0.9 % IV SOLN
1000.0000 mg | Freq: Once | INTRAVENOUS | Status: AC
  Administered 2014-12-28: 1000 mg via INTRAVENOUS
  Filled 2014-12-28: qty 40

## 2014-12-28 MED ORDER — SODIUM CHLORIDE 0.9 % IV SOLN
Freq: Once | INTRAVENOUS | Status: AC
  Administered 2014-12-28: 11:00:00 via INTRAVENOUS

## 2014-12-28 MED ORDER — SODIUM CHLORIDE 0.9 % IJ SOLN
10.0000 mL | INTRAMUSCULAR | Status: DC | PRN
  Administered 2014-12-28: 10 mL
  Filled 2014-12-28: qty 10

## 2014-12-28 MED ORDER — SODIUM CHLORIDE 0.9 % IJ SOLN
10.0000 mL | INTRAMUSCULAR | Status: DC | PRN
  Administered 2014-12-28: 10 mL via INTRAVENOUS
  Filled 2014-12-28: qty 10

## 2014-12-28 NOTE — Patient Instructions (Signed)
Follow-up in 3 weeks with a restaging CT scan as previously scheduled, prior to your next scheduled cycle of chemotherapy

## 2014-12-28 NOTE — Telephone Encounter (Signed)
Gave and printed appt sched and avs for pt for July  °

## 2014-12-28 NOTE — Progress Notes (Signed)
Hematology and Oncology Follow Up Visit  Joe Park [id number] [birth year] 74 y.o. 12/28/2014 11:15 AM   Principle Diagnosis: 74 year old man diagnosed with bladder cancer in 09/2011. Now he has recurrent advanced disease.  Prior Therapy:  1. He is S/P cystoprostatectomy and bilateral pelvic lymph node dissection and creation of an ileal conduit done on 11/11/2011. 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 The pathological stage was T3b N0.  2. Systemic chemotherapy with carboplatin for an AUC of 5 given on day 1 and gemcitabine at 1000 mg per meter square given on days 1 and 8 every 3 weeks. He is S/P 6 cycles of therapy concluded on 03/09/2014.  Secondary Diagnosis: Prostate cancer diagnosed in 2002. He is status post therapy with radiation that likely caused his bladder cancer I was diagnosed in 2013.  Current therapy:  Systemic chemotherapy with Alimta at 500 mg/m given every 3 weeks. First cycle given 08/02/2014. He is here for cycle 8.  Interim History:  [name] returns today for a follow up visit. Since the last visit, he reports no new complaints. He continues to tolerate systemic chemotherapy with Alimta without difficulty. He has not reported any nausea, vomiting or rash. He did not have any infusion-related complications. He does report more persistent  pelvic/buttock pain. He states that he is most comfortable at home in his recliner. This is currently well managed with his hydrocodone tablets. He request a refill for his hydrocodone tablets today.   He is not reporting any neurological deficits or weakness. He continues to have reasonable quality of life and continues to live independently. His appetite has been excellent.He dnies any fever chills, no nausea vomiting or night sweats. Denies cough or hemoptysis or hematemesis. Denies abdominal pain no hematochezia  or melena. Has not reported any genitourinary complaints. He declined headaches or blurry vision or double vision. Did not report any syncope or seizures. Did not report any chest pain shortness of breath or cough or hemoptysis. Did not report any hematochezia or melena. Does not report any urinary symptoms. Does not report any other musculoskeletal complaints. Did not report any arthralgias myalgias or rash or pruritus. Remainder or view of system is unremarkable.   Medications: I have reviewed the patient's current medications. Current Outpatient Prescriptions  Medication Sig Dispense Refill  . aspirin 81 MG chewable tablet Chew 81 mg by mouth daily with breakfast.     . Chlorhexidine Gluconate (PERIOGARD MT) Use as directed 0.5 oz in the mouth or throat at bedtime.     . folic acid (FOLVITE) 1 MG tablet Take 1 tablet (1 mg total) by mouth daily. 60 tablet 3  . HYDROcodone-acetaminophen (NORCO/VICODIN) 5-325 MG per tablet Take 2 tablets by mouth every 4 (four) hours as needed. 30 tablet 0  . levothyroxine (SYNTHROID, LEVOTHROID) 125 MCG tablet Take 125 mcg by mouth daily with breakfast.     . lidocaine-prilocaine (EMLA) cream Apply 1 application topically as needed. Apply approx 1/2 tsp to skin, over port-a-cath prior to chemotherapy treatments. 30 g 2  . lisinopril (PRINIVIL,ZESTRIL) 20 MG tablet Take 20 mg by mouth daily.    . Multiple Vitamin (MULITIVITAMIN WITH MINERALS) TABS Take 1 tablet by mouth every morning.    . naproxen sodium (ANAPROX) 220 MG tablet Take 660 mg by mouth 2 (two) times daily as needed (for pain.).    Marland Kitchen prochlorperazine (COMPAZINE) 10 MG tablet  Take 1 tablet (10 mg total) by mouth every 6 (six) hours as needed for nausea or vomiting. 60 tablet 1  . SENNA PO Take 1 tablet by mouth 2 times daily at 12 noon and 4 pm.    . simvastatin (ZOCOR) 40 MG tablet Take 40 mg by mouth at bedtime.    . [DISCONTINUED] hydrALAZINE (APRESOLINE) 25 MG tablet Take 12.5 mg by mouth every  morning.     No current facility-administered medications for this visit.   Facility-Administered Medications Ordered in Other Visits  Medication Dose Route Frequency Provider Last Rate Last Dose  . 0.9 %  sodium chloride infusion   Intravenous Once Wyatt Portela, MD      . cyanocobalamin ((VITAMIN B-12)) injection 1,000 mcg  1,000 mcg Intramuscular Once Wyatt Portela, MD      . heparin lock flush 100 unit/mL  500 Units Intracatheter Once PRN Wyatt Portela, MD      . ondansetron (ZOFRAN) 8 mg, dexamethasone (DECADRON) 10 mg in sodium chloride 0.9 % 50 mL IVPB   Intravenous Once Wyatt Portela, MD      . PEMEtrexed (ALIMTA) 1,050 mg in sodium chloride 0.9 % 100 mL chemo infusion  500 mg/m2 (Treatment Plan Actual) Intravenous Once Wyatt Portela, MD      . sodium chloride 0.9 % injection 10 mL  10 mL Intravenous PRN Wyatt Portela, MD   10 mL at 10/05/14 1129  . sodium chloride 0.9 % injection 10 mL  10 mL Intracatheter PRN Wyatt Portela, MD        Allergies: No Known Allergies  Past Medical History, Surgical history, Social history, and Family History were reviewed and updated.    Physical Exam: Blood pressure 142/65, pulse 100, temperature 98.4 F (36.9 C), temperature source Oral, resp. rate 18, height 5\' 11"  (1.803 m), weight 191 lb 1.6 oz (86.682 kg), SpO2 96 %. ECOG: 1 General appearance: alert awake not in any distress. Head: Atraumatic no abnormalities Neck: no adenopathy, no masses palpated. Lymph nodes: Cervical, supraclavicular, and axillary nodes normal. Heart:regular rate and rhythm, S1, S2. No murmurs rubs or gallops. Lung:chest clear, no wheezing, rales, normal symmetric air entry no dullness to percussion. Abdomen: soft, non-tender, without masses or organomegaly good bowel sounds noted. EXT:no erythema, induration, or nodules Neurological examination: No deficits at this time noted.  He has good range of motion in both hips.   Lab Results: Lab Results   Component Value Date   WBC 4.9 12/28/2014   HGB 8.3* 12/28/2014   HCT 26.0* 12/28/2014   MCV 90.9 12/28/2014   PLT 219 12/28/2014     Chemistry      Component Value Date/Time   NA 134* 12/28/2014 1001   NA 140 02/24/2012 1249   K 3.7 12/28/2014 1001   K 3.9 02/24/2012 1249   CL 106 12/27/2012 0801   CL 110 02/24/2012 1249   CO2 22 12/28/2014 1001   CO2 23 02/24/2012 1249   BUN 22.8 12/28/2014 1001   BUN 18 02/24/2012 1249   CREATININE 1.3 12/28/2014 1001   CREATININE 0.98 02/24/2012 1249      Component Value Date/Time   CALCIUM 9.4 12/28/2014 1001   CALCIUM 8.9 02/24/2012 1249   ALKPHOS 144 12/28/2014 1001   ALKPHOS 85 02/24/2012 1249   AST 32 12/28/2014 1001   AST 18 02/24/2012 1249   ALT 31 12/28/2014 1001   ALT 12 02/24/2012 1249   BILITOT 0.87 12/28/2014 1001  BILITOT 0.6 02/24/2012 1249         Impression and Plan:  74 year old with the following issue:  1.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 He developed pelvic recurrence and he is S/P 6 cycles of chemotherapy and have tolerated it well. CT scan on 07/17/2014  showed clear progression of disease.   He is now being treated with salvage chemotherapy with single agent Alimta at 500 mg/m every 3 weeks. CT scan on 10/24/2014  showed that he has local progression, disease was reasonably controlled otherwise. We have elected to continue with the same regimen for the time being.  The plan is to proceed with chemotherapy today and in 3 weeks. The plan is to repeat imaging studies in July 2016 which has been scheduled.   2. Pelvic pain: Seems to be manageable with hydrocodone and I have refilled his medication today.  3. Anti-emetics: He has a prescription for anti-emetics at this time.  4. Anemia: His hemoglobin appear adequate at this time. He is asymptomatic at his current level of anemia, which is relatively stable.  5. IV access: Port-A-Cath in place  and will be used for systemic chemotherapy.  6. Vitamin supplementation: He will continue on folic acid today 1 mg daily and we will continue vitamin B12 injections with every third cycle of Alimta.   7. Followup: In 3 weeks for the next cycle of chemotherapy.  [name] , PA-C  6/17/201611:15 AM

## 2014-12-28 NOTE — Patient Instructions (Signed)
[facility] Discharge Instructions for Patients Receiving Chemotherapy  Today you received the following chemotherapy agents alimta  To help prevent nausea and vomiting after your treatment, we encourage you to take your nausea medication as directed  If you develop nausea and vomiting that is not controlled by your nausea medication, call the clinic.   BELOW ARE SYMPTOMS THAT SHOULD BE REPORTED IMMEDIATELY:  *FEVER GREATER THAN 100.5 F  *CHILLS WITH OR WITHOUT FEVER  NAUSEA AND VOMITING THAT IS NOT CONTROLLED WITH YOUR NAUSEA MEDICATION  *UNUSUAL SHORTNESS OF BREATH  *UNUSUAL BRUISING OR BLEEDING  TENDERNESS IN MOUTH AND THROAT WITH OR WITHOUT PRESENCE OF ULCERS  *URINARY PROBLEMS  *BOWEL PROBLEMS  UNUSUAL RASH Items with * indicate a potential emergency and should be followed up as soon as possible.  Feel free to call the clinic you have any questions or concerns. The clinic phone number is [phone].  

## 2014-12-28 NOTE — Patient Instructions (Signed)

## 2015-01-08 ENCOUNTER — Inpatient Hospital Stay (HOSPITAL_COMMUNITY)
Admission: EM | Admit: 2015-01-08 | Discharge: 2015-01-11 | DRG: 812 | Disposition: A | Discharge status: Hospice | Payer: PPO | Attending: Internal Medicine | Admitting: Internal Medicine

## 2015-01-08 ENCOUNTER — Telehealth: Payer: Self-pay | Admitting: *Deleted

## 2015-01-08 ENCOUNTER — Encounter (HOSPITAL_COMMUNITY): Payer: Self-pay | Admitting: Emergency Medicine

## 2015-01-08 DIAGNOSIS — E039 Hypothyroidism, unspecified: Secondary | ICD-10-CM | POA: Diagnosis present

## 2015-01-08 DIAGNOSIS — C786 Secondary malignant neoplasm of retroperitoneum and peritoneum: Secondary | ICD-10-CM | POA: Diagnosis present

## 2015-01-08 DIAGNOSIS — I959 Hypotension, unspecified: Secondary | ICD-10-CM | POA: Diagnosis present

## 2015-01-08 DIAGNOSIS — Z8546 Personal history of malignant neoplasm of prostate: Secondary | ICD-10-CM | POA: Diagnosis not present

## 2015-01-08 DIAGNOSIS — Z923 Personal history of irradiation: Secondary | ICD-10-CM | POA: Diagnosis not present

## 2015-01-08 DIAGNOSIS — I82409 Acute embolism and thrombosis of unspecified deep veins of unspecified lower extremity: Secondary | ICD-10-CM

## 2015-01-08 DIAGNOSIS — M25559 Pain in unspecified hip: Secondary | ICD-10-CM | POA: Insufficient documentation

## 2015-01-08 DIAGNOSIS — G4730 Sleep apnea, unspecified: Secondary | ICD-10-CM | POA: Diagnosis present

## 2015-01-08 DIAGNOSIS — N132 Hydronephrosis with renal and ureteral calculous obstruction: Secondary | ICD-10-CM | POA: Diagnosis present

## 2015-01-08 DIAGNOSIS — C670 Malignant neoplasm of trigone of bladder: Secondary | ICD-10-CM

## 2015-01-08 DIAGNOSIS — R600 Localized edema: Secondary | ICD-10-CM | POA: Diagnosis not present

## 2015-01-08 DIAGNOSIS — Z79899 Other long term (current) drug therapy: Secondary | ICD-10-CM | POA: Diagnosis not present

## 2015-01-08 DIAGNOSIS — Z932 Ileostomy status: Secondary | ICD-10-CM | POA: Diagnosis not present

## 2015-01-08 DIAGNOSIS — R195 Other fecal abnormalities: Secondary | ICD-10-CM | POA: Diagnosis present

## 2015-01-08 DIAGNOSIS — D6481 Anemia due to antineoplastic chemotherapy: Principal | ICD-10-CM | POA: Diagnosis present

## 2015-01-08 DIAGNOSIS — C679 Malignant neoplasm of bladder, unspecified: Secondary | ICD-10-CM | POA: Diagnosis present

## 2015-01-08 DIAGNOSIS — N179 Acute kidney failure, unspecified: Secondary | ICD-10-CM | POA: Diagnosis present

## 2015-01-08 DIAGNOSIS — Z515 Encounter for palliative care: Secondary | ICD-10-CM

## 2015-01-08 DIAGNOSIS — I4891 Unspecified atrial fibrillation: Secondary | ICD-10-CM | POA: Diagnosis present

## 2015-01-08 DIAGNOSIS — R627 Adult failure to thrive: Secondary | ICD-10-CM | POA: Diagnosis present

## 2015-01-08 DIAGNOSIS — M79651 Pain in right thigh: Secondary | ICD-10-CM | POA: Diagnosis not present

## 2015-01-08 DIAGNOSIS — D696 Thrombocytopenia, unspecified: Secondary | ICD-10-CM | POA: Diagnosis present

## 2015-01-08 DIAGNOSIS — G893 Neoplasm related pain (acute) (chronic): Secondary | ICD-10-CM | POA: Diagnosis not present

## 2015-01-08 DIAGNOSIS — I10 Essential (primary) hypertension: Secondary | ICD-10-CM | POA: Diagnosis present

## 2015-01-08 DIAGNOSIS — D6959 Other secondary thrombocytopenia: Secondary | ICD-10-CM | POA: Diagnosis present

## 2015-01-08 DIAGNOSIS — R479 Unspecified speech disturbances: Secondary | ICD-10-CM | POA: Diagnosis present

## 2015-01-08 DIAGNOSIS — D649 Anemia, unspecified: Secondary | ICD-10-CM

## 2015-01-08 DIAGNOSIS — R531 Weakness: Secondary | ICD-10-CM | POA: Insufficient documentation

## 2015-01-08 DIAGNOSIS — E860 Dehydration: Secondary | ICD-10-CM | POA: Diagnosis present

## 2015-01-08 DIAGNOSIS — E780 Pure hypercholesterolemia: Secondary | ICD-10-CM | POA: Diagnosis present

## 2015-01-08 DIAGNOSIS — Z66 Do not resuscitate: Secondary | ICD-10-CM | POA: Diagnosis present

## 2015-01-08 DIAGNOSIS — K219 Gastro-esophageal reflux disease without esophagitis: Secondary | ICD-10-CM | POA: Diagnosis present

## 2015-01-08 DIAGNOSIS — C801 Malignant (primary) neoplasm, unspecified: Secondary | ICD-10-CM

## 2015-01-08 DIAGNOSIS — R197 Diarrhea, unspecified: Secondary | ICD-10-CM

## 2015-01-08 DIAGNOSIS — C7989 Secondary malignant neoplasm of other specified sites: Secondary | ICD-10-CM

## 2015-01-08 DIAGNOSIS — M25551 Pain in right hip: Secondary | ICD-10-CM | POA: Diagnosis not present

## 2015-01-08 DIAGNOSIS — T451X5A Adverse effect of antineoplastic and immunosuppressive drugs, initial encounter: Secondary | ICD-10-CM | POA: Diagnosis present

## 2015-01-08 DIAGNOSIS — I9589 Other hypotension: Secondary | ICD-10-CM | POA: Diagnosis not present

## 2015-01-08 DIAGNOSIS — Z7982 Long term (current) use of aspirin: Secondary | ICD-10-CM

## 2015-01-08 DIAGNOSIS — Z936 Other artificial openings of urinary tract status: Secondary | ICD-10-CM

## 2015-01-08 LAB — CBC WITH DIFFERENTIAL/PLATELET
Basophils Absolute: 0 10*3/uL (ref 0.0–0.1)
Basophils Relative: 0 % (ref 0–1)
Eosinophils Absolute: 0 10*3/uL (ref 0.0–0.7)
Eosinophils Relative: 0 % (ref 0–5)
HCT: 19.5 % — ABNORMAL LOW (ref 39.0–52.0)
Hemoglobin: 6.5 g/dL — CL (ref 13.0–17.0)
Lymphocytes Relative: 6 % — ABNORMAL LOW (ref 12–46)
Lymphs Abs: 0.3 10*3/uL — ABNORMAL LOW (ref 0.7–4.0)
MCH: 28.6 pg (ref 26.0–34.0)
MCHC: 33.3 g/dL (ref 30.0–36.0)
MCV: 85.9 fL (ref 78.0–100.0)
Monocytes Absolute: 0.7 10*3/uL (ref 0.1–1.0)
Monocytes Relative: 15 % — ABNORMAL HIGH (ref 3–12)
Neutro Abs: 3.7 10*3/uL (ref 1.7–7.7)
Neutrophils Relative %: 79 % — ABNORMAL HIGH (ref 43–77)
Platelets: 52 10*3/uL — ABNORMAL LOW (ref 150–400)
RBC: 2.27 MIL/uL — ABNORMAL LOW (ref 4.22–5.81)
RDW: 20 % — ABNORMAL HIGH (ref 11.5–15.5)
WBC: 4.7 10*3/uL (ref 4.0–10.5)

## 2015-01-08 LAB — COMPREHENSIVE METABOLIC PANEL
ALT: 77 U/L — ABNORMAL HIGH (ref 17–63)
AST: 96 U/L — ABNORMAL HIGH (ref 15–41)
Albumin: 2.5 g/dL — ABNORMAL LOW (ref 3.5–5.0)
Alkaline Phosphatase: 180 U/L — ABNORMAL HIGH (ref 38–126)
Anion gap: 11 (ref 5–15)
BUN: 64 mg/dL — ABNORMAL HIGH (ref 6–20)
CO2: 19 mmol/L — ABNORMAL LOW (ref 22–32)
Calcium: 10.2 mg/dL (ref 8.9–10.3)
Chloride: 100 mmol/L — ABNORMAL LOW (ref 101–111)
Creatinine, Ser: 2.58 mg/dL — ABNORMAL HIGH (ref 0.61–1.24)
GFR calc Af Amer: 27 mL/min — ABNORMAL LOW (ref >60)
GFR calc non Af Amer: 23 mL/min — ABNORMAL LOW (ref >60)
Glucose, Bld: 126 mg/dL — ABNORMAL HIGH (ref 65–99)
Potassium: 3.8 mmol/L (ref 3.5–5.1)
Sodium: 130 mmol/L — ABNORMAL LOW (ref 135–145)
Total Bilirubin: 0.9 mg/dL (ref 0.3–1.2)
Total Protein: 6.8 g/dL (ref 6.5–8.1)

## 2015-01-08 LAB — URINALYSIS, ROUTINE W REFLEX MICROSCOPIC
Bilirubin Urine: NEGATIVE
Glucose, UA: NEGATIVE mg/dL
Ketones, ur: NEGATIVE mg/dL
Nitrite: NEGATIVE
Protein, ur: 100 mg/dL — AB
Specific Gravity, Urine: 1.012 (ref 1.005–1.030)
Urobilinogen, UA: 0.2 mg/dL (ref 0.0–1.0)
pH: 8 (ref 5.0–8.0)

## 2015-01-08 LAB — PREPARE RBC (CROSSMATCH)

## 2015-01-08 LAB — URINE MICROSCOPIC-ADD ON

## 2015-01-08 LAB — POC OCCULT BLOOD, ED: Fecal Occult Bld: POSITIVE — AB

## 2015-01-08 MED ORDER — SODIUM CHLORIDE 0.9 % IV SOLN
80.0000 mg | Freq: Two times a day (BID) | INTRAVENOUS | Status: DC
Start: 2015-01-08 — End: 2015-01-09
  Administered 2015-01-08 – 2015-01-09 (×2): 80 mg via INTRAVENOUS
  Filled 2015-01-08 (×4): qty 80

## 2015-01-08 MED ORDER — HYDROCODONE-ACETAMINOPHEN 5-325 MG PO TABS
2.0000 | ORAL_TABLET | ORAL | Status: DC | PRN
  Administered 2015-01-08: 2 via ORAL
  Filled 2015-01-08: qty 2

## 2015-01-08 MED ORDER — BOOST / RESOURCE BREEZE PO LIQD
1.0000 | Freq: Three times a day (TID) | ORAL | Status: DC
  Administered 2015-01-09 – 2015-01-11 (×5): 1 via ORAL
  Filled 2015-01-08: qty 1

## 2015-01-08 MED ORDER — FOLIC ACID 1 MG PO TABS
1.0000 mg | ORAL_TABLET | Freq: Every day | ORAL | Status: DC
  Administered 2015-01-08 – 2015-01-11 (×4): 1 mg via ORAL
  Filled 2015-01-08 (×4): qty 1

## 2015-01-08 MED ORDER — SODIUM CHLORIDE 0.9 % IV BOLUS (SEPSIS)
1000.0000 mL | Freq: Once | INTRAVENOUS | Status: AC
  Administered 2015-01-08: 1000 mL via INTRAVENOUS

## 2015-01-08 MED ORDER — SIMVASTATIN 40 MG PO TABS
40.0000 mg | ORAL_TABLET | Freq: Every day | ORAL | Status: DC
  Administered 2015-01-08 – 2015-01-09 (×2): 40 mg via ORAL
  Filled 2015-01-08 (×3): qty 1

## 2015-01-08 MED ORDER — LEVOTHYROXINE SODIUM 25 MCG PO TABS
125.0000 ug | ORAL_TABLET | Freq: Every day | ORAL | Status: DC
  Administered 2015-01-09 – 2015-01-11 (×3): 125 ug via ORAL
  Filled 2015-01-08 (×5): qty 1

## 2015-01-08 MED ORDER — SODIUM CHLORIDE 0.9 % IV SOLN
10.0000 mL/h | Freq: Once | INTRAVENOUS | Status: AC
Start: 2015-01-08 — End: 2015-01-08
  Administered 2015-01-08: 10 mL/h via INTRAVENOUS

## 2015-01-08 MED ORDER — PROCHLORPERAZINE MALEATE 10 MG PO TABS
10.0000 mg | ORAL_TABLET | Freq: Four times a day (QID) | ORAL | Status: DC | PRN
  Filled 2015-01-08: qty 1

## 2015-01-08 NOTE — Plan of Care (Signed)
Problem: Phase I Progression Outcomes Goal: Voiding-avoid urinary catheter unless indicated Outcome: Not Applicable Date Met:  02/98/47 Pt has ileal conduit

## 2015-01-08 NOTE — ED Notes (Signed)
Unable to collect labs at this time nurse tech working with patient. 

## 2015-01-08 NOTE — ED Notes (Signed)
Bed: [facility] Expected date:  Expected time:  Means of arrival:  Comments: EMS-weak

## 2015-01-08 NOTE — ED Provider Notes (Signed)
CSN: [id number]     Arrival date & time 01/08/15  1544 History   First MD Initiated Contact with Patient 01/08/15 1557     Chief Complaint  Patient presents with  . Fatigue  . Diarrhea   HPI   59 YOM presents with 1 day of diarrhea and fatigue. He is S/P cystoprostatectomy and bilateral pelvic lymph node dissection and creation of an ileal conduit done on 11/11/2011. Pt receives systemic chemotherapy with Alimta at 500 mg/m given every 3 weeks. First cycle given 08/02/2014, most recent on 12/28/2014.  Pt reports two days ago while at church he began to feel fatigued with diarrhea beginning later that evening. He reports approx. 4 per day. Pt denies, recent hospitalization, antibiotic use, sick contacts, or exposure to abnoramal food or drink.  Pt describes the diarrhea as watery with no blood. He notes bowel incontinence today. Pt denies fever, chills, N/V, chest pain, abdominal pain, lower extremity swelling or edema. He denies focal neurological deficits or weakness but endorses genaralized weakness and inability to ambulate due to weakness. Patient normally ambulatory without difficulty at baseline. She lives at home, his brother checks on him once a day. He came to the house today noticed that he was extremely fatigued and unable to ambulate.    Past Medical History  Diagnosis Date  . Hypertension   . Elevated cholesterol   . GERD (gastroesophageal reflux disease)   . Bladder tumor   . Difficulty urinating   . Hypothyroidism     UNSURE IF TOO LOW OR TOO HIGH  . Difficulty sleeping   . Speech impediment     SINCE BIRTH  . Sleep apnea     STOP BANG SCORE 5  . Cancer     HX PROSTATE CANCER AND BLADDER CANCER   Past Surgical History  Procedure Laterality Date  . Cataracts  2012    RT CATARACT REMOVED  . Radioactive seed implant  1990'S    PROSTATE CANCER  . Hernia repair  1980'S  . Transurethral resection of bladder tumor  09/22/2011    Procedure: TRANSURETHRAL RESECTION OF BLADDER  TUMOR (TURBT);  Surgeon: Malka So, MD;  Location: [facility];  Service: Urology;  Laterality: N/A;  Examination Under Anesthesia/TUR-BT with Gyrus  . Cystectomy  11/11/2011    Procedure: CYSTECTOMY COMPLETE;  Surgeon: Franchot Gallo, MD;  Location: [facility];  Service: Urology;  Laterality: N/A;  RADICAL CYSTECTOMY WITH NODE DISSECTION AND ILEAL CONDUIT   . Lymph node dissection  11/11/2011    Procedure: LYMPH NODE DISSECTION;  Surgeon: Franchot Gallo, MD;  Location: [facility];  Service: Urology;  Laterality: N/A;   No family history on file. History  Substance Use Topics  . Smoking status: Never Smoker   . Smokeless tobacco: Never Used  . Alcohol Use: No    Review of Systems  All other systems reviewed and are negative.   Allergies  Review of patient's allergies indicates no known allergies.  Home Medications   Prior to Admission medications   Medication Sig Start Date End Date Taking? Authorizing Provider  aspirin 81 MG chewable tablet Chew 81 mg by mouth daily with breakfast.    Yes Historical Provider, MD  folic acid (FOLVITE) 1 MG tablet Take 1 tablet (1 mg total) by mouth daily. 07/19/14  Yes Wyatt Portela, MD  HYDROcodone-acetaminophen (NORCO/VICODIN) 5-325 MG per tablet Take 2 tablets by mouth every 4 (four) hours as needed. Patient taking differently: Take 2 tablets by mouth every 4 (  four) hours as needed for moderate pain.  12/28/14  Yes Adrena E Johnson, PA-C  levothyroxine (SYNTHROID, LEVOTHROID) 125 MCG tablet Take 125 mcg by mouth daily with breakfast.    Yes Historical Provider, MD  lidocaine-prilocaine (EMLA) cream Apply 1 application topically as needed. Apply approx 1/2 tsp to skin, over port-a-cath prior to chemotherapy treatments. 12/29/13  Yes Wyatt Portela, MD  Multiple Vitamin (MULITIVITAMIN WITH MINERALS) TABS Take 1 tablet by mouth every morning.   Yes Historical Provider, MD  naproxen sodium (ANAPROX) 220 MG tablet Take 660 mg by mouth 2 (two) times daily as  needed (for pain.).   Yes Historical Provider, MD  prochlorperazine (COMPAZINE) 10 MG tablet Take 1 tablet (10 mg total) by mouth every 6 (six) hours as needed for nausea or vomiting. 10/13/13  Yes Maryanna Shape, NP  SENNA PO Take 1 tablet by mouth 2 times daily at 12 noon and 4 pm.   Yes Historical Provider, MD  simvastatin (ZOCOR) 40 MG tablet Take 40 mg by mouth at bedtime.   Yes Historical Provider, MD  lisinopril (PRINIVIL,ZESTRIL) 20 MG tablet Take 20 mg by mouth daily. 06/30/14   Historical Provider, MD   BP 122/49 mmHg  Pulse 89  Temp(Src) 98.6 F (37 C) (Oral)  Resp 25  Ht 6' (1.829 m)  Wt 189 lb 13.1 oz (86.1 kg)  BMI 25.74 kg/m2  SpO2 96% Physical Exam  Constitutional: He is oriented to person, place, and time. He appears well-developed and well-nourished.  HENT:  Head: Normocephalic and atraumatic.  Eyes: Conjunctivae are normal. Pupils are equal, round, and reactive to light. Right eye exhibits no discharge. Left eye exhibits no discharge. No scleral icterus.  Neck: Normal range of motion. No JVD present. No tracheal deviation present.  Cardiovascular: Regular rhythm and normal heart sounds.  Exam reveals no gallop and no friction rub.   No murmur heard. Pulmonary/Chest: Effort normal and breath sounds normal. No stridor. No respiratory distress. He has no wheezes. He has no rales. He exhibits no tenderness.  Abdominal: Soft. He exhibits no distension and no mass. There is no tenderness. There is no rebound and no guarding.  Ileal conduit with no sings of surrounding infection.  Neurological: He is alert and oriented to person, place, and time. Coordination normal.  Skin: Skin is warm and dry.  Psychiatric: He has a normal mood and affect. His behavior is normal. Judgment and thought content normal.  Nursing note and vitals reviewed.   ED Course  Procedures (including critical care time) Labs Review Labs Reviewed  CBC WITH DIFFERENTIAL/PLATELET - Abnormal; Notable  for the following:    RBC 2.27 (*)    Hemoglobin 6.5 (*)    HCT 19.5 (*)    RDW 20.0 (*)    Platelets 52 (*)    Neutrophils Relative % 79 (*)    Lymphocytes Relative 6 (*)    Monocytes Relative 15 (*)    Lymphs Abs 0.3 (*)    All other components within normal limits  COMPREHENSIVE METABOLIC PANEL - Abnormal; Notable for the following:    Sodium 130 (*)    Chloride 100 (*)    CO2 19 (*)    Glucose, Bld 126 (*)    BUN 64 (*)    Creatinine, Ser 2.58 (*)    Albumin 2.5 (*)    AST 96 (*)    ALT 77 (*)    Alkaline Phosphatase 180 (*)    GFR calc non Af Wyvonnia Lora  23 (*)    GFR calc Af Amer 27 (*)    All other components within normal limits  URINALYSIS, ROUTINE W REFLEX MICROSCOPIC (NOT AT [facility]) - Abnormal; Notable for the following:    Color, Urine AMBER (*)    APPearance TURBID (*)    Hgb urine dipstick LARGE (*)    Protein, ur 100 (*)    Leukocytes, UA MODERATE (*)    All other components within normal limits  URINE MICROSCOPIC-ADD ON - Abnormal; Notable for the following:    Bacteria, UA MANY (*)    Crystals TRIPLE PHOSPHATE CRYSTALS (*)    All other components within normal limits  CBC - Abnormal; Notable for the following:    RBC 2.47 (*)    Hemoglobin 7.2 (*)    HCT 21.2 (*)    RDW 19.4 (*)    Platelets 41 (*)    All other components within normal limits  BASIC METABOLIC PANEL - Abnormal; Notable for the following:    Sodium 132 (*)    CO2 19 (*)    BUN 63 (*)    Creatinine, Ser 2.36 (*)    GFR calc non Af Amer 26 (*)    GFR calc Af Amer 30 (*)    All other components within normal limits  CBC - Abnormal; Notable for the following:    RBC 2.44 (*)    Hemoglobin 7.2 (*)    HCT 21.0 (*)    RDW 19.3 (*)    Platelets 50 (*)    All other components within normal limits  BASIC METABOLIC PANEL - Abnormal; Notable for the following:    Sodium 131 (*)    Potassium 3.3 (*)    CO2 16 (*)    Glucose, Bld 121 (*)    BUN 46 (*)    Creatinine, Ser 2.15 (*)    GFR calc  non Af Amer 29 (*)    GFR calc Af Amer 33 (*)    All other components within normal limits  POC OCCULT BLOOD, ED - Abnormal; Notable for the following:    Fecal Occult Bld POSITIVE (*)    All other components within normal limits  TYPE AND SCREEN  PREPARE RBC (CROSSMATCH)    Imaging Review    EKG Interpretation None      MDM   Final diagnoses:  Anemia, unspecified anemia type  Thrombocytopenia  Acute kidney injury  Diarrhea    Labs: CBC, CMP, urinalysis, POC occult blood- Positive for occult blood,  hemoglobin 6.5 platelets 52, sodium 1:30 creatinine at 2.58  Imaging: none  Consults: Hospitalist  Therapeutics: Normal saline   Plan: Patient presents with anemia, thrombocytopenia, acute kidney injury, diarrhea. Previous creatinine show no significant findings, due to patient's diarrhea this could likely represent prerenal. Uncertain etiology of the thrombocytopenia and anemia. No signs of active bleed, although point of care occult blood was positive. Patient remained stable throughout his stay in the ED, hospitalist consult at and agreed to admit.      Okey Regal, PA-C 01/10/15 1416

## 2015-01-08 NOTE — ED Notes (Signed)
Per EMS pt comes from home for weakness since Sunday.  Pt also has diarrhea with incontinence today.

## 2015-01-08 NOTE — ED Provider Notes (Signed)
Medical screening examination/treatment/procedure(s) were conducted as a shared visit with non-physician practitioner(s) and myself.  I personally evaluated the patient during the encounter.   EKG Interpretation None     Patient here with weakness since Sunday and dark stools. Blood work consistent with anemia as well as he has guaiac positive stools. Blood pressure treated with IV fluids and transfusion of packed red blood cells ordered. He will be admitted to medicine  Lacretia Leigh, MD 01/08/15 [phone]

## 2015-01-08 NOTE — Progress Notes (Signed)
Utilization Review completed.  Caydan Mctavish RN CM  

## 2015-01-08 NOTE — H&P (Signed)
[facility] History and Physical  Joe Park Joe Park DOB: 1940-07-22 DOA: 01/08/2015  Referring physician: EDP PCP: Donnajean Lopes, MD   Chief Complaint: Fatigue, diarrhea   HPI: Joe Park is a 74 y.o. male with h/o prostate cancer in the past, treated with radiation by seed implant.  Radiation therapy later developed bladder cancer stage III resected in 2013 with Ileal conduit.  On chemotherapy with Alimta currently last dose on 6/17.  Patient presents to the ED with c/o diarrhea and fatigue.  Symptoms onset Sunday, worsening since then.  Nothing makes symptoms better or worse.  Stool has no frank blood or melena but is slightly darker than baseline.  Is on ASA 81 and NSAIDS.  Unable to ambulate due to weakness.  Review of Systems: Systems reviewed.  As above, otherwise negative  Past Medical History  Diagnosis Date  . Hypertension   . Elevated cholesterol   . GERD (gastroesophageal reflux disease)   . Bladder tumor   . Difficulty urinating   . Hypothyroidism     UNSURE IF TOO LOW OR TOO HIGH  . Difficulty sleeping   . Speech impediment     SINCE BIRTH  . Sleep apnea     STOP BANG SCORE 5  . Cancer     HX PROSTATE CANCER AND BLADDER CANCER   Past Surgical History  Procedure Laterality Date  . Cataracts  2012    RT CATARACT REMOVED  . Radioactive seed implant  1990'S    PROSTATE CANCER  . Hernia repair  1980'S  . Transurethral resection of bladder tumor  09/22/2011    Procedure: TRANSURETHRAL RESECTION OF BLADDER TUMOR (TURBT);  Surgeon: Malka So, MD;  Location: [facility];  Service: Urology;  Laterality: N/A;  Examination Under Anesthesia/TUR-BT with Gyrus  . Cystectomy  11/11/2011    Procedure: CYSTECTOMY COMPLETE;  Surgeon: Franchot Gallo, MD;  Location: [facility];  Service: Urology;  Laterality: N/A;  RADICAL CYSTECTOMY WITH NODE DISSECTION AND ILEAL CONDUIT   . Lymph node dissection  11/11/2011    Procedure: LYMPH NODE DISSECTION;  Surgeon:  Franchot Gallo, MD;  Location: [facility];  Service: Urology;  Laterality: N/A;   Social History:  reports that he has never smoked. He has never used smokeless tobacco. He reports that he does not drink alcohol or use illicit drugs.  No Known Allergies  No family history on file.   Prior to Admission medications   Medication Sig Start Date End Date Taking? Authorizing Provider  aspirin 81 MG chewable tablet Chew 81 mg by mouth daily with breakfast.    Yes Historical Provider, MD  folic acid (FOLVITE) 1 MG tablet Take 1 tablet (1 mg total) by mouth daily. 07/19/14  Yes Wyatt Portela, MD  HYDROcodone-acetaminophen (NORCO/VICODIN) 5-325 MG per tablet Take 2 tablets by mouth every 4 (four) hours as needed. Patient taking differently: Take 2 tablets by mouth every 4 (four) hours as needed for moderate pain.  12/28/14  Yes Adrena E Johnson, PA-C  levothyroxine (SYNTHROID, LEVOTHROID) 125 MCG tablet Take 125 mcg by mouth daily with breakfast.    Yes Historical Provider, MD  lidocaine-prilocaine (EMLA) cream Apply 1 application topically as needed. Apply approx 1/2 tsp to skin, over port-a-cath prior to chemotherapy treatments. 12/29/13  Yes Wyatt Portela, MD  Multiple Vitamin (MULITIVITAMIN WITH MINERALS) TABS Take 1 tablet by mouth every morning.   Yes Historical Provider, MD  naproxen sodium (ANAPROX) 220 MG tablet Take 660 mg by mouth 2 (  two) times daily as needed (for pain.).   Yes Historical Provider, MD  prochlorperazine (COMPAZINE) 10 MG tablet Take 1 tablet (10 mg total) by mouth every 6 (six) hours as needed for nausea or vomiting. 10/13/13  Yes Maryanna Shape, NP  SENNA PO Take 1 tablet by mouth 2 times daily at 12 noon and 4 pm.   Yes Historical Provider, MD  simvastatin (ZOCOR) 40 MG tablet Take 40 mg by mouth at bedtime.   Yes Historical Provider, MD  lisinopril (PRINIVIL,ZESTRIL) 20 MG tablet Take 20 mg by mouth daily. 06/30/14   Historical Provider, MD   Physical Exam: Filed Vitals:    01/08/15 1851  BP: 94/57  Pulse: 83  Temp:   Resp: 18    BP 94/57 mmHg  Pulse 83  Temp(Src) 98 F (36.7 C) (Oral)  Resp 18  SpO2 93%  General Appearance:    Alert, oriented, no distress, appears stated age  Head:    Normocephalic, atraumatic  Eyes:    PERRL, EOMI, sclera non-icteric        Nose:   Nares without drainage or epistaxis. Mucosa, turbinates normal  Throat:   Moist mucous membranes. Oropharynx without erythema or exudate.  Neck:   Supple. No carotid bruits.  No thyromegaly.  No lymphadenopathy.   Back:     No CVA tenderness, no spinal tenderness  Lungs:     Clear to auscultation bilaterally, without wheezes, rhonchi or rales  Chest wall:    No tenderness to palpitation  Heart:    Regular rate and rhythm without murmurs, gallops, rubs  Abdomen:     Soft, non-tender, nondistended, normal bowel sounds, no organomegaly  Genitalia:    deferred  Rectal:    deferred  Extremities:   No clubbing, cyanosis or edema.  Pulses:   2+ and symmetric all extremities  Skin:   Skin color, texture, turgor normal, no rashes or lesions  Lymph nodes:   Cervical, supraclavicular, and axillary nodes normal  Neurologic:   CNII-XII intact. Normal strength, sensation and reflexes      throughout    Labs on Admission:  Basic Metabolic Panel:  Recent Labs Lab 01/08/15 1706  NA 130*  K 3.8  CL 100*  CO2 19*  GLUCOSE 126*  BUN 64*  CREATININE 2.58*  CALCIUM 10.2   Liver Function Tests:  Recent Labs Lab 01/08/15 1706  AST 96*  ALT 77*  ALKPHOS 180*  BILITOT 0.9  PROT 6.8  ALBUMIN 2.5*   No results for input(s): LIPASE, AMYLASE in the last 168 hours. No results for input(s): AMMONIA in the last 168 hours. CBC:  Recent Labs Lab 01/08/15 1706  WBC 4.7  NEUTROABS 3.7  HGB 6.5*  HCT 19.5*  MCV 85.9  PLT 52*   Cardiac Enzymes: No results for input(s): CKTOTAL, CKMB, CKMBINDEX, TROPONINI in the last 168 hours.  BNP (last 3 results) No results for input(s): PROBNP  in the last 8760 hours. CBG: No results for input(s): GLUCAP in the last 168 hours.  Radiological Exams on Admission: No results found.  EKG: Independently reviewed.  Assessment/Plan Principal Problem:   Anemia Active Problems:   Hypotension   Status post ileal conduit   Bladder cancer   H/O prostate cancer   AKI (acute kidney injury)   Thrombocytopenia   Occult blood in stools   1. Anemia - symptomatic 1. Transfusing 2 units PRBC 2. Recheck CBC in AM 2. Hypotension - pre-renal due to dehydration 1. Got IVF in  ED 2. Getting PRBC as additional volume 3. Hold lisinopril 3. Thrombocytopenia - likely BMS from chemotherapy 1. Oncology consult put in computer for AM 2. Double checked with Lab who checked the smear a second time, there are no schistiocytes on smear to suggest TTP at this point. 4. AKI - likely pre-renal due to #2 1. Strict intake and output 2. Repeat BMP in AM 3. Hold lisinopril 5. Bladder cancer - oncology to follow in AM, consult put in epic per routine 6. Occult blood in stool - 1. Stopping NSAIDS and ASA 2. No frank melena or large volume GIB at this point 3. Likely needs GI consult in AM, DDX includes NSAID induced gastric ulcer vs radiation colitis. 4. Protonix 80mg  IV q12h ordered    Code Status: Full Code  Family Communication: No family in room Disposition Plan: Admit to inpatient   Time spent: 70 min  Joe Park, Joe M. [facility] Pager [phone]  If 7AM-7PM, please contact the day team taking care of the patient [email or web address] Password [facility] 01/08/2015, 8:12 PM

## 2015-01-08 NOTE — Telephone Encounter (Signed)
VM message from pt's sister, Zigmund Daniel. She states she has been to see her brother today and finds him to be much weaker, not eating or drinking as much and pretty much staying in his recliner. He is too weak to get up much, has had diarrhea x 4 over the weekend. Pt lives alone and has only elderly sister/brother-in-law to help care for him.  Zigmund Daniel thinks he is getting dehydrated and can't care for himself well. Donzetta Sprung that if she thought he was declining, dehydrated and needing medical evaluation and care that she should have EMS transport him to the ED. Zigmund Daniel agreed and will pursue this today.  Will make Dr. Alen Blew aware of situation.

## 2015-01-09 ENCOUNTER — Inpatient Hospital Stay (HOSPITAL_COMMUNITY): Payer: PPO

## 2015-01-09 DIAGNOSIS — G893 Neoplasm related pain (acute) (chronic): Secondary | ICD-10-CM

## 2015-01-09 DIAGNOSIS — D696 Thrombocytopenia, unspecified: Secondary | ICD-10-CM

## 2015-01-09 DIAGNOSIS — N179 Acute kidney failure, unspecified: Secondary | ICD-10-CM

## 2015-01-09 DIAGNOSIS — I82409 Acute embolism and thrombosis of unspecified deep veins of unspecified lower extremity: Secondary | ICD-10-CM

## 2015-01-09 DIAGNOSIS — R609 Edema, unspecified: Secondary | ICD-10-CM

## 2015-01-09 DIAGNOSIS — C679 Malignant neoplasm of bladder, unspecified: Secondary | ICD-10-CM

## 2015-01-09 DIAGNOSIS — D649 Anemia, unspecified: Secondary | ICD-10-CM

## 2015-01-09 DIAGNOSIS — I9589 Other hypotension: Secondary | ICD-10-CM

## 2015-01-09 LAB — BASIC METABOLIC PANEL
Anion gap: 8 (ref 5–15)
BUN: 63 mg/dL — AB (ref 6–20)
CHLORIDE: 105 mmol/L (ref 101–111)
CO2: 19 mmol/L — AB (ref 22–32)
Calcium: 9.4 mg/dL (ref 8.9–10.3)
Creatinine, Ser: 2.36 mg/dL — ABNORMAL HIGH (ref 0.61–1.24)
GFR calc Af Amer: 30 mL/min — ABNORMAL LOW (ref >60)
GFR calc non Af Amer: 26 mL/min — ABNORMAL LOW (ref >60)
GLUCOSE: 93 mg/dL (ref 65–99)
Potassium: 3.6 mmol/L (ref 3.5–5.1)
Sodium: 132 mmol/L — ABNORMAL LOW (ref 135–145)

## 2015-01-09 LAB — CBC
HCT: 21.2 % — ABNORMAL LOW (ref 39.0–52.0)
Hemoglobin: 7.2 g/dL — ABNORMAL LOW (ref 13.0–17.0)
MCH: 29.1 pg (ref 26.0–34.0)
MCHC: 34 g/dL (ref 30.0–36.0)
MCV: 85.8 fL (ref 78.0–100.0)
Platelets: 41 10*3/uL — ABNORMAL LOW (ref 150–400)
RBC: 2.47 MIL/uL — ABNORMAL LOW (ref 4.22–5.81)
RDW: 19.4 % — ABNORMAL HIGH (ref 11.5–15.5)
WBC: 4.8 10*3/uL (ref 4.0–10.5)

## 2015-01-09 MED ORDER — PANTOPRAZOLE SODIUM 40 MG PO TBEC
40.0000 mg | DELAYED_RELEASE_TABLET | Freq: Every day | ORAL | Status: DC
  Administered 2015-01-10 – 2015-01-11 (×2): 40 mg via ORAL
  Filled 2015-01-09 (×2): qty 1

## 2015-01-09 MED ORDER — IOHEXOL 300 MG/ML  SOLN
25.0000 mL | INTRAMUSCULAR | Status: AC
  Administered 2015-01-09 (×2): 25 mL via ORAL

## 2015-01-09 MED ORDER — SODIUM CHLORIDE 0.9 % IV SOLN
INTRAVENOUS | Status: DC
  Administered 2015-01-09 – 2015-01-11 (×5): via INTRAVENOUS

## 2015-01-09 MED ORDER — CHLORHEXIDINE GLUCONATE 0.12 % MT SOLN
15.0000 mL | Freq: Two times a day (BID) | OROMUCOSAL | Status: DC
  Administered 2015-01-09 – 2015-01-10 (×4): 15 mL via OROMUCOSAL
  Filled 2015-01-09 (×4): qty 15

## 2015-01-09 MED ORDER — CETYLPYRIDINIUM CHLORIDE 0.05 % MT LIQD
7.0000 mL | Freq: Two times a day (BID) | OROMUCOSAL | Status: DC
  Administered 2015-01-09 – 2015-01-10 (×3): 7 mL via OROMUCOSAL

## 2015-01-09 NOTE — Progress Notes (Signed)
Spoke to MD Rizwan r/t  CT results She will review

## 2015-01-09 NOTE — Progress Notes (Signed)
Initial Nutrition Assessment  DOCUMENTATION CODES:  Not applicable  INTERVENTION: - Continue order for Resource Breeze TID, each supplement provides 250 kcal and 9 grams of protein - RD will continue to monitor for needs  NUTRITION DIAGNOSIS:  Increased nutrient needs related to cancer and cancer related treatments as evidenced by estimated needs.  GOAL:  Patient will meet greater than or equal to 90% of their needs  MONITOR:  PO intake, Supplement acceptance, Weight trends, Labs, I & O's  REASON FOR ASSESSMENT:  Malnutrition Screening Tool  ASSESSMENT: 74 y.o. male with h/o prostate cancer in the past, treated with radiation by seed implant. Radiation therapy later developed bladder cancer stage III resected in 2013 with Ileal conduit. On chemotherapy with Alimta currently last dose on 6/17. Patient presents to the ED with c/o diarrhea and fatigue. Symptoms onset Sunday, worsening since then.  Pt seen for MST. BMI indicates normal weight status. Pt has been NPO this AM with plans to advance to [facility] soon per RN; Resource Breeze TID order already in place.  Pt reports decreased appetite PTA and that with chemo he has been having metallic taste alterations with food and drink intakes. Pt states that when he started chemo in April 2015 he weighed 249 lbs and that he has been losing weight since that time. Per weight hx review, pt has lost 8 lbs (4% body weight) in the past 1 month which is not significant for time frame.  Not able to meet needs at this time. Physical assessment does not show muscle or fat wasting. Medications reviewed. Labs reviewed.  Height:  Ht Readings from Last 1 Encounters:  01/08/15 6' (1.829 m)    Weight:  Wt Readings from Last 1 Encounters:  01/08/15 189 lb 13.1 oz (86.1 kg)    Ideal Body Weight:  80.9 kg (kg)  Wt Readings from Last 10 Encounters:  01/08/15 189 lb 13.1 oz (86.1 kg)  12/28/14 191 lb 1.6 oz (86.682 kg)  12/07/14 197 lb 1.6  oz (89.404 kg)  11/16/14 203 lb 8 oz (92.307 kg)  10/26/14 199 lb 1.6 oz (90.311 kg)  10/05/14 196 lb 3.2 oz (88.996 kg)  09/14/14 196 lb 1.6 oz (88.95 kg)  08/24/14 194 lb 14.4 oz (88.406 kg)  07/19/14 198 lb 9.6 oz (90.084 kg)  05/17/14 195 lb 4.8 oz (88.587 kg)    BMI:  Body mass index is 25.74 kg/(m^2).  Estimated Nutritional Needs:  Kcal:  3875-6433  Protein:  85-95 grams  Fluid:  2.5 L/day  Skin:  Reviewed, no issues  Diet Order:  Diet clear liquid Room service appropriate?: Yes; Fluid consistency:: Thin  EDUCATION NEEDS:  No education needs identified at this time   Intake/Output Summary (Last 24 hours) at 01/09/15 1022 Last data filed at 01/09/15 0549  Gross per 24 hour  Intake    920 ml  Output    500 ml  Net    420 ml    Last BM:  PTA     Jarome Matin, RD, LDN Inpatient Clinical Dietitian Pager # [phone] After hours/weekend pager # [phone]

## 2015-01-09 NOTE — Progress Notes (Signed)
IP PROGRESS NOTE  Subjective:   Patient known to me with a long-standing history of advanced bladder cancer. He has been on multiple therapies in the past most recently received Alimta on 12/28/2014. He was hospitalized on 01/08/2015 after presenting with weakness, fatigue and failure to thrive.  Upon admission, he was found to be anemic with hemoglobin drifting back to 6.5 and thrombocytopenia with a count of 41. He was also found to have acute renal failure with creatinine up to 2.58 and now slightly down to 2.36.  He received 2 units of packed red cell transfusion and feeling slightly improved. He still have overall weakness and fatigue and have not try to move out of bed yet. He still has significant pelvic pain related to his tumor.  Objective:  Vital signs in last 24 hours: Temp:  [97.8 F (36.6 C)-98.6 F (37 C)] 97.9 F (36.6 C) (06/29 0549) Pulse Rate:  [70-91] 72 (06/29 0549) Resp:  [16-20] 18 (06/29 0549) BP: (94-126)/(53-63) 111/61 mmHg (06/29 0549) SpO2:  [93 %-100 %] 98 % (06/29 0549) Weight:  [189 lb 13.1 oz (86.1 kg)] 189 lb 13.1 oz (86.1 kg) (06/28 2042) Weight change:  Last BM Date: 01/08/15  Intake/Output from previous day: 06/28 0701 - 06/29 0700 In: 920 [I.V.:250; Blood:670] Out: 500 [Urine:500]  Alert and awake gentleman did not appear in any distress today. Mouth: mucous membranes moist, pharynx normal without lesions Resp: clear to auscultation bilaterally Cardio: regular rate and rhythm, S1, S2 normal, no murmur, click, rub or gallop GI: soft, non-tender; bowel sounds normal; no masses,  no organomegaly Extremities: Lower extremity edema noted.    Lab Results:  Recent Labs  01/08/15 1706 01/09/15 0655  WBC 4.7 4.8  HGB 6.5* 7.2*  HCT 19.5* 21.2*  PLT 52* 41*    BMET  Recent Labs  01/08/15 1706 01/09/15 0655  NA 130* 132*  K 3.8 3.6  CL 100* 105  CO2 19* 19*  GLUCOSE 126* 93  BUN 64* 63*  CREATININE 2.58* 2.36*  CALCIUM 10.2 9.4      Medications: I have reviewed the patient's current medications.  Assessment/Plan:  74 year old gentleman with the following issues:  1. Advanced bladder cancer that have been reasonably refractory to multiple therapies. His most recent treatment was on 12/28/2014 with Alimta. He is scheduled to have a repeat imaging studies on 01/11/2015 which we will reschedule to be done while he is in the hospital. I discussed with him that his tumor could be progressing and this could explain his recent deterioration and health.  If indeed he has cancer progression the option would be palliative care versus a different therapeutic option. New immunotherapy agent Tecentriq) have been approved for bladder cancer he would be a reasonable candidate for if he elects to proceed with more systemic therapy.  Hopefully we can complete the CT scan of the abdomen and pelvis and axillary hours.  2. Anemia and thrombocytopenia: Could be related systemic chemotherapy versus progression of his cancer. He has no active bleeding noted and does not require any platelet transfusion. I agree with packed red cell transfusion to keep his hemoglobin close to 8.  3. Acute renal failure: This could be related to dehydration from prerenal azotemia could also possibly related to hydronephrosis from disease progression in the pelvis. Obtaining CT scan might help with that phenomenon. He might need urology or interventional radiology evaluation if he has indeed hydronephrosis.  4. Lower extremity edema: I agree with ruling out deep vein thrombosis.  5. Prognosis: He understands he has an incurable malignancy has been relatively refractory to treatment. We'll continue to have this discussion with him regarding treatment options in the future.  Appreciate the care of the hospitalist team.   LOS: 1 day   [facility] 01/09/2015, 11:50 AM

## 2015-01-09 NOTE — Progress Notes (Signed)
[facility] Progress Note   Joe Park TSV:[id number] DOB: [birth year] DOA: 01/08/2015 PCP: Donnajean Lopes, MD  Brief narrative: Joe Park is a 74 y.o. male with metastatic bladder cancer on Salvage chemotherapy with Altima.  He presented to the hospital for fatigue and was found to have a hemoglobin of 6.5 and a platelet count of 41 and also found to be in acute renal failure.   Subjective: Patient had no complaints of pain nausea or vomiting. He does not look at his stools and is unable to tell me if he is had any GI bleed recently.  Assessment/Plan: Principal Problem:   Anemia/  Occult blood in stools -Cause uncertain-she is guaiac positive and also received chemotherapy recently although his prior chemotherapy has not caused anemia -He is receive 2 units of packed red blood cells and hemoglobin has improved - will hold off on GI consult until extent of cancer and plan for treatment vs hospice is determined - oral Protonix for now- regular diet  Active Problems:  Thrombocytopenia - possibly from recent Chemo vs cancer progression and involvement of bone marrow    Hypotension -Improved after IV fluids given  Acute renal failure -Continue IV fluids as this is likely prerenal- check CT to look for other causes of renal failure    Bladder cancer/ Status post ileal conduit -Management per Dr. Alen Blew- obtain CT to f/u cancer    H/O prostate cancer - has been treated     Code Status: Full code DVT prophylaxis: SCDs Consultants:Oncology   Antibiotics: Anti-infectives    None      Objective: [facility] Weights   01/08/15 2042  Weight: 86.1 kg (189 lb 13.1 oz)    Intake/Output Summary (Last 24 hours) at 01/09/15 1350 Last data filed at 01/09/15 1024  Gross per 24 hour  Intake    920 ml  Output   1200 ml  Net   -280 ml     Vitals Filed Vitals:   01/09/15 0143 01/09/15 0225 01/09/15 0430 01/09/15 0549  BP: 97/56 99/53 107/55 111/61  Pulse: 73  72 70 72  Temp: 97.9 F (36.6 C) 97.8 F (36.6 C) 97.8 F (36.6 C) 97.9 F (36.6 C)  TempSrc: Oral Oral Oral Oral  Resp: 16 16 20 18   Height:      Weight:      SpO2: 97% 97% 96% 98%    Exam:  General:  Pt is alert, not in acute distress  HEENT: No icterus, No thrush, oral mucosa moist  Cardiovascular: regular rate and rhythm, S1/S2 No murmur  Respiratory: clear to auscultation bilaterally   Abdomen: Soft, +Bowel sounds, non tender, non distended, no guarding  MSK: No LE edema, cyanosis or clubbing  Data Reviewed: Basic Metabolic Panel:  Recent Labs Lab 01/08/15 1706 01/09/15 0655  NA 130* 132*  K 3.8 3.6  CL 100* 105  CO2 19* 19*  GLUCOSE 126* 93  BUN 64* 63*  CREATININE 2.58* 2.36*  CALCIUM 10.2 9.4   Liver Function Tests:  Recent Labs Lab 01/08/15 1706  AST 96*  ALT 77*  ALKPHOS 180*  BILITOT 0.9  PROT 6.8  ALBUMIN 2.5*   No results for input(s): LIPASE, AMYLASE in the last 168 hours. No results for input(s): AMMONIA in the last 168 hours. CBC:  Recent Labs Lab 01/08/15 1706 01/09/15 0655  WBC 4.7 4.8  NEUTROABS 3.7  --   HGB 6.5* 7.2*  HCT 19.5* 21.2*  MCV 85.9 85.8  PLT 52* 41*  Cardiac Enzymes: No results for input(s): CKTOTAL, CKMB, CKMBINDEX, TROPONINI in the last 168 hours. BNP (last 3 results) No results for input(s): BNP in the last 8760 hours.  ProBNP (last 3 results) No results for input(s): PROBNP in the last 8760 hours.  CBG: No results for input(s): GLUCAP in the last 168 hours.  No results found for this or any previous visit (from the past 240 hour(s)).   Studies: No results found.  Scheduled Meds:  Scheduled Meds: . antiseptic oral rinse  7 mL Mouth Rinse q12n4p  . chlorhexidine  15 mL Mouth Rinse BID  . feeding supplement (RESOURCE BREEZE)  1 Container Oral TID BM  . folic acid  1 mg Oral Daily  . iohexol  25 mL Oral Q1 Hr x 2  . levothyroxine  125 mcg Oral QAC breakfast  . pantoprazole (PROTONIX) IV   80 mg Intravenous Q12H  . simvastatin  40 mg Oral QHS   Continuous Infusions: . sodium chloride 125 mL/hr at 01/09/15 1015    Time spent on care of this patient: 35 min   [place], MD 01/09/2015, 1:50 PM  LOS: 1 day   [facility] Office  [phone] Pager - Text Page per [email or web address] If 7PM-7AM, please contact night-coverage [email or web address]

## 2015-01-09 NOTE — Care Management Note (Signed)
Case Management Note  Patient Details  Name: Joe Park MRN: [id number] Date of Birth: 09-30-1940  Subjective/Objective:  Pt admitted with Anemia                  Action/Plan: PT to follow to determine disposition   Expected Discharge Date:   (unknown)               Expected Discharge Plan:  Home/Self Care  In-House Referral:     Discharge planning Services  CM Consult  Post Acute Care Choice:    Choice offered to:     DME Arranged:    DME Agency:     HH Arranged:    HH Agency:     Status of Service:  In process, will continue to follow  Medicare Important Message Given:    Date Medicare IM Given:    Medicare IM give by:    Date Additional Medicare IM Given:    Additional Medicare Important Message give by:     If discussed at [place] of Stay Meetings, dates discussed:    Additional CommentsPurcell Mouton, RN 01/09/2015, 3:30 PM

## 2015-01-09 NOTE — Progress Notes (Signed)
*  Preliminary Results* Right lower extremity venous duplex completed. Visualized veins of the right lower extremity are negative for deep vein thrombosis. There is no evidence of right Baker's cyst.  01/09/2015 2:48 PM  Maudry Mayhew, RVT, RDCS, RDMS

## 2015-01-10 DIAGNOSIS — N179 Acute kidney failure, unspecified: Secondary | ICD-10-CM | POA: Insufficient documentation

## 2015-01-10 DIAGNOSIS — R195 Other fecal abnormalities: Secondary | ICD-10-CM

## 2015-01-10 DIAGNOSIS — Z515 Encounter for palliative care: Secondary | ICD-10-CM | POA: Insufficient documentation

## 2015-01-10 LAB — CBC
HCT: 21 % — ABNORMAL LOW (ref 39.0–52.0)
Hemoglobin: 7.2 g/dL — ABNORMAL LOW (ref 13.0–17.0)
MCH: 29.5 pg (ref 26.0–34.0)
MCHC: 34.3 g/dL (ref 30.0–36.0)
MCV: 86.1 fL (ref 78.0–100.0)
Platelets: 50 10*3/uL — ABNORMAL LOW (ref 150–400)
RBC: 2.44 MIL/uL — ABNORMAL LOW (ref 4.22–5.81)
RDW: 19.3 % — ABNORMAL HIGH (ref 11.5–15.5)
WBC: 6.7 10*3/uL (ref 4.0–10.5)

## 2015-01-10 LAB — BASIC METABOLIC PANEL
ANION GAP: 11 (ref 5–15)
BUN: 46 mg/dL — AB (ref 6–20)
CALCIUM: 9 mg/dL (ref 8.9–10.3)
CO2: 16 mmol/L — ABNORMAL LOW (ref 22–32)
CREATININE: 2.15 mg/dL — AB (ref 0.61–1.24)
Chloride: 104 mmol/L (ref 101–111)
GFR calc Af Amer: 33 mL/min — ABNORMAL LOW (ref >60)
GFR calc non Af Amer: 29 mL/min — ABNORMAL LOW (ref >60)
GLUCOSE: 121 mg/dL — AB (ref 65–99)
POTASSIUM: 3.3 mmol/L — AB (ref 3.5–5.1)
SODIUM: 131 mmol/L — AB (ref 135–145)

## 2015-01-10 LAB — TYPE AND SCREEN
ABO/RH(D): O POS
Antibody Screen: NEGATIVE
Unit division: 0
Unit division: 0

## 2015-01-10 MED ORDER — OXYCODONE HCL 5 MG PO TABS
10.0000 mg | ORAL_TABLET | ORAL | Status: DC | PRN
  Administered 2015-01-10: 10 mg via ORAL
  Filled 2015-01-10: qty 2

## 2015-01-10 MED ORDER — SENNA 8.6 MG PO TABS: 1.0000 | ORAL_TABLET | Freq: Every day | ORAL | Status: DC | PRN

## 2015-01-10 MED ORDER — HYDROMORPHONE HCL 1 MG/ML IJ SOLN: 1.0000 mg | INTRAMUSCULAR | Status: DC | PRN

## 2015-01-10 MED ORDER — ZOLPIDEM TARTRATE 5 MG PO TABS
5.0000 mg | ORAL_TABLET | Freq: Once | ORAL | Status: AC
  Administered 2015-01-10: 5 mg via ORAL
  Filled 2015-01-10: qty 1

## 2015-01-10 MED ORDER — HYDROMORPHONE HCL 2 MG/ML IJ SOLN: 2.0000 mg | INTRAMUSCULAR | Status: DC | PRN

## 2015-01-10 NOTE — Care Management (Signed)
Important Message  Patient Details  Name: Joe Park MRN: [id number] Date of Birth: 1940/08/14   Medicare Important Message Given:  Yes-second notification given    Shelda Altes, LCSW 01/10/2015, 3:22 PM

## 2015-01-10 NOTE — Progress Notes (Signed)
Pt selected [facility] for Home with Hospice. Pt's brother was at bedside. A call to pt's sister Zigmund Daniel [phone], line was busy x2.

## 2015-01-10 NOTE — Progress Notes (Signed)
Palliative care note  Full consult note will follow Patient seen and examined, discussed with patient and POA sister in law [place] at [phone]  PLAN: DNR DNI Hospice consult for home with hospice on D/C PRN IV Dilaudid and PRN PO Oxycodone for 8/10 R thigh pain Comfort measures D/C non essential Po medications.   Loistine Chance, MD Palliative medicine team [facility] health [phone]

## 2015-01-10 NOTE — Progress Notes (Signed)
Notified by Myraette CM of pt/family request for [facility] at home after discharge. Writer spoke with patient at the bedside to initiate education related to hospice philosophy, services and team approach to care. Patient kept falling asleep and permission given to call his sister in law [place]. The same information was reviewed over the phone with Zigmund Daniel and she voiced understanding of the information. Patient will discharge home with his suprapubic catheter and his port a cath in place. Per discussion family is unsure if they can manage getting pt. out of the car at  home and will probably need PTAR for transport home.  Please send signed and completed DNR form home with patient/family. Pt. will need prescriptions for discharge comfort medications.  DME  needs discussed and family requests an elcectric bed, APP and table. Pt. Has a walker in the home. [facility] equipment manager Jewel HUghes notified and will contact [place] to arrange delivery to the home. The home address has been verified and Zigmund Daniel is the family member to contact to arrange delivery time.  [facility] aware of above. Please notify [facility] when pt. Is ready to leave unit at discharge- call [phone] ( or [phone] if after 5 pm) [facility] information and contact numbers have been given to the pt. And Zigmund Daniel by phone call. Above information shared with Myraette,CMRN. Please call with any questions.  [facility] Liaison [phone]

## 2015-01-10 NOTE — Clinical Documentation Improvement (Signed)
"  AKI" is documented in the current medical record.  Please document any baseline or historical lab results or data to support the diagnosis of AKI.   Thank You, Erling Conte ,RN Clinical Documentation Specialist:  [phone] [place] Information Management

## 2015-01-10 NOTE — Progress Notes (Signed)
IP PROGRESS NOTE  Subjective:   Mr. Hengel have uncomfortable might. He still has a lot of pain associated with being in bed. Is able to eat well without any difficulty. His urine output appears adequate.  Objective:  Vital signs in last 24 hours: Temp:  [98 F (36.7 C)-98.6 F (37 C)] 98.6 F (37 C) (06/30 0454) Pulse Rate:  [77-89] 89 (06/30 0454) Resp:  [18-25] 25 (06/30 0454) BP: (105-127)/(49-56) 122/49 mmHg (06/30 0454) SpO2:  [96 %-98 %] 96 % (06/30 0454) Weight change:  Last BM Date: 01/08/15  Intake/Output from previous day: 06/29 0701 - 06/30 0700 In: 3664.8 [P.O.:1196; I.V.:2468.8] Out: 3025 [Urine:3025]   Alert and awake gentleman in some mild distress today. Mouth: mucous membranes moist, pharynx normal without lesions Resp: clear to auscultation bilaterally Cardio: regular rate and rhythm, S1, S2 normal, no murmur, click, rub or gallop GI: soft, non-tender; bowel sounds normal; no masses,  no organomegaly Extremities: Lower extremity edema noted.    Lab Results:  Recent Labs  01/08/15 1706 01/09/15 0655  WBC 4.7 4.8  HGB 6.5* 7.2*  HCT 19.5* 21.2*  PLT 52* 41*    BMET  Recent Labs  01/08/15 1706 01/09/15 0655  NA 130* 132*  K 3.8 3.6  CL 100* 105  CO2 19* 19*  GLUCOSE 126* 93  BUN 64* 63*  CREATININE 2.58* 2.36*  CALCIUM 10.2 9.4   EXAM: CT CHEST, ABDOMEN AND PELVIS WITHOUT CONTRAST  TECHNIQUE: Multidetector CT imaging of the chest, abdomen and pelvis was performed following the standard protocol without IV contrast.  COMPARISON: Multiple exams, including 10/24/2014 and 07/17/2014  FINDINGS: Despite efforts by the technologist and patient, motion artifact is present on today's exam and could not be eliminated. This reduces exam sensitivity and specificity.  CT CHEST FINDINGS  Mediastinum/Nodes: Oral contrast noted in the esophagus. Coronary atherosclerosis. Right Port-A-Cath terminates at  cavoatrial junction.  Lungs/Pleura: Stable medial bulla, left upper lobe. Airway thickening with some airway plugging in the lower lobes. Mucus stranding in the trachea. Subsegmental atelectasis in the posterior basal segments of both lower lobes.  Musculoskeletal: Thoracic scoliosis and spondylosis.  CT ABDOMEN PELVIS FINDINGS  Hepatobiliary: Unremarkable  Pancreas: Unremarkable  Spleen: Unremarkable  Adrenals/Urinary Tract: Bilateral perirenal stranding with right hydronephrosis and hydroureter extending down to the ileal loop. There stones in the ileal loop as well as several nonobstructive right-sided calculi including a several 2 mm right kidney lower pole calculi. There also appear to be some clustered stones in the distal ureter is it enters the ileal loop.  On the left side, there is mild hydronephrosis along with several tiny nonobstructive left renal calculi, largest about 2 mm in the left kidney lower pole.  The hydronephrosis is increased from the prior exam bilaterally.  Stomach/Bowel: The sigmoid colon is essentially inseparable from the perineal tumor, which currently measures proximally 7.5 by 7.3 cm, significantly enlarged from prior.  Vascular/Lymphatic: Aortoiliac atherosclerotic vascular disease.  Reproductive: Prostate gland inseparable from the tumor and is only identifiable by the seed implants.  Other: No supplemental non-categorized findings.  Musculoskeletal: Progressive permeated bony destruction of the pubic rami by tumor. Seed implants along the prostate gland, inseparable from the bottom of the perineal tumor. The tumor infiltrates portions of the hip adductor musculature and extends down towards the scrotum and along the base of the penis. Tumor completely effaces the right obturator canal, and also may be causing some lesser degree of left obturator impingement.  New 20% compression fracture along  the superior endplate  of L1. Stable superior endplate compression fracture at L2.  IMPRESSION: 1. Progressive enlargement of the peroneal tumor with involvement of the prostate gland and extension down into the scrotum along the base of the penis, further invasion of the hip adductor musculature and pelvic sidewalls with complete effacement of the right obturator canal and partial effacement of the left operator canal, and permeative destruction of the pubic rami which could be from both tumor and/or radiation necrosis. 2. Bilateral hydronephrosis, with some clustered calculi in the vicinity of the junction of the ureters with the ileostomy, at least partially obstructive. Bilateral nonobstructive renal calculi are also present. 3. New airway thickening and airway plugging in both lower lobes. 4. The perineal tumor partially engulfs part of the sigmoid colon. 5. New 20% compression fracture along the superior endplate of L1. Stable superior endplate compression fracture at L2. 6. 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 These results will be called to the ordering clinician or representative by the Radiologist Assistant, and communication documented in the PACS or zVision Dashboard  Medications: I have reviewed the patient's current medications.  Assessment/Plan:  74 year old gentleman with the following issues:  1. Advanced bladder cancer that have been reasonably refractory to multiple therapies. His most recent treatment was on 12/28/2014 with Alimta.   CT scan on 01/09/2015 was reviewed and discussed with the patient today. He has clear progression of his cancer despite multiple therapies. Options of therapy were reviewed again with the patient today. These were reviewed with him and his family yesterday as well.  Different salvage  therapy is available but it's extremely unlikely that will work. I do not think you'll offer enough palliation given the extent of his cancer as well as the unique histology of his tumor.  Based on this findings, I recommended hospice care moving forward.  I recommend palliative medicine service involvement to help with pain management as well as goals of care. He probably will need placement upon discharge.  2. Anemia and thrombocytopenia: Could be related systemic chemotherapy versus progression of his cancer. He has no active bleeding noted and does not require any platelet transfusion.   This could be related to progression of his cancer versus cumulative chemotherapy.  Recommend supportive transfusion is needed.  3. Acute renal failure: Bilateral hydronephrosis is noted on the CT scan which is contributing. We can consider percutaneous nephrostomy tubes if his kidney function continued to decline and if he is experiencing pain from his hydronephrosis. I think of this as a more of a palliative measure if needed to.  4. Lower extremity edema: I agree with ruling out deep vein thrombosis.  5. Prognosis: He understands he has an incurable malignancy has been relatively refractory to treatment. Given his recent progression of his cancer, I think he has a limited life expectancy and would qualify for hospice care.   I will discuss this with his family at some point today as they are not available this morning. This was discussed tentatively yesterday and I will repeat it today with them.   LOS: 2 days   [employer] 01/10/2015, 7:44 AM

## 2015-01-10 NOTE — Consult Note (Signed)
Consultation Note Date: 01/10/2015   Patient Name: Joe Park  DOB: [birth year]  MRN: [id number]  Age / Sex: 74 y.o., male   PCP: Leanna Battles, MD Referring Physician: Debbe Odea, MD  Reason for Consultation: Establishing goals of care  Palliative Care Assessment and Plan Summary of Established Goals of Care and Medical Treatment Preferences  Patient is a 74 year old gentleman with a history of metastatic bladder cancer. Patient has been admitted to the hospital for fatigue. Patient has been diagnosed with acute on chronic multifactorial anemia. Hospital course has also been complicated by thrombocytopenia deemed to secondary to recent chemotherapy versus cancer progression. Patient was also found to be in acute renal failure. CT scan revealed progression of cancer. Patient's life limiting illness is bladder cancer he is status post ileal conduit. A palliative consultation has been requested to discuss goals of care  The patient is resting in bed. He has a history of sleep apnea. He falls asleep really easily even in the middle of conversation. When he awakens, and with movement, he has severe pain in his right thigh. He denies any pain in his abdomen or back.  Introduced palliative medicine. Discussed CODE STATUS in detail. Discussed patient's current life limiting illness of metastatic bladder cancer. Patient stated he was aware that his condition was progressing. Discussed DO NOT RESUSCITATE to which the patient agreed upon. Discussed addition of hospice services to which the patient agreed upon. He did state that he would like for his power of attorney sister-in-law K to be notified.  Brief life review performed. Patient states he used to work in a [employer]. He does not have a spouse who does not have any children. He has a brother and his brother's wife who helps take care of him. He lives by himself. His brother and sister-in-law live about a mile away and are very involved in his  activities of daily living. Patient stated he liked to start gaze as a hobby.  Call placed and discussed with patient sister-in-law. She is agreeable to DO NOT RESUSCITATE and hospice consult. Requested that a chaplain come see the patient. She stated that the patient had disability through the [place] which had been discontinued. Discussed with her that the prognosis might could be weeks-months at this point.   Contacts/Participants in Discussion: Primary Decision Maker:  Sister in Designer, industrial/product at [phone]   HCPOA: yes  Patient states he has designated his sister-in-law K as his healthcare agent.  Code Status/Advance Care Planning:  Discussed with the patient in detail. Call placed and discussed with power of attorney sister-in-law Zigmund Daniel. CODE STATUS is now established as DO NOT RESUSCITATE/DO NOT INTUBATE.   Symptom Management:   Dilaudid IV when necessary. Patient also on short-acting oxycodone. Monitor use of IV Dilaudid to initiate dose of long-acting opioid within the next 24 hours.  Palliative Prophylaxis: Yes.  Additional Recommendations (Limitations, Scope, Preferences):  Continue to support the patient and the family as they transition to DO NOT RESUSCITATE, and addition of hospice services. Chaplain consult.  Psycho-social/Spiritual:   Support System: Brother and sister-in-law. Patient does not have a spouse does not have any children.  Desire for further Chaplaincy support:yes. Patient is noted to be very involved with his Craig in [place].  Prognosis: < 6 months  Discharge Planning:  Home with Hospice    Values: shifting towards comfort based approach to care and addition of hospice services.  Life limiting illness: metastatic bladder cancer  PLAN:  DNR  DNI Hospice consult Chaplain consult PRN IV Dilaudid and PRN PO Oxycodone for 8/10 R thigh pain Comfort measures D/C non essential Po medications.      Chief Complaint/History of Present  Illness: fatigue  Primary Diagnoses  Present on Admission:  . AKI (acute kidney injury) . Bladder cancer . Anemia . Thrombocytopenia . Occult blood in stools . Hypotension  Palliative Review of Systems: noted I have reviewed the medical record, interviewed the patient and family, and examined the patient. The following aspects are pertinent.  Past Medical History  Diagnosis Date  . Hypertension   . Elevated cholesterol   . GERD (gastroesophageal reflux disease)   . Bladder tumor   . Difficulty urinating   . Hypothyroidism     UNSURE IF TOO LOW OR TOO HIGH  . Difficulty sleeping   . Speech impediment     SINCE BIRTH  . Sleep apnea     STOP BANG SCORE 5  . Cancer     HX PROSTATE CANCER AND BLADDER CANCER   History   Social History  . Marital Status: Single    Spouse Name: N/A  . Number of Children: N/A  . Years of Education: N/A   Social History Main Topics  . Smoking status: Never Smoker   . Smokeless tobacco: Never Used  . Alcohol Use: No  . Drug Use: No  . Sexual Activity: Not on file   Other Topics Concern  . None   Social History Narrative   No family history on file. Scheduled Meds: . antiseptic oral rinse  7 mL Mouth Rinse q12n4p  . chlorhexidine  15 mL Mouth Rinse BID  . feeding supplement (RESOURCE BREEZE)  1 Container Oral TID BM  . folic acid  1 mg Oral Daily  . levothyroxine  125 mcg Oral QAC breakfast  . pantoprazole  40 mg Oral Daily   Continuous Infusions: . sodium chloride 125 mL/hr at 01/10/15 1026   PRN Meds:.HYDROmorphone (DILAUDID) injection, oxyCODONE, prochlorperazine, senna Medications Prior to Admission:  Prior to Admission medications   Medication Sig Start Date End Date Taking? Authorizing Provider  aspirin 81 MG chewable tablet Chew 81 mg by mouth daily with breakfast.    Yes Historical Provider, MD  folic acid (FOLVITE) 1 MG tablet Take 1 tablet (1 mg total) by mouth daily. 07/19/14  Yes Wyatt Portela, MD    HYDROcodone-acetaminophen (NORCO/VICODIN) 5-325 MG per tablet Take 2 tablets by mouth every 4 (four) hours as needed. Patient taking differently: Take 2 tablets by mouth every 4 (four) hours as needed for moderate pain.  12/28/14  Yes Adrena E Johnson, PA-C  levothyroxine (SYNTHROID, LEVOTHROID) 125 MCG tablet Take 125 mcg by mouth daily with breakfast.    Yes Historical Provider, MD  lidocaine-prilocaine (EMLA) cream Apply 1 application topically as needed. Apply approx 1/2 tsp to skin, over port-a-cath prior to chemotherapy treatments. 12/29/13  Yes Wyatt Portela, MD  Multiple Vitamin (MULITIVITAMIN WITH MINERALS) TABS Take 1 tablet by mouth every morning.   Yes Historical Provider, MD  naproxen sodium (ANAPROX) 220 MG tablet Take 660 mg by mouth 2 (two) times daily as needed (for pain.).   Yes Historical Provider, MD  prochlorperazine (COMPAZINE) 10 MG tablet Take 1 tablet (10 mg total) by mouth every 6 (six) hours as needed for nausea or vomiting. 10/13/13  Yes Maryanna Shape, NP  SENNA PO Take 1 tablet by mouth 2 times daily at 12 noon and 4 pm.   Yes Historical  Provider, MD  simvastatin (ZOCOR) 40 MG tablet Take 40 mg by mouth at bedtime.   Yes Historical Provider, MD  lisinopril (PRINIVIL,ZESTRIL) 20 MG tablet Take 20 mg by mouth daily. 06/30/14   Historical Provider, MD   No Known Allergies CBC:    Component Value Date/Time   WBC 6.7 01/10/2015 0750   WBC 4.9 12/28/2014 1001   HGB 7.2* 01/10/2015 0750   HGB 8.3* 12/28/2014 1001   HCT 21.0* 01/10/2015 0750   HCT 26.0* 12/28/2014 1001   PLT 50* 01/10/2015 0750   PLT 219 12/28/2014 1001   MCV 86.1 01/10/2015 0750   MCV 90.9 12/28/2014 1001   NEUTROABS 3.7 01/08/2015 1706   NEUTROABS 3.3 12/28/2014 1001   LYMPHSABS 0.3* 01/08/2015 1706   LYMPHSABS 0.8* 12/28/2014 1001   MONOABS 0.7 01/08/2015 1706   MONOABS 0.8 12/28/2014 1001   EOSABS 0.0 01/08/2015 1706   EOSABS 0.0 12/28/2014 1001   BASOSABS 0.0 01/08/2015 1706   BASOSABS  0.0 12/28/2014 1001   Comprehensive Metabolic Panel:    Component Value Date/Time   NA 131* 01/10/2015 0750   NA 134* 12/28/2014 1001   K 3.3* 01/10/2015 0750   K 3.7 12/28/2014 1001   CL 104 01/10/2015 0750   CL 106 12/27/2012 0801   CO2 16* 01/10/2015 0750   CO2 22 12/28/2014 1001   BUN 46* 01/10/2015 0750   BUN 22.8 12/28/2014 1001   CREATININE 2.15* 01/10/2015 0750   CREATININE 1.3 12/28/2014 1001   GLUCOSE 121* 01/10/2015 0750   GLUCOSE 178* 12/28/2014 1001   GLUCOSE 108* 12/27/2012 0801   CALCIUM 9.0 01/10/2015 0750   CALCIUM 9.4 12/28/2014 1001   AST 96* 01/08/2015 1706   AST 32 12/28/2014 1001   ALT 77* 01/08/2015 1706   ALT 31 12/28/2014 1001   ALKPHOS 180* 01/08/2015 1706   ALKPHOS 144 12/28/2014 1001   BILITOT 0.9 01/08/2015 1706   BILITOT 0.87 12/28/2014 1001   PROT 6.8 01/08/2015 1706   PROT 7.2 12/28/2014 1001   ALBUMIN 2.5* 01/08/2015 1706   ALBUMIN 2.9* 12/28/2014 1001    Physical Exam: Vital Signs: BP 122/49 mmHg  Pulse 89  Temp(Src) 98.6 F (37 C) (Oral)  Resp 25  Ht 6' (1.829 m)  Wt 86.1 kg (189 lb 13.1 oz)  BMI 25.74 kg/m2  SpO2 96% SpO2: SpO2: 96 % O2 Device: O2 Device: Not Delivered O2 Flow Rate:   Intake/output summary:  Intake/Output Summary (Last 24 hours) at 01/10/15 1343 Last data filed at 01/10/15 0800  Gross per 24 hour  Intake 3904.75 ml  Output   2700 ml  Net 1204.75 ml   LBM: Last BM Date: 01/09/15 Baseline Weight: Weight: 86.1 kg (189 lb 13.1 oz) Most recent weight: Weight: 86.1 kg (189 lb 13.1 oz)  Exam Findings:  Age appropriate appearing gentleman, in mild to moderate distress due to R thigh pain Chest clear S1 S2 Abdomen soft ostomy mild distension no pain Trace edema Patient falls asleep very easily, in pain in hip when awakens. Child like in some mannerisms but is able to provide most of his own history.                        Palliative Performance Scale: 30% Additional Data Reviewed: Recent Labs      01/09/15  0655  01/10/15  0750  WBC  4.8  6.7  HGB  7.2*  7.2*  PLT  41*  50*  NA  132*  131*  BUN  63*  46*  CREATININE  2.36*  2.15*     Time In: 1200  Time Out: 1255 Time Total: 55 min Greater than 50%  of this time was spent counseling and coordinating care related to the above assessment and plan.  Signed by: Loistine Chance, MD [place], MD  01/10/2015, 1:43 PM  Please contact Palliative Medicine Team phone at [phone] for questions and concerns.

## 2015-01-10 NOTE — Evaluation (Signed)
Physical Therapy Evaluation Patient Details Name: Joe Park MRN: [id number] DOB: 07-30-1940 Today's Date: 01/10/2015   History of Present Illness  74 yo male admitted with anemia, CT+rogression of bladder cancer. Hx of prostate cancer, HT, sleep apnea. Pt is from home alone  Clinical Impression  On eval, pt required Mod assist +2 for mobility-only able to tolerate standing at EOB with RW for ~3-4 minutes. R LE pain rated 8/10 with activity. Unable to attempt ambulation on today. Recommend ST rehab at SNF.     Follow Up Recommendations SNF;Supervision/Assistance - 24 hour    Equipment Recommendations  None recommended by PT    Recommendations for Other Services       Precautions / Restrictions Precautions Precautions: Fall Restrictions Weight Bearing Restrictions: No      Mobility  Bed Mobility Overal bed mobility: Needs Assistance Bed Mobility: Supine to Sit     Supine to sit: Mod assist;+2 for physical assistance;+2 for safety/equipment;HOB elevated     General bed mobility comments: Assist for trunk and bil LEs. Increased time. Utilized bedpad for scooting, positioning.   Transfers Overall transfer level: Needs assistance Equipment used: Rolling walker (2 wheeled) Transfers: Sit to/from Stand Sit to Stand: Mod assist;+2 physical assistance;+2 safety/equipment;From elevated surface         General transfer comment: assist to rise, stabilize, control descent. VCs safety, technqiue, hand placement. stood EOB ~3-4 minutes with RW-bowel incontinence-total assist for hygiene.  Ambulation/Gait             General Gait Details: NT-pt unable due to pain  Stairs            Wheelchair Mobility    Modified Rankin (Stroke Patients Only)       Balance Overall balance assessment: Needs assistance         Standing balance support: Bilateral upper extremity supported;During functional activity Standing balance-Leahy Scale: Poor Standing balance  comment: needs RW                             Pertinent Vitals/Pain Pain Assessment: 0-10 Pain Score: 8  Pain Location: R LE with activity Pain Descriptors / Indicators: Aching;Sore Pain Intervention(s): Limited activity within patient's tolerance;Monitored during session;Repositioned    Home Living Family/patient expects to be discharged to:: Unsure Living Arrangements: Alone   Type of Home: House       Home Layout: One level Home Equipment: Environmental consultant - 2 wheels      Prior Function Level of Independence: Independent with assistive device(s)               Hand Dominance        Extremity/Trunk Assessment   Upper Extremity Assessment: Generalized weakness           Lower Extremity Assessment: Generalized weakness      Cervical / Trunk Assessment: Normal  Communication   Communication: Expressive difficulties  Cognition Arousal/Alertness: Awake/alert Behavior During Therapy: WFL for tasks assessed/performed Overall Cognitive Status: Within Functional Limits for tasks assessed                      General Comments      Exercises        Assessment/Plan    PT Assessment Patient needs continued PT services  PT Diagnosis Difficulty walking;Generalized weakness;Acute pain   PT Problem List Decreased strength;Decreased range of motion;Decreased activity tolerance;Decreased balance;Decreased mobility;Decreased knowledge of use of DME;Pain  PT Treatment Interventions  DME instruction;Gait training;Functional mobility training;Therapeutic exercise;Therapeutic activities;Patient/family education;Balance training   PT Goals (Current goals can be found in the Care Plan section) Acute Rehab PT Goals Patient Stated Goal: less pain. home. PT Goal Formulation: With patient Time For Goal Achievement: 01/24/15 Potential to Achieve Goals: Fair    Frequency Min 3X/week   Barriers to discharge        Co-evaluation               End of  Session   Activity Tolerance: Patient limited by pain Patient left: in bed;with call bell/phone within reach           Time: 1224-8250 PT Time Calculation (min) (ACUTE ONLY): 27 min   Charges:   PT Evaluation $Initial PT Evaluation Tier I: 1 Procedure PT Treatments $Therapeutic Activity: 8-22 mins   PT G Codes:        Weston Anna, MPT Pager: [phone]

## 2015-01-10 NOTE — Progress Notes (Addendum)
[facility] Progress Note   Joe Park HTD:[id number] DOB: 1941/02/28 DOA: 01/08/2015 PCP: Donnajean Lopes, MD  Brief narrative: Joe Park is a 74 y.o. male with metastatic bladder cancer on Salvage chemotherapy with [place].  He presented to the hospital for fatigue and was found to have a hemoglobin of 6.5 and a platelet count of 41 and also found to be in acute renal failure.  Subjective: Patient is sleepy today- he is able to explain to me that Dr Alen Blew has told him that the cancer is progressing. He has no complaints of pain.   Assessment/Plan: Principal Problem:   Anemia/  Occult blood in stools - he is guaiac positive and also received chemotherapy recently - may also be due to progression of cancer and involvement of bone marrow - 2 units of packed red blood cells given- hemoglobin has improved - no further work up or treatment as his cancer is progressing despite salvage chemo- recommend palliative care only  Active Problems:  Thrombocytopenia - possibly from recent Chemo vs cancer progression and involvement of bone marrow    Hypotension -Improved after IV fluids given  Acute renal failure -Continue IV fluids as this is likely prerenal- check CT to look for other causes of renal failure    Bladder cancer/ Status post ileal conduit - CT scan reveals progression of cancer- hospice recommended- I have contacted Dr Rowe Pavy to discuss [place]    H/O prostate cancer - has been treated     Code Status: Full code DVT prophylaxis: SCDs Consultants:Oncology   Antibiotics: Anti-infectives    None      Objective: [facility] Weights   01/08/15 2042  Weight: 86.1 kg (189 lb 13.1 oz)    Intake/Output Summary (Last 24 hours) at 01/10/15 1119 Last data filed at 01/10/15 0800  Gross per 24 hour  Intake 3904.75 ml  Output   2700 ml  Net 1204.75 ml     Vitals Filed Vitals:   01/09/15 0549 01/09/15 1424 01/09/15 2147 01/10/15 0454  BP: 111/61 105/56  127/56 122/49  Pulse: 72 77 86 89  Temp: 97.9 F (36.6 C) 98.1 F (36.7 C) 98 F (36.7 C) 98.6 F (37 C)  TempSrc: Oral Oral Oral Oral  Resp: 18 18 20 25   Height:      Weight:      SpO2: 98% 98% 96% 96%    Exam:  General:  Pt is alert, not in acute distress  HEENT: No icterus, No thrush, oral mucosa moist  Cardiovascular: regular rate and rhythm, S1/S2 No murmur  Respiratory: clear to auscultation bilaterally   Abdomen: Soft, +Bowel sounds, non tender, non distended, no guarding  MSK: No LE edema, cyanosis or clubbing  Data Reviewed: Basic Metabolic Panel:  Recent Labs Lab 01/08/15 1706 01/09/15 0655 01/10/15 0750  NA 130* 132* 131*  K 3.8 3.6 3.3*  CL 100* 105 104  CO2 19* 19* 16*  GLUCOSE 126* 93 121*  BUN 64* 63* 46*  CREATININE 2.58* 2.36* 2.15*  CALCIUM 10.2 9.4 9.0   Liver Function Tests:  Recent Labs Lab 01/08/15 1706  AST 96*  ALT 77*  ALKPHOS 180*  BILITOT 0.9  PROT 6.8  ALBUMIN 2.5*   No results for input(s): LIPASE, AMYLASE in the last 168 hours. No results for input(s): AMMONIA in the last 168 hours. CBC:  Recent Labs Lab 01/08/15 1706 01/09/15 0655 01/10/15 0750  WBC 4.7 4.8 6.7  NEUTROABS 3.7  --   --   HGB  6.5* 7.2* 7.2*  HCT 19.5* 21.2* 21.0*  MCV 85.9 85.8 86.1  PLT 52* 41* 50*   Cardiac Enzymes: No results for input(s): CKTOTAL, CKMB, CKMBINDEX, TROPONINI in the last 168 hours. BNP (last 3 results) No results for input(s): BNP in the last 8760 hours.  ProBNP (last 3 results) No results for input(s): PROBNP in the last 8760 hours.  CBG: No results for input(s): GLUCAP in the last 168 hours.  No results found for this or any previous visit (from the past 240 hour(s)).   Studies: Ct Abdomen Pelvis Wo Contrast  01/09/2015   CLINICAL DATA:  Bladder cancer diagnosed in April 2013. Cystectomy. Urostomy. Radiation therapy with seed implants. Prostate cancer in the past. Ileal conduit. Decreased appetite.  EXAM: CT  CHEST, ABDOMEN AND PELVIS WITHOUT CONTRAST  TECHNIQUE: Multidetector CT imaging of the chest, abdomen and pelvis was performed following the standard protocol without IV contrast.  COMPARISON:  Multiple exams, including 10/24/2014 and 07/17/2014  FINDINGS: Despite efforts by the technologist and patient, motion artifact is present on today's exam and could not be eliminated. This reduces exam sensitivity and specificity.  CT CHEST FINDINGS  Mediastinum/Nodes: Oral contrast noted in the esophagus. Coronary atherosclerosis. Right Port-A-Cath terminates at cavoatrial junction.  Lungs/Pleura: Stable medial bulla, left upper lobe. Airway thickening with some airway plugging in the lower lobes. Mucus stranding in the trachea. Subsegmental atelectasis in the posterior basal segments of both lower lobes.  Musculoskeletal: Thoracic scoliosis and spondylosis.  CT ABDOMEN PELVIS FINDINGS  Hepatobiliary: Unremarkable  Pancreas: Unremarkable  Spleen: Unremarkable  Adrenals/Urinary Tract: Bilateral perirenal stranding with right hydronephrosis and hydroureter extending down to the ileal loop. There stones in the ileal loop as well as several nonobstructive right-sided calculi including a several 2 mm right kidney lower pole calculi. There also appear to be some clustered stones in the distal ureter is it enters the ileal loop.  On the left side, there is mild hydronephrosis along with several tiny nonobstructive left renal calculi, largest about 2 mm in the left kidney lower pole.  The hydronephrosis is increased from the prior exam bilaterally.  Stomach/Bowel: The sigmoid colon is essentially inseparable from the perineal tumor, which currently measures proximally 7.5 by 7.3 cm, significantly enlarged from prior.  Vascular/Lymphatic: Aortoiliac atherosclerotic vascular disease.  Reproductive: Prostate gland inseparable from the tumor and is only identifiable by the seed implants.  Other: No supplemental non-categorized findings.   Musculoskeletal: Progressive permeated bony destruction of the pubic rami by tumor. Seed implants along the prostate gland, inseparable from the bottom of the perineal tumor. The tumor infiltrates portions of the hip adductor musculature and extends down towards the scrotum and along the base of the penis. Tumor completely effaces the right obturator canal, and also may be causing some lesser degree of left obturator impingement.  New 20% compression fracture along the superior endplate of L1. Stable superior endplate compression fracture at L2.  IMPRESSION: 1. Progressive enlargement of the peroneal tumor with involvement of the prostate gland and extension down into the scrotum along the base of the penis, further invasion of the hip adductor musculature and pelvic sidewalls with complete effacement of the right obturator canal and partial effacement of the left operator canal, and permeative destruction of the pubic rami which could be from both tumor and/or radiation necrosis. 2. Bilateral hydronephrosis, with some clustered calculi in the vicinity of the junction of the ureters with the ileostomy, at least partially obstructive. Bilateral nonobstructive renal calculi are also present.  3. New airway thickening and airway plugging in both lower lobes. 4. The perineal tumor partially engulfs part of the sigmoid colon. 5. New 20% compression fracture along the superior endplate of L1. Stable superior endplate compression fracture at L2. 6. 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 These results will be called to the ordering clinician or representative by the Radiologist Assistant, and communication documented in the PACS or zVision Dashboard.   Electronically Signed   By: Van Clines M.D.   On: 01/09/2015 16:31   Ct Chest Wo  Contrast  01/09/2015   CLINICAL DATA:  Bladder cancer diagnosed in April 2013. Cystectomy. Urostomy. Radiation therapy with seed implants. Prostate cancer in the past. Ileal conduit. Decreased appetite.  EXAM: CT CHEST, ABDOMEN AND PELVIS WITHOUT CONTRAST  TECHNIQUE: Multidetector CT imaging of the chest, abdomen and pelvis was performed following the standard protocol without IV contrast.  COMPARISON:  Multiple exams, including 10/24/2014 and 07/17/2014  FINDINGS: Despite efforts by the technologist and patient, motion artifact is present on today's exam and could not be eliminated. This reduces exam sensitivity and specificity.  CT CHEST FINDINGS  Mediastinum/Nodes: Oral contrast noted in the esophagus. Coronary atherosclerosis. Right Port-A-Cath terminates at cavoatrial junction.  Lungs/Pleura: Stable medial bulla, left upper lobe. Airway thickening with some airway plugging in the lower lobes. Mucus stranding in the trachea. Subsegmental atelectasis in the posterior basal segments of both lower lobes.  Musculoskeletal: Thoracic scoliosis and spondylosis.  CT ABDOMEN PELVIS FINDINGS  Hepatobiliary: Unremarkable  Pancreas: Unremarkable  Spleen: Unremarkable  Adrenals/Urinary Tract: Bilateral perirenal stranding with right hydronephrosis and hydroureter extending down to the ileal loop. There stones in the ileal loop as well as several nonobstructive right-sided calculi including a several 2 mm right kidney lower pole calculi. There also appear to be some clustered stones in the distal ureter is it enters the ileal loop.  On the left side, there is mild hydronephrosis along with several tiny nonobstructive left renal calculi, largest about 2 mm in the left kidney lower pole.  The hydronephrosis is increased from the prior exam bilaterally.  Stomach/Bowel: The sigmoid colon is essentially inseparable from the perineal tumor, which currently measures proximally 7.5 by 7.3 cm, significantly enlarged from prior.   Vascular/Lymphatic: Aortoiliac atherosclerotic vascular disease.  Reproductive: Prostate gland inseparable from the tumor and is only identifiable by the seed implants.  Other: No supplemental non-categorized findings.  Musculoskeletal: Progressive permeated bony destruction of the pubic rami by tumor. Seed implants along the prostate gland, inseparable from the bottom of the perineal tumor. The tumor infiltrates portions of the hip adductor musculature and extends down towards the scrotum and along the base of the penis. Tumor completely effaces the right obturator canal, and also may be causing some lesser degree of left obturator impingement.  New 20% compression fracture along the superior endplate of L1. Stable superior endplate compression fracture at L2.  IMPRESSION: 1. Progressive enlargement of the peroneal tumor with involvement of the prostate gland and extension down into the scrotum along the base of the penis, further invasion of the hip adductor musculature and pelvic sidewalls with complete effacement of the right obturator canal and partial effacement of the left operator canal, and permeative destruction of the pubic rami which could be from both tumor and/or radiation necrosis. 2. Bilateral hydronephrosis, with some clustered calculi in the vicinity of the junction of the ureters with the ileostomy, at  least partially obstructive. Bilateral nonobstructive renal calculi are also present. 3. New airway thickening and airway plugging in both lower lobes. 4. The perineal tumor partially engulfs part of the sigmoid colon. 5. New 20% compression fracture along the superior endplate of L1. Stable superior endplate compression fracture at L2. 6. 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 These results will be called to the ordering clinician  or representative by the Radiologist Assistant, and communication documented in the PACS or zVision Dashboard.   Electronically Signed   By: Van Clines M.D.   On: 01/09/2015 16:31    Scheduled Meds:  Scheduled Meds: . antiseptic oral rinse  7 mL Mouth Rinse q12n4p  . chlorhexidine  15 mL Mouth Rinse BID  . feeding supplement (RESOURCE BREEZE)  1 Container Oral TID BM  . folic acid  1 mg Oral Daily  . levothyroxine  125 mcg Oral QAC breakfast  . pantoprazole  40 mg Oral Daily  . simvastatin  40 mg Oral QHS   Continuous Infusions: . sodium chloride 125 mL/hr at 01/10/15 1026    Time spent on care of this patient: 35 min   [place], MD 01/10/2015, 11:19 AM  LOS: 2 days   [facility] Office  [phone] Pager - Text Page per [email or web address] If 7PM-7AM, please contact night-coverage [email or web address]

## 2015-01-10 NOTE — Progress Notes (Signed)
CSW received consult for hospice at home at discharge. Per RNCM, Cookie - already taken care of.   No further CSW needs identified - CSW signing off.   Raynaldo Opitz, [facility] Clinical Social Worker cell #: [phone]

## 2015-01-11 ENCOUNTER — Other Ambulatory Visit: Payer: PPO

## 2015-01-11 ENCOUNTER — Telehealth: Payer: Self-pay | Admitting: Oncology

## 2015-01-11 ENCOUNTER — Ambulatory Visit (HOSPITAL_COMMUNITY): Payer: PPO

## 2015-01-11 ENCOUNTER — Other Ambulatory Visit: Payer: Self-pay | Admitting: Oncology

## 2015-01-11 DIAGNOSIS — M25559 Pain in unspecified hip: Secondary | ICD-10-CM | POA: Insufficient documentation

## 2015-01-11 DIAGNOSIS — C7989 Secondary malignant neoplasm of other specified sites: Secondary | ICD-10-CM

## 2015-01-11 DIAGNOSIS — R531 Weakness: Secondary | ICD-10-CM | POA: Insufficient documentation

## 2015-01-11 DIAGNOSIS — M25551 Pain in right hip: Secondary | ICD-10-CM

## 2015-01-11 MED ORDER — PANTOPRAZOLE SODIUM 40 MG PO TBEC: 40.0000 mg | DELAYED_RELEASE_TABLET | Freq: Every day | ORAL | Status: AC

## 2015-01-11 MED ORDER — SENNA 8.6 MG PO TABS: 1.0000 | ORAL_TABLET | Freq: Every day | ORAL | Status: DC | PRN

## 2015-01-11 MED ORDER — ACETAMINOPHEN 325 MG PO TABS: 650.0000 mg | ORAL_TABLET | Freq: Four times a day (QID) | ORAL | Status: AC | PRN

## 2015-01-11 MED ORDER — SENNA 8.6 MG PO TABS: 2.0000 | ORAL_TABLET | Freq: Every day | ORAL | Status: AC

## 2015-01-11 MED ORDER — OXYCODONE HCL 10 MG PO TABS: 5.0000 mg | ORAL_TABLET | ORAL | Status: DC | PRN

## 2015-01-11 MED ORDER — OXYCODONE HCL 10 MG PO TABS: 10.0000 mg | ORAL_TABLET | ORAL | Status: DC | PRN

## 2015-01-11 MED ORDER — OXYCODONE HCL 10 MG PO TABS: 5.0000 mg | ORAL_TABLET | ORAL | Status: AC | PRN

## 2015-01-11 MED ORDER — HYDROCODONE-ACETAMINOPHEN 5-325 MG PO TABS: 2.0000 | ORAL_TABLET | ORAL | Status: DC | PRN

## 2015-01-11 NOTE — Clinical Social Work Placement (Signed)
Patient is set to discharge to [facility] today. Patient & sister-in-law, Zigmund Daniel aware. Discharge packet given to RN, Robert Bellow. [facility] will be called for transport when ready. Awaiting MOST form, Dr. Rowe Pavy paged.     Raynaldo Opitz, [facility] Clinical Social Worker cell #: [phone]    CLINICAL SOCIAL WORK PLACEMENT  NOTE  Date:  01/11/2015  Patient Details  Name: Joe Park MRN: [id number] Date of Birth: 05/02/1941  Clinical Social Work is seeking post-discharge placement for this patient at the [place] level of care (*CSW will initial, date and re-position this form in  chart as items are completed):  Yes   Patient/family provided with [facility] Work Department's list of facilities offering this level of care within the geographic area requested by the patient (or if unable, by the patient's family).  Yes   Patient/family informed of their freedom to choose among providers that offer the needed level of care, that participate in Medicare, Medicaid or managed care program needed by the patient, have an available bed and are willing to accept the patient.  Yes   Patient/family informed of [facility]'s ownership interest in [facility] and [facility], as well as of the fact that they are under no obligation to receive care at these facilities.  PASRR submitted to EDS on 01/11/15     PASRR number received on 01/11/15     Existing PASRR number confirmed on       FL2 transmitted to all facilities in geographic area requested by pt/family on 01/11/15     FL2 transmitted to all facilities within larger geographic area on       Patient informed that his/her managed care company has contracts with or will negotiate with certain facilities, including the following:        Yes   Patient/family informed of bed offers received.  Patient chooses bed at [facility]     Physician recommends and  patient chooses bed at      Patient to be transferred to [facility] on 01/11/15.  Patient to be transferred to facility by [facility]     Patient family notified on 01/11/15 of transfer.  Name of family member notified:  patient's sister-in-law, Zigmund Daniel via phone     PHYSICIAN       Additional Comment:    _______________________________________________ Standley Brooking, LCSW 01/11/2015, 10:50 AM

## 2015-01-11 NOTE — Discharge Summary (Signed)
Physician Discharge Summary  Joe Park WUJ:[id number] DOB: 08-13-1940 DOA: 01/08/2015  PCP: Donnajean Lopes, MD  Admit date: 01/08/2015 Discharge date: 01/11/2015  Time spent: 60 minutes  Code Status: DNR  Recommendations for Outpatient Follow-up:  1. He has been living alone at home and is too weak to return 2. Will be discharged to [facility] with hospice in place  3. 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 male with metastatic bladder cancer on Salvage chemotherapy with [employer].  He presented to the hospital for fatigue and was found to have a hemoglobin of 6.5 and a platelet count of 41 and also found to be in acute renal failure.  Hospital Course:  Principal Problem:  Anemia/ Occult blood in stools - he is guaiac positive - may be due to recent chemotherap - may also be due to progression of cancer and involvement of bone marrow - 2 units of packed red blood cells given- hemoglobin has improved - no further work up or treatment as his cancer is progressing despite salvage chemo- recommend palliative care only - Protonix started for symptom management (to prevent heartburn/ epigastric pain from ulcers/ gastritis)   Active Problems:  Bladder cancer/ Status post ileal conduit - CT scan reveals progression of cancer- see scans below - hospice recommended by oncology and has been arranged with the assistance of our palliative care team.   A-fib with RVR - occurred last night- he was asymptomatic therefore will  no be prescribing any rate controlling agents as we are focusing on symptom management only  Thrombocytopenia - counts in 40-50 range and not improving over past 4 days - possibly from recent Chemo vs cancer progression and involvement of bone marrow   Hypotension -Improved after IV fluids given in ER  Acute renal failure - b/l hydronephrosis in relation to progressive tumor (noted on CT)  - also was taking Lisinopril and Nsaids at home which have been stopped   H/O prostate cancer - has been treated and is in remission  Consultations:  Oncology and palliative care  Discharge Exam: Filed Weights   01/08/15 2042  Weight: 86.1 kg (189 lb 13.1 oz)   Filed Vitals:   01/11/15 0510  BP: 142/67  Pulse: 96  Temp: 98.5 F (36.9 C)  Resp: 28    General: AAO x 3, no distress Cardiovascular: RRR, no murmurs  Respiratory: clear to auscultation bilaterally GI: soft, non-tender, non-distended, bowel sound positive  Discharge Instructions You were cared for by a hospitalist during your hospital stay. If you have any questions about your discharge medications or the care you received while you were in the hospital after you are discharged, you can call the unit and asked to speak with the hospitalist on call if the hospitalist that took care of you is not available. Once you are discharged, your primary care physician will handle any further medical issues. Please note that NO REFILLS for any discharge medications will be authorized once you are discharged, as it is imperative  that you return to your primary care physician (or establish a relationship with a primary care physician if you do not have one) for your aftercare needs so that they can reassess your need for medications and monitor your lab values.      Discharge Instructions    Discharge instructions    Complete by:  As directed   Regular diet as tolerated Activity as tolerated            Medication List    STOP  taking these medications        aspirin 81 MG chewable tablet     HYDROcodone-acetaminophen 5-325 MG per tablet  Commonly known as:  NORCO/VICODIN     lisinopril 20 MG tablet  Commonly known as:  PRINIVIL,ZESTRIL     multivitamin with minerals Tabs tablet     naproxen sodium 220 MG tablet  Commonly known as:  ANAPROX     simvastatin 40 MG tablet  Commonly known as:  ZOCOR      TAKE these medications        acetaminophen 325 MG tablet  Commonly known as:  TYLENOL  Take 2 tablets (650 mg total) by mouth every 6 (six) hours as needed for mild pain, fever or headache.     folic acid 1 MG tablet  Commonly known as:  FOLVITE  Take 1 tablet (1 mg total) by mouth daily.     levothyroxine 125 MCG tablet  Commonly known as:  SYNTHROID, LEVOTHROID  Take 125 mcg by mouth daily with breakfast.     lidocaine-prilocaine cream  Commonly known as:  EMLA  Apply 1 application topically as needed. Apply approx 1/2 tsp to skin, over port-a-cath prior to chemotherapy treatments.     Oxycodone HCl 10 MG Tabs  Take 0.5-1 tablets (5-10 mg total) by mouth every 3 (three) hours as needed (mod to severe pain).     pantoprazole 40 MG tablet  Commonly known as:  PROTONIX  Take 1 tablet (40 mg total) by mouth daily.     prochlorperazine 10 MG tablet  Commonly known as:  COMPAZINE  Take 1 tablet (10 mg total) by mouth every 6 (six) hours as needed for nausea or vomiting.     senna 8.6 MG Tabs tablet  Commonly known as:  SENOKOT  Take 2 tablets (17.2 mg total) by mouth daily. Hold for diarrhea       No Known Allergies    The results of significant diagnostics from this hospitalization (including imaging, microbiology, ancillary and laboratory) are listed below for reference.    Significant Diagnostic Studies: Ct Abdomen Pelvis Wo Contrast  01/09/2015   CLINICAL DATA:  Bladder cancer diagnosed in April 2013. Cystectomy. Urostomy. Radiation therapy with seed implants. Prostate cancer in  the past. Ileal conduit. Decreased appetite.  EXAM: CT CHEST, ABDOMEN AND PELVIS WITHOUT CONTRAST  TECHNIQUE: Multidetector CT imaging of the chest, abdomen and pelvis was performed following the standard protocol without IV contrast.  COMPARISON:  Multiple exams, including 10/24/2014 and 07/17/2014  FINDINGS: Despite efforts by the technologist and patient, motion artifact is present on today's exam and could not be eliminated. This reduces exam sensitivity and specificity.  CT CHEST FINDINGS  Mediastinum/Nodes: Oral contrast noted in the esophagus. Coronary atherosclerosis. Right Port-A-Cath terminates at cavoatrial junction.  Lungs/Pleura: Stable medial bulla, left upper lobe. Airway thickening with some airway plugging in the lower lobes. Mucus stranding in the trachea. Subsegmental atelectasis in the posterior basal segments of both lower lobes.  Musculoskeletal: Thoracic scoliosis and spondylosis.  CT ABDOMEN PELVIS FINDINGS  Hepatobiliary: Unremarkable  Pancreas: Unremarkable  Spleen: Unremarkable  Adrenals/Urinary Tract: Bilateral perirenal stranding with right hydronephrosis and hydroureter extending down to the ileal loop. There stones in the ileal loop as well as several nonobstructive right-sided calculi including a several 2 mm right kidney lower pole calculi. There also appear to be some clustered stones in the distal ureter is it enters the ileal loop.  On the left side, there is mild hydronephrosis along with several tiny nonobstructive left renal calculi, largest about 2 mm in the left kidney lower pole.  The hydronephrosis is increased from the prior exam bilaterally.  Stomach/Bowel: The sigmoid colon is essentially inseparable from the perineal tumor, which currently measures proximally 7.5 by 7.3 cm, significantly enlarged from prior.  Vascular/Lymphatic: Aortoiliac atherosclerotic vascular disease.  Reproductive: Prostate gland inseparable from the tumor and is only identifiable by the seed  implants.  Other: No supplemental non-categorized findings.  Musculoskeletal: Progressive permeated bony destruction of the pubic rami by tumor. Seed implants along the prostate gland, inseparable from the bottom of the perineal tumor. The tumor infiltrates portions of the hip adductor musculature and extends down towards the scrotum and along the base of the penis. Tumor completely effaces the right obturator canal, and also may be causing some lesser degree of left obturator impingement.  New 20% compression fracture along the superior endplate of L1. Stable superior endplate compression fracture at L2.  IMPRESSION: 1. Progressive enlargement of the peroneal tumor with involvement of the prostate gland and extension down into the scrotum along the base of the penis, further invasion of the hip adductor musculature and pelvic sidewalls with complete effacement of the right obturator canal and partial effacement of the left operator canal, and permeative destruction of the pubic rami which could be from both tumor and/or radiation necrosis. 2. Bilateral hydronephrosis, with some clustered calculi in the vicinity of the junction of the ureters with the ileostomy, at least partially obstructive. Bilateral nonobstructive renal calculi are also present. 3. New airway thickening and airway plugging in both lower lobes. 4. The perineal tumor partially engulfs part of the sigmoid colon. 5. New 20% compression fracture along the superior endplate of L1. Stable superior endplate compression fracture at L2. 6. 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 These results will be called to the ordering clinician or representative by the Radiologist Assistant, and communication documented in the PACS or zVision Dashboard.   Electronically Signed   By: Van Clines  M.D.   On: 01/09/2015 16:31   Ct Chest Wo Contrast  01/09/2015   CLINICAL DATA:  Bladder cancer diagnosed in April 2013. Cystectomy. Urostomy. Radiation therapy with seed implants. Prostate cancer in the past. Ileal conduit. Decreased appetite.  EXAM: CT CHEST, ABDOMEN AND PELVIS WITHOUT CONTRAST  TECHNIQUE: Multidetector CT imaging of the chest, abdomen and pelvis was performed following the standard protocol without IV contrast.  COMPARISON:  Multiple exams, including 10/24/2014 and 07/17/2014  FINDINGS: Despite efforts by the technologist and patient, motion artifact is present on today's exam and could not be eliminated. This reduces exam sensitivity and specificity.  CT CHEST FINDINGS  Mediastinum/Nodes: Oral contrast noted in the esophagus. Coronary atherosclerosis. Right Port-A-Cath terminates at cavoatrial junction.  Lungs/Pleura: Stable medial bulla, left upper lobe. Airway thickening with some airway plugging in the lower lobes. Mucus stranding in the trachea. Subsegmental atelectasis  in the posterior basal segments of both lower lobes.  Musculoskeletal: Thoracic scoliosis and spondylosis.  CT ABDOMEN PELVIS FINDINGS  Hepatobiliary: Unremarkable  Pancreas: Unremarkable  Spleen: Unremarkable  Adrenals/Urinary Tract: Bilateral perirenal stranding with right hydronephrosis and hydroureter extending down to the ileal loop. There stones in the ileal loop as well as several nonobstructive right-sided calculi including a several 2 mm right kidney lower pole calculi. There also appear to be some clustered stones in the distal ureter is it enters the ileal loop.  On the left side, there is mild hydronephrosis along with several tiny nonobstructive left renal calculi, largest about 2 mm in the left kidney lower pole.  The hydronephrosis is increased from the prior exam bilaterally.  Stomach/Bowel: The sigmoid colon is essentially inseparable from the perineal tumor, which currently measures proximally 7.5 by 7.3  cm, significantly enlarged from prior.  Vascular/Lymphatic: Aortoiliac atherosclerotic vascular disease.  Reproductive: Prostate gland inseparable from the tumor and is only identifiable by the seed implants.  Other: No supplemental non-categorized findings.  Musculoskeletal: Progressive permeated bony destruction of the pubic rami by tumor. Seed implants along the prostate gland, inseparable from the bottom of the perineal tumor. The tumor infiltrates portions of the hip adductor musculature and extends down towards the scrotum and along the base of the penis. Tumor completely effaces the right obturator canal, and also may be causing some lesser degree of left obturator impingement.  New 20% compression fracture along the superior endplate of L1. Stable superior endplate compression fracture at L2.  IMPRESSION: 1. Progressive enlargement of the peroneal tumor with involvement of the prostate gland and extension down into the scrotum along the base of the penis, further invasion of the hip adductor musculature and pelvic sidewalls with complete effacement of the right obturator canal and partial effacement of the left operator canal, and permeative destruction of the pubic rami which could be from both tumor and/or radiation necrosis. 2. Bilateral hydronephrosis, with some clustered calculi in the vicinity of the junction of the ureters with the ileostomy, at least partially obstructive. Bilateral nonobstructive renal calculi are also present. 3. New airway thickening and airway plugging in both lower lobes. 4. The perineal tumor partially engulfs part of the sigmoid colon. 5. New 20% compression fracture along the superior endplate of L1. Stable superior endplate compression fracture at L2. 6. 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 These results  will be called to the ordering clinician or representative by the Radiologist Assistant, and communication documented in the PACS or zVision Dashboard.   Electronically Signed   By: Van Clines M.D.   On: 01/09/2015 16:31    Microbiology: No results found for this or any previous visit (from the past 240 hour(s)).   Labs: Basic Metabolic Panel:  Recent Labs Lab 01/08/15 1706 01/09/15 0655 01/10/15 0750  NA 130* 132* 131*  K 3.8 3.6 3.3*  CL 100* 105 104  CO2 19* 19* 16*  GLUCOSE 126* 93 121*  BUN 64* 63* 46*  CREATININE 2.58* 2.36* 2.15*  CALCIUM 10.2 9.4 9.0   Liver Function Tests:  Recent Labs Lab 01/08/15 1706  AST 96*  ALT 77*  ALKPHOS 180*  BILITOT 0.9  PROT 6.8  ALBUMIN 2.5*   No results for input(s): LIPASE, AMYLASE in the last 168 hours. No results for input(s): AMMONIA in the last 168 hours. CBC:  Recent Labs Lab 01/08/15 1706 01/09/15 2025  01/10/15 0750  WBC 4.7 4.8 6.7  NEUTROABS 3.7  --   --   HGB 6.5* 7.2* 7.2*  HCT 19.5* 21.2* 21.0*  MCV 85.9 85.8 86.1  PLT 52* 41* 50*   Cardiac Enzymes: No results for input(s): CKTOTAL, CKMB, CKMBINDEX, TROPONINI in the last 168 hours. BNP: BNP (last 3 results) No results for input(s): BNP in the last 8760 hours.  ProBNP (last 3 results) No results for input(s): PROBNP in the last 8760 hours.  CBG: No results for input(s): GLUCAP in the last 168 hours.     SignedDebbe Odea, MD [facility] 01/11/2015, 8:53 AM

## 2015-01-11 NOTE — Progress Notes (Signed)
IP PROGRESS NOTE  Subjective:   Mr. Baines appeared more comfortable this morning. He had a reasonably good night. His pain appeared at least for today is controlled.  Objective:  Vital signs in last 24 hours: Temp:  [98.4 F (36.9 C)-98.6 F (37 C)] 98.5 F (36.9 C) (07/01 0510) Pulse Rate:  [88-109] 96 (07/01 0510) Resp:  [22-28] 28 (07/01 0510) BP: (132-142)/(61-67) 142/67 mmHg (07/01 0510) SpO2:  [96 %-98 %] 97 % (07/01 0510) Weight change:  Last BM Date: 01/09/15  Intake/Output from previous day: 06/30 0701 - 07/01 0700 In: 3365 [P.O.:240; I.V.:3125] Out: 4000 [Urine:4000]   Alert and awake gentleman in some mild distress today. Mouth: mucous membranes moist, pharynx normal without lesions Resp: clear to auscultation bilaterally Cardio: regular rate and rhythm, S1, S2 normal, no murmur, click, rub or gallop GI: soft, non-tender; bowel sounds normal; no masses,  no organomegaly Extremities: No edema noted..    Lab Results:  Recent Labs  01/09/15 0655 01/10/15 0750  WBC 4.8 6.7  HGB 7.2* 7.2*  HCT 21.2* 21.0*  PLT 41* 50*    BMET  Recent Labs  01/09/15 0655 01/10/15 0750  NA 132* 131*  K 3.6 3.3*  CL 105 104  CO2 19* 16*  GLUCOSE 93 121*  BUN 63* 46*  CREATININE 2.36* 2.15*  CALCIUM 9.4 9.0     Assessment/Plan:  74 year old gentleman with the following issues:  1. Advanced bladder cancer that have been reasonably refractory to multiple therapies. His most recent treatment was on 12/28/2014 with Alimta.   CT scan on 01/09/2015 showed a massive progression of disease that is unlikely will respond to any further salvage therapy. I recommended hospice care moving forward and this was discussed with the patient again today.  2. Anemia and thrombocytopenia: Could be related systemic chemotherapy versus progression of his cancer. His counts appear better at this time.  3. Acute renal failure: Bilateral hydronephrosis is noted on the CT scan which  is contributing. His urine output appear adequate with normalization of his kidney function at this time. I will hold off any percutaneous nephrostomy tube Alessi symptomatic double-blind.  4. Prognosis: He understands he has an incurable malignancy has been relatively refractory to treatment. He has limited life expectancy and would qualify for hospice enrollment.  I appreciate the care of the hospitalist team and the input from the palliative medicine services. I agree with their assessment and recommendation plan of hospice enrollment upon discharge. I will discuss this with his brother and sister-in-law or the primary caregivers for this pleasant man.    LOS: 3 days   [facility] 01/11/2015, 7:49 AM

## 2015-01-11 NOTE — Progress Notes (Signed)
Patient converted into AFIB, EKG uploaded and vitals taken. No c/o discomfort or SOB. Doctor paged.

## 2015-01-11 NOTE — Progress Notes (Signed)
Daily Progress Note   Patient Name: Joe Park       Date: 01/11/2015 DOB: [birth year]  Age: 74 y.o. MRN#: [id number] Attending Physician: Debbe Odea, MD Primary Care Physician: Donnajean Lopes, MD Admit Date: 01/08/2015  Reason for Consultation/Follow-up: Establishing goals of care  Subjective:  resting in bed, aware of D/C today  Interval Events: Discussed with POA sister in law Zigmund Daniel, discussed with Dr Wynelle Cleveland and [facility] liaison Lattie Haw.  The patient is weak, unsteady and does not have 24 7 care at home. The patient is to go to SNF for D/C today, he will be enrolled in hospice over there.   Length of Stay: 3 days  Current Medications: Scheduled Meds:  . antiseptic oral rinse  7 mL Mouth Rinse q12n4p  . chlorhexidine  15 mL Mouth Rinse BID  . feeding supplement (RESOURCE BREEZE)  1 Container Oral TID BM  . folic acid  1 mg Oral Daily  . levothyroxine  125 mcg Oral QAC breakfast  . pantoprazole  40 mg Oral Daily    Continuous Infusions:    PRN Meds: HYDROmorphone (DILAUDID) injection, oxyCODONE, prochlorperazine, senna  Palliative Performance Scale: 30%     Vital Signs: BP 142/67 mmHg  Pulse 96  Temp(Src) 98.5 F (36.9 C) (Oral)  Resp 28  Ht 6' (1.829 m)  Wt 86.1 kg (189 lb 13.1 oz)  BMI 25.74 kg/m2  SpO2 97% SpO2: SpO2: 97 % O2 Device: O2 Device: Not Delivered O2 Flow Rate:    Intake/output summary:  Intake/Output Summary (Last 24 hours) at 01/11/15 1256 Last data filed at 01/11/15 0900  Gross per 24 hour  Intake   3680 ml  Output   3625 ml  Net     55 ml   LBM:   Baseline Weight: Weight: 86.1 kg (189 lb 13.1 oz) Most recent weight: Weight: 86.1 kg (189 lb 13.1 oz)  Physical Exam: NAD weak Trace edema Awakens easily           Additional Data Reviewed: Recent Labs     01/09/15  0655  01/10/15  0750  WBC  4.8  6.7  HGB  7.2*  7.2*  PLT  41*  50*  NA  132*  131*  BUN  63*  46*  CREATININE  2.36*  2.15*     Problem List:  Patient  Active Problem List   Diagnosis Date Noted  . Acute kidney injury   . Encounter for palliative care   . AKI (acute kidney injury) 01/08/2015  . Thrombocytopenia 01/08/2015  . Occult blood in stools 01/08/2015  . Anemia 12/01/2011    Class: Acute  . Fever 11/30/2011    Class: Acute  . Dizziness - light-headed 11/30/2011    Class: Acute  . Hypotension 11/30/2011    Class: Acute  . Status post ileal conduit 11/30/2011    Class: Chronic  . Urinary tract infection 11/30/2011    Class: Acute  . Hypothyroidism 11/30/2011    Class: Chronic  . Hyperlipidemia 11/30/2011    Class: Chronic  . 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 Obstructive sleep apnea 11/30/2011    Class: Chronic  . GERD (gastroesophageal reflux disease) 11/30/2011    Class: Chronic  . H/O prostate cancer 11/30/2011    Class: Chronic     Palliative Care Assessment & Plan    Code Status:  DNR  Goals of Care:   D/C to SNF with hospice  Desire  for further Chaplaincy support:no  3. Symptom Management:   addressed.  4. Palliative Prophylaxis:  Stool Softener: yes  5. Prognosis: < 6 months  5. Discharge Planning: [place] with Hospice      Thank you for allowing the Palliative Medicine Team to assist in the care of this patient.   Time In: 1200 Time Out: 1225 Total Time 25 Prolonged Time Billed  no     Greater than 50%  of this time was spent counseling and coordinating care related to the above assessment and plan.   Loistine Chance, MD  01/11/2015, 12:56 PM  [phone]  Please contact Palliative Medicine Team phone at [phone] for questions and concerns.

## 2015-01-11 NOTE — Progress Notes (Signed)
Report called to RN at [facility]. Discharge reviewed and all questions answered. K. Kinslee Dalpe RN   

## 2015-01-11 NOTE — Progress Notes (Signed)
TC received from Dr. Rowe Pavy and pt's sister in law. The family states due to his decline and that he lives alone that they are unable to provide 24 hour care for him.  The hospital social worker is currently looking for SNF placement for this patient. [facility] advised of change to SNF. Please call with any questions.Hospice will admit pt. at new facility after he discharges from the hospital.   [facility] Liaison [phone]

## 2015-01-11 NOTE — Clinical Social Work Note (Signed)
Clinical Social Work Assessment  Patient Details  Name: Joe Park MRN: [id number] Date of Birth: [birth year]  Date of referral:  01/11/15               Reason for consult:  Facility Placement                Permission sought to share information with:  Chartered certified accountant granted to share information::  Yes, Verbal Permission Granted  Name::        Agency::     Relationship::     Contact Information:     Housing/Transportation Living arrangements for the past 2 months:  Single Family Home Source of Information:  Patient (sister-in-law, Building services engineer via phone) Patient Interpreter Needed:  None Criminal Activity/Legal Involvement Pertinent to Current Situation/Hospitalization:  No - Comment as needed Significant Relationships:  Siblings Lives with:  Self Do you feel safe going back to the place where you live?  No Need for family participation in patient care:  Yes (Comment)  Care giving concerns:  CSW reviewed PT evaluation recommending SNF at discharge.    Social Worker assessment / plan:  CSW spoke with patient & sister-in-law, Zigmund Daniel via phone to confirm plans for SNF at discharge before returning home.   Employment status:  Retired Nurse, adult PT Recommendations:  [place] / Referral to community resources:  [place]  Patient/Family's Response to care:  Patient & sister-in-law are very much in agreement with plan for SNF, requesting [facility]. CSW awaiting call back from [facility] re: bed availability.   Patient/Family's Understanding of and Emotional Response to Diagnosis, Current Treatment, and Prognosis:  Patient states that he was admitted to the hospital for weakness and is looking forward to participating with rehab at SNF to get stronger and go home.   Emotional Assessment Appearance:  Appears stated age Attitude/Demeanor/Rapport:    Affect (typically observed):  Pleasant,  Accepting, Happy Orientation:  Oriented to Self, Oriented to Place, Oriented to  Time, Oriented to Situation Alcohol / Substance use:    Psych involvement (Current and /or in the community):     Discharge Needs  Concerns to be addressed:    Readmission within the last 30 days:    Current discharge risk:    Barriers to Discharge:      Standley Brooking, LCSW 01/11/2015, 9:56 AM

## 2015-01-11 NOTE — Clinical Social Work Placement (Signed)
   CLINICAL SOCIAL WORK PLACEMENT  NOTE  Date:  01/11/2015  Patient Details  Name: Joe Park MRN: [id number] Date of Birth: 01-11-1941  Clinical Social Work is seeking post-discharge placement for this patient at the [place] level of care (*CSW will initial, date and re-position this form in  chart as items are completed):  Yes   Patient/family provided with [facility] Work Department's list of facilities offering this level of care within the geographic area requested by the patient (or if unable, by the patient's family).  Yes   Patient/family informed of their freedom to choose among providers that offer the needed level of care, that participate in Medicare, Medicaid or managed care program needed by the patient, have an available bed and are willing to accept the patient.  Yes   Patient/family informed of [facility]'s ownership interest in [facility] and [facility], as well as of the fact that they are under no obligation to receive care at these facilities.  PASRR submitted to [employer] on 01/11/15     PASRR number received on 01/11/15     Existing PASRR number confirmed on       FL2 transmitted to all facilities in geographic area requested by pt/family on 01/11/15     FL2 transmitted to all facilities within larger geographic area on       Patient informed that his/her managed care company has contracts with or will negotiate with certain facilities, including the following:        Yes   Patient/family informed of bed offers received.  Patient chooses bed at [facility]     Physician recommends and patient chooses bed at      Patient to be transferred to   on  .  Patient to be transferred to facility by       Patient family notified on   of transfer.  Name of family member notified:        PHYSICIAN       Additional Comment:    _______________________________________________ Standley Brooking,  LCSW 01/11/2015, 9:57 AM

## 2015-01-11 NOTE — Telephone Encounter (Signed)
Lft msg for pt confirming MD/chemo has been cancelled on 07/07 per 07/01 POF.... Cherylann Banas

## 2015-01-15 ENCOUNTER — Other Ambulatory Visit: Payer: PPO

## 2015-01-17 ENCOUNTER — Ambulatory Visit: Payer: PPO

## 2015-01-17 ENCOUNTER — Ambulatory Visit: Payer: PPO | Admitting: Oncology

## 2015-02-11 DEATH — deceased

## 2015-10-29 IMAGING — CT CT CHEST W/ CM
2 of 7 series · 15 of 46 positions shown, 17 images · IV contrast (OMNIPAQUE)
Comparison: CTs 07/17/2014 and 03/21/2014.

CLINICAL DATA: Restaging bladder cancer diagnosed in 8364. Status
post cystectomy. Remote history of prostate cancer. Subsequent
encounter.

EXAM:
CT CHEST, ABDOMEN, AND PELVIS WITH CONTRAST
TECHNIQUE: Multidetector CT imaging of the chest, abdomen and pelvis was
performed following the standard protocol during bolus
administration of intravenous contrast.
CONTRAST:  125mL OMNIPAQUE IOHEXOL 300 MG/ML  SOLN

[Series 2: images with contrast · axial · 0.83mm/px · z∈[+704,+1269]mm · 12 of 133 slices shown, 14 images]
[im 10/133  soft-tissue]
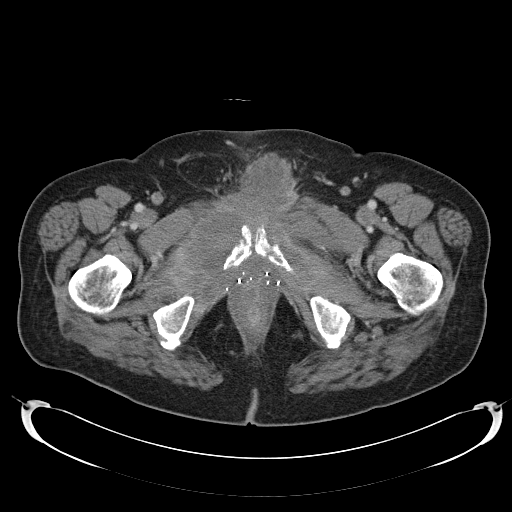
[im 10/133  bone]
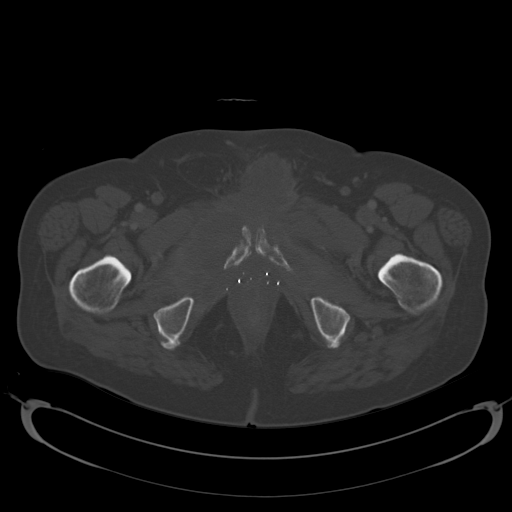
[im 19/133  soft-tissue]
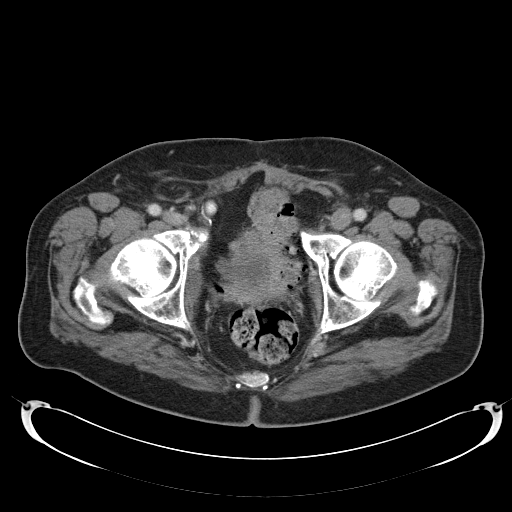
[im 29/133  soft-tissue]
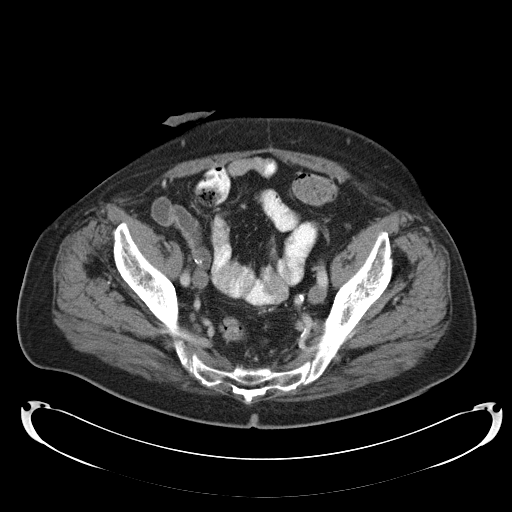
[im 38/133  soft-tissue]
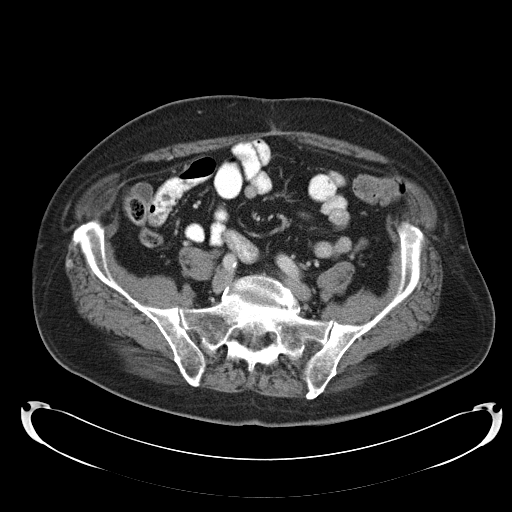
[im 48/133  soft-tissue]
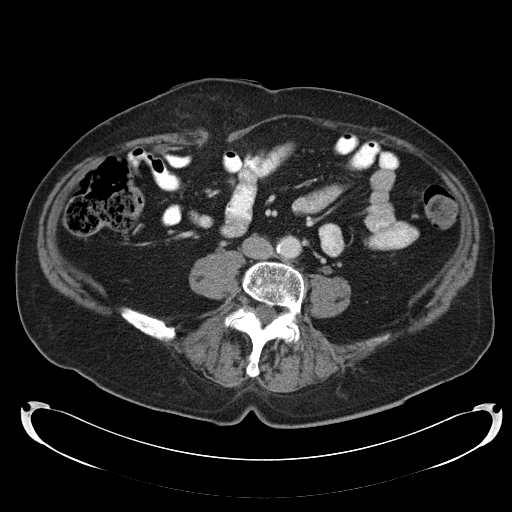
[im 57/133  soft-tissue]
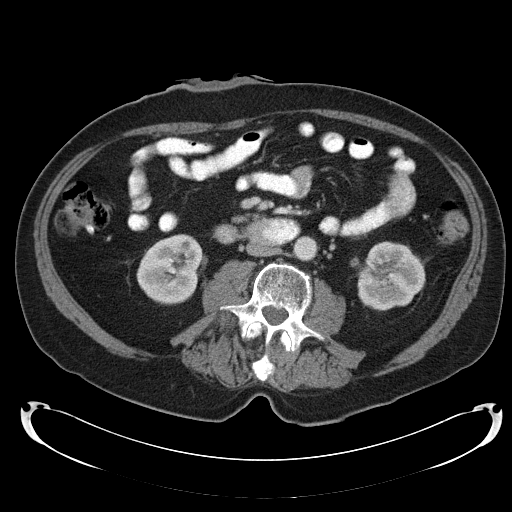
[im 76/133  soft-tissue]
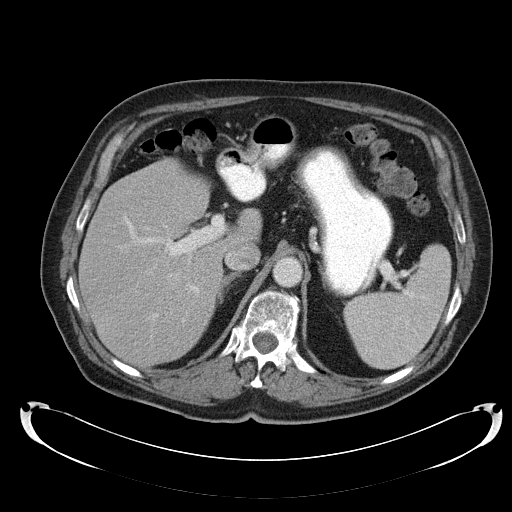
[im 85/133  soft-tissue]
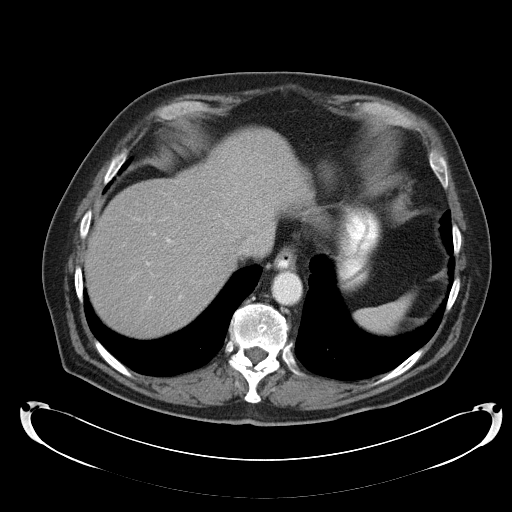
[im 95/133  soft-tissue]
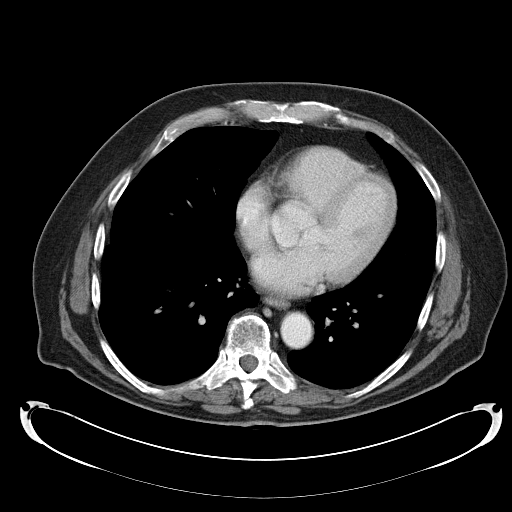
[im 95/133  bone]
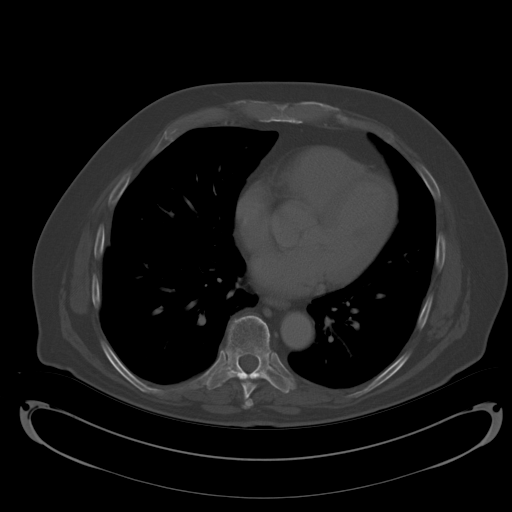
[im 104/133  soft-tissue]
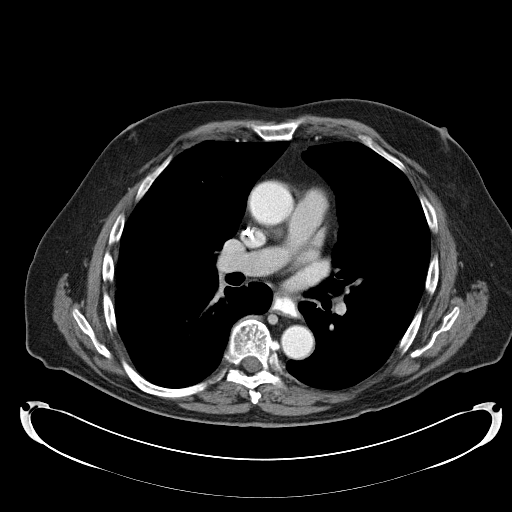
[im 114/133  soft-tissue]
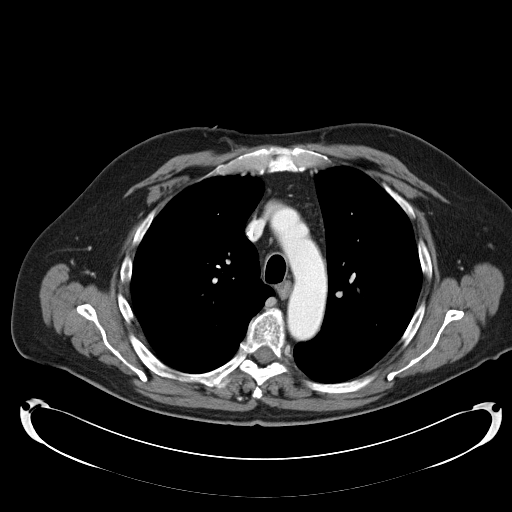
[im 123/133  soft-tissue]
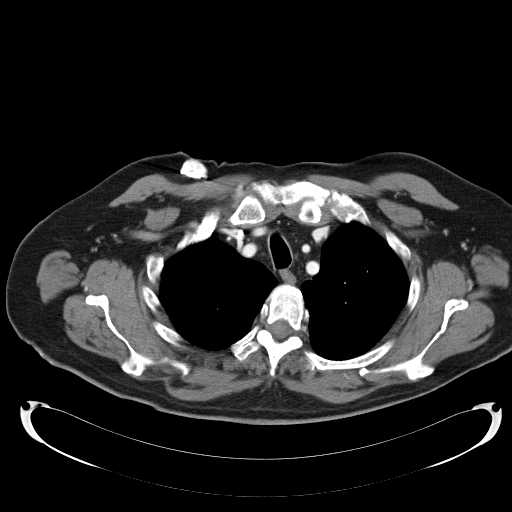

[Series 603: <mpr thick range> · coronal · 1.30mm/px · 3 of 107 slices shown]
[im 27/107  soft-tissue]
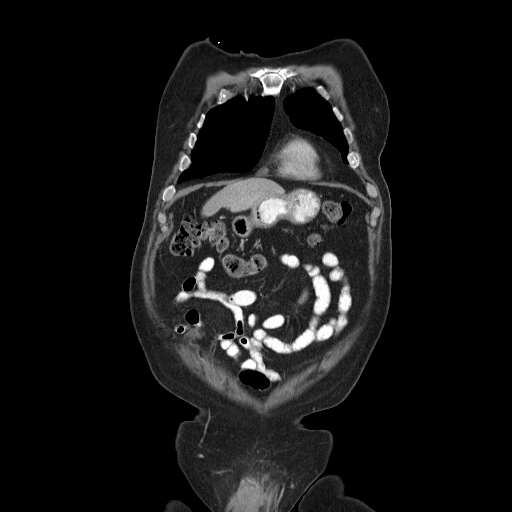
[im 54/107  soft-tissue]
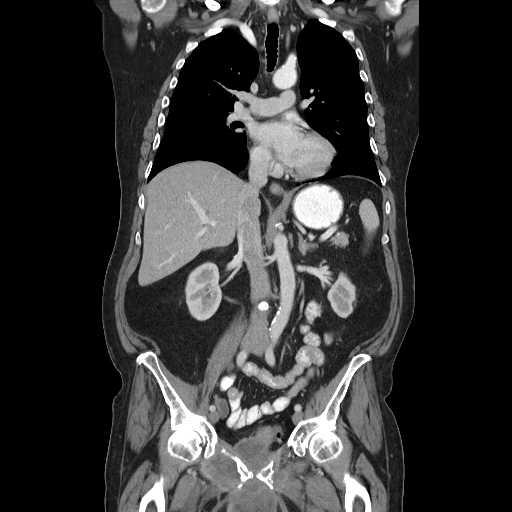
[im 80/107  soft-tissue]
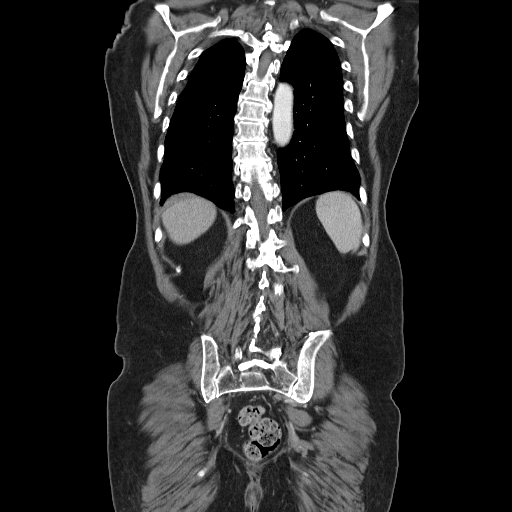

[15 of 46 positions shown; findings below may reference images not displayed]

FINDINGS: CT CHEST FINDINGS

Mediastinum/Nodes: There are no enlarged mediastinal, hilar or
axillary lymph nodes. The thyroid gland, trachea and esophagus
demonstrate no significant findings. The heart size is normal. There
is trace pericardial fluid.Right IJ Port-A-Cath tip in the lower
SVC. Stable mild atherosclerosis of the aorta, great vessels and
coronary arteries.

Lungs/Pleura: There is no pleural effusion.4 mm perifissural nodule
along the inferior aspect of the left major fissure on image 35 is
stable. The lungs are otherwise clear without suspicious nodules.

Musculoskeletal/Chest wall: No evidence of chest wall mass or
suspicious osseous finding.

CT ABDOMEN AND PELVIS FINDINGS

Hepatobiliary: Stable 8 mm focus of hyper enhancement in the dome of
the right hepatic lobe on image 46, likely a small hemangioma or
other incidental vascular finding. No new or enlarging liver
lesions. No evidence of gallstones, gallbladder wall thickening or
biliary dilatation.

Pancreas: Unremarkable. No pancreatic ductal dilatation or
surrounding inflammatory changes.

Spleen: Normal in size without focal abnormality.

Adrenals/Urinary Tract: The adrenal glands appear stable without
suspicious findings.There is a stable small nonobstructing calculus
in the lower pole of the right kidney. Post-contrast, small right
renal cysts are stable. There is no significant ureteral dilatation
status post cystectomy and urinary diversion. The ileal conduit
appears stable with stable parastomal herniation of omental fat.

Stomach/Bowel: No evidence of bowel wall thickening, distention or
surrounding inflammatory change.

Vascular/Lymphatic: There are no enlarged abdominal or pelvic lymph
nodes. Stable aortoiliac atherosclerosis.

Reproductive: Prostate brachytherapy seeds are noted status post
cystectomy. There has been progressive enlargement of a large
tetrafoil-shaped pelvic mass. This has a component extending
superior to the prostate gland, measuring 6.0 x 5.2 cm. There is
bilateral extension through the obturator foramina, worse on the
right where there is a 7.8 cm component on image 124. There is also
significant tumor extending along the base of the penis into the
anterior perineum, measuring up to 11.9 x 7.8 cm transverse on image
131. The inferior extent of the tumor is not imaged. On the coronal
images, tumor measures at least 10.5 x 13.1 cm (image 60).

Other: Stable asymmetric fat in the right inguinal canal.

Musculoskeletal: Again demonstrated are bilateral pubic rami
fractures, mildly displaced on the right. Both rami demonstrate
progressive ill-defined lysis which may be related to previous
pelvic radiation and subsequent pathologic fractures. Direct
intraosseous extension of tumor from the adjacent pelvic mass is
difficult to exclude, although not strongly favored.
IMPRESSION: 1. Progressive enlargement of large tetrafoil pelvic mass consistent
with local recurrence of tumor, presumably from the patient's
bladder cancer. Tumor extends into both obturator foramina and the
perineum.
2. The ureters and ileal conduit are patent.
3. No distant metastases identified.
4. Progressive lysis of the pubic bones with associated bilateral
pathologic/insufficiency fractures.

## 2016-07-29 ENCOUNTER — Other Ambulatory Visit: Payer: Self-pay | Admitting: Nurse Practitioner
